# Patient Record
Sex: Female | Born: 1950 | Race: White | Hispanic: No | Smoking: Never smoker
Health system: Southern US, Community
[De-identification: ages and names within clinical notes are randomized; demographics above are authoritative.]

## PROBLEM LIST (undated history)

## (undated) DIAGNOSIS — I10 Essential (primary) hypertension: Secondary | ICD-10-CM

## (undated) DIAGNOSIS — I639 Cerebral infarction, unspecified: Secondary | ICD-10-CM

## (undated) DIAGNOSIS — M6740 Ganglion, unspecified site: Secondary | ICD-10-CM

## (undated) DIAGNOSIS — R569 Unspecified convulsions: Secondary | ICD-10-CM

## (undated) DIAGNOSIS — E119 Type 2 diabetes mellitus without complications: Secondary | ICD-10-CM

## (undated) HISTORY — PX: TONSILLECTOMY: SUR1361

## (undated) HISTORY — PX: ABDOMINAL HYSTERECTOMY: SHX81

## (undated) HISTORY — PX: APPENDECTOMY: SHX54

---

## 2015-02-26 ENCOUNTER — Emergency Department (HOSPITAL_BASED_OUTPATIENT_CLINIC_OR_DEPARTMENT_OTHER)
Admission: EM | Admit: 2015-02-26 | Discharge: 2015-02-26 | Disposition: A | Discharge status: Home / Self Care | Payer: Medicare Other | Attending: Emergency Medicine | Admitting: Emergency Medicine

## 2015-02-26 ENCOUNTER — Emergency Department (HOSPITAL_BASED_OUTPATIENT_CLINIC_OR_DEPARTMENT_OTHER): Payer: Medicare Other

## 2015-02-26 ENCOUNTER — Encounter (HOSPITAL_BASED_OUTPATIENT_CLINIC_OR_DEPARTMENT_OTHER): Payer: Self-pay | Admitting: *Deleted

## 2015-02-26 DIAGNOSIS — W01198A Fall on same level from slipping, tripping and stumbling with subsequent striking against other object, initial encounter: Secondary | ICD-10-CM | POA: Diagnosis not present

## 2015-02-26 DIAGNOSIS — Y9289 Other specified places as the place of occurrence of the external cause: Secondary | ICD-10-CM | POA: Insufficient documentation

## 2015-02-26 DIAGNOSIS — I10 Essential (primary) hypertension: Secondary | ICD-10-CM | POA: Diagnosis not present

## 2015-02-26 DIAGNOSIS — Z880 Allergy status to penicillin: Secondary | ICD-10-CM | POA: Diagnosis not present

## 2015-02-26 DIAGNOSIS — S7001XA Contusion of right hip, initial encounter: Secondary | ICD-10-CM | POA: Diagnosis not present

## 2015-02-26 DIAGNOSIS — Z7902 Long term (current) use of antithrombotics/antiplatelets: Secondary | ICD-10-CM

## 2015-02-26 DIAGNOSIS — D691 Qualitative platelet defects: Secondary | ICD-10-CM | POA: Insufficient documentation

## 2015-02-26 DIAGNOSIS — Z79899 Other long term (current) drug therapy: Secondary | ICD-10-CM | POA: Insufficient documentation

## 2015-02-26 DIAGNOSIS — Z8739 Personal history of other diseases of the musculoskeletal system and connective tissue: Secondary | ICD-10-CM | POA: Diagnosis not present

## 2015-02-26 DIAGNOSIS — Z8673 Personal history of transient ischemic attack (TIA), and cerebral infarction without residual deficits: Secondary | ICD-10-CM | POA: Insufficient documentation

## 2015-02-26 DIAGNOSIS — S0003XA Contusion of scalp, initial encounter: Secondary | ICD-10-CM | POA: Diagnosis not present

## 2015-02-26 DIAGNOSIS — S0990XA Unspecified injury of head, initial encounter: Secondary | ICD-10-CM | POA: Diagnosis present

## 2015-02-26 DIAGNOSIS — S20221A Contusion of right back wall of thorax, initial encounter: Secondary | ICD-10-CM | POA: Diagnosis not present

## 2015-02-26 DIAGNOSIS — Y998 Other external cause status: Secondary | ICD-10-CM | POA: Insufficient documentation

## 2015-02-26 DIAGNOSIS — Z794 Long term (current) use of insulin: Secondary | ICD-10-CM | POA: Insufficient documentation

## 2015-02-26 DIAGNOSIS — W19XXXA Unspecified fall, initial encounter: Secondary | ICD-10-CM

## 2015-02-26 DIAGNOSIS — R296 Repeated falls: Secondary | ICD-10-CM

## 2015-02-26 DIAGNOSIS — S161XXA Strain of muscle, fascia and tendon at neck level, initial encounter: Secondary | ICD-10-CM | POA: Insufficient documentation

## 2015-02-26 DIAGNOSIS — Y9389 Activity, other specified: Secondary | ICD-10-CM | POA: Diagnosis not present

## 2015-02-26 DIAGNOSIS — E119 Type 2 diabetes mellitus without complications: Secondary | ICD-10-CM | POA: Diagnosis not present

## 2015-02-26 HISTORY — DX: Type 2 diabetes mellitus without complications: E11.9

## 2015-02-26 HISTORY — DX: Cerebral infarction, unspecified: I63.9

## 2015-02-26 HISTORY — DX: Essential (primary) hypertension: I10

## 2015-02-26 HISTORY — DX: Ganglion, unspecified site: M67.40

## 2015-02-26 HISTORY — DX: Unspecified convulsions: R56.9

## 2015-02-26 NOTE — ED Notes (Signed)
Patient assisted with preparing for discharge.

## 2015-02-26 NOTE — ED Notes (Signed)
She tripped and fell this am. She lives in an assisted living and uses a walker to ambulate. She hit the back of her head and has a hematoma. She is on blood thinners. She is alert oriented. Pain in her lower back and numbness in her right leg.

## 2015-02-26 NOTE — ED Provider Notes (Signed)
CSN: [id number]     Arrival date & time 02/26/15  1411 History   First MD Initiated Contact with Patient 02/26/15 1531     Chief Complaint  Patient presents with  . Head Injury     (Consider location/radiation/quality/duration/timing/severity/associated sxs/prior Treatment) HPI Patient has had frequent falls. She is living in an assisted living. Her falls often occur during the night when she tried use of aspirin. That happened last night at about 2 AM. EMS came to the site but the patient did not wish to have transport. She was evaluated and had not had loss of consciousness and was alert and oriented. This morning however due to the patient being on Plavix, the nursing facility did request she would seek evaluation. She reports she has a tender swelling on the back of her head and some pain in her right hip. She reports it has felt more difficult to walk but she has been able to move it and to transfer her weight. Past Medical History  Diagnosis Date  . Diabetes mellitus without complication (HCC)   . Ganglion cyst   . Hypertension   . Stroke (HCC)   . Seizures [facility])    Past Surgical History  Procedure Laterality Date  . Abdominal hysterectomy    . Appendectomy    . Tonsillectomy     No family history on file. Social History  Substance Use Topics  . Smoking status: Never Smoker   . Smokeless tobacco: None  . Alcohol Use: No   OB History    No data available     Review of Systems 10 Systems reviewed and are negative for acute change except as noted in the HPI.    Allergies  Mercury and Penicillins  Home Medications   Prior to Admission medications   Medication Sig Start Date End Date Taking? Authorizing Provider  ALPRAZolam Prudy Feeler) 0.5 MG tablet Take 0.5 mg by mouth at bedtime as needed for anxiety.   Yes Historical Provider, MD  buPROPion (WELLBUTRIN SR) 150 MG 12 hr tablet Take 150 mg by mouth 2 (two) times daily.   Yes Historical Provider, MD  carvedilol (COREG)  12.5 MG tablet Take 12.5 mg by mouth 2 (two) times daily with a meal.   Yes Historical Provider, MD  clopidogrel (PLAVIX) 75 MG tablet Take 75 mg by mouth daily.   Yes Historical Provider, MD  docusate sodium (COLACE) 100 MG capsule Take 100 mg by mouth 2 (two) times daily.   Yes Historical Provider, MD  DULoxetine HCl (CYMBALTA PO) Take by mouth.   Yes Historical Provider, MD  esomeprazole (NEXIUM) 20 MG capsule Take 20 mg by mouth daily at 12 noon.   Yes Historical Provider, MD  ezetimibe (ZETIA) 10 MG tablet Take 10 mg by mouth daily.   Yes Historical Provider, MD  folic acid (FOLVITE) 1 MG tablet Take 1 mg by mouth daily.   Yes Historical Provider, MD  insulin aspart (NOVOLOG) 100 UNIT/ML injection Inject into the skin 3 (three) times daily before meals.   Yes Historical Provider, MD  insulin glargine (LANTUS) 100 UNIT/ML injection Inject into the skin at bedtime.   Yes Historical Provider, MD  LamoTRIgine (LAMICTAL PO) Take by mouth.   Yes Historical Provider, MD  lisinopril (PRINIVIL,ZESTRIL) 10 MG tablet Take 10 mg by mouth daily.   Yes Historical Provider, MD  Methotrexate, PF, 25 MG/0.5ML SOAJ Inject into the skin.   Yes Historical Provider, MD  morphine (MSIR) 15 MG tablet Take 15 mg  by mouth every 4 (four) hours as needed for severe pain.   Yes Historical Provider, MD  NYSTATIN PO Take by mouth.   Yes Historical Provider, MD  polyethylene glycol (MIRALAX / GLYCOLAX) packet Take 17 g by mouth daily.   Yes Historical Provider, MD  pregabalin (LYRICA) 50 MG capsule Take 50 mg by mouth 3 (three) times daily.   Yes Historical Provider, MD  traZODone (DESYREL) 100 MG tablet Take 100 mg by mouth at bedtime.   Yes Historical Provider, MD   There were no vitals taken for this visit. Physical Exam  Constitutional: She is oriented to person, place, and time.  Patient is alert and nontoxic. No respiratory distress. He is mildly deconditioned and has mild obesity.  HENT:  Right Ear: External ear  normal.  Left Ear: External ear normal.  Nose: Nose normal.  Mouth/Throat: Oropharynx is clear and moist.  2 cm hematoma to the right posterior scalp. No laceration.  Eyes: EOM are normal. Pupils are equal, round, and reactive to light.  Neck: Neck supple.  Cervical spine tender at the base of the skull close to the area of hematoma.  Cardiovascular: Normal rate, regular rhythm, normal heart sounds and intact distal pulses.   Pulmonary/Chest: Effort normal and breath sounds normal.  Thoracic back has a linear approximately 15 cm purple, obliquely oriented contusion. This is not significant tender. Patient also has a large area of deep ecchymoses around the posterior, right under arm. Again no significant tenderness. Patient was not aware of these areas of bruising.  Abdominal: Soft. Bowel sounds are normal. She exhibits no distension. There is no tenderness.  Musculoskeletal: Normal range of motion. She exhibits tenderness. She exhibits no edema.  Patient endorses tenderness to palpation over the right greater trochanter. Range of motion is however intact.  Neurological: She is alert and oriented to person, place, and time. She has normal strength. No cranial nerve deficit. She exhibits normal muscle tone. Coordination normal. GCS eye subscore is 4. GCS verbal subscore is 5. GCS motor subscore is 6.  Skin: Skin is warm, dry and intact.  Psychiatric: She has a normal mood and affect.    ED Course  Procedures (including critical care time) Labs Review Labs Reviewed - No data to display  Imaging Review Ct Head Wo Contrast  02/26/2015  CLINICAL DATA:  Fall on Plavix with posterior head injury. EXAM: CT HEAD WITHOUT CONTRAST CT CERVICAL SPINE WITHOUT CONTRAST TECHNIQUE: Multidetector CT imaging of the head and cervical spine was performed following the standard protocol without intravenous contrast. Multiplanar CT image reconstructions of the cervical spine were also generated. COMPARISON:  None.  FINDINGS: CT HEAD FINDINGS No evidence of parenchymal hemorrhage or extra-axial fluid collection. No mass lesion, mass effect, or midline shift. No CT evidence of acute infarction. Intracranial atherosclerosis. Nonspecific prominent subcortical and periventricular white matter hypodensity, most in keeping with chronic small vessel ischemic change. There is diffuse cerebral volume loss, advanced for age. There is encephalomalacia in the left visual cortex, lateral left cerebellar hemisphere and right lower midbrain. Small lacunar infarct are noted in the left corona radiata and bilateral basal ganglia. No ventriculomegaly. The visualized paranasal sinuses are essentially clear. The mastoid air cells are unopacified. No evidence of calvarial fracture. CT CERVICAL SPINE FINDINGS No fracture is detected in the cervical spine. No prevertebral soft tissue swelling. There is mild straightening of the cervical spine, usually due to positioning and/or muscle spasm. Dens is well positioned between the lateral masses of C1.  The lateral masses appear well-aligned. Moderate to severe degenerative disc disease in the lower cervical spine, most prominent at C5-6 and C6-7. Severe bilateral facet arthropathy. No significant cervical foraminal stenosis. There is 3 mm retrolisthesis at C5-6. Otherwise no subluxation. Visualized mastoid air cells appear clear. No evidence of intra-axial hemorrhage in the visualized brain. No gross cervical canal hematoma. Mosaic attenuation at the lung apices, a nonspecific finding but could reflect mild pulmonary edema, mosaic profusion or air trapping. No cervical adenopathy or other significant neck soft tissue abnormality. IMPRESSION: 1. No evidence of acute intracranial abnormality. No calvarial fracture. 2. Intracranial atherosclerosis, age advanced diffuse cerebral volume loss, prominent chronic small vessel ischemic white matter change and multiple remote infarcts involving multiple vascular  territories as described. 3. No cervical spine fracture. 4. Moderate to severe degenerative disc disease in the lower cervical spine. Severe bilateral facet arthropathy. Minimal 3 mm retrolisthesis at C5-6 is likely degenerative in etiology. Otherwise no cervical spine subluxation. Electronically Signed   By: Delbert PhenixJason A Poff M.D.   On: 02/26/2015 16:42   Ct Cervical Spine Wo Contrast  02/26/2015  CLINICAL DATA:  Fall on Plavix with posterior head injury. EXAM: CT HEAD WITHOUT CONTRAST CT CERVICAL SPINE WITHOUT CONTRAST TECHNIQUE: Multidetector CT imaging of the head and cervical spine was performed following the standard protocol without intravenous contrast. Multiplanar CT image reconstructions of the cervical spine were also generated. COMPARISON:  None. FINDINGS: CT HEAD FINDINGS No evidence of parenchymal hemorrhage or extra-axial fluid collection. No mass lesion, mass effect, or midline shift. No CT evidence of acute infarction. Intracranial atherosclerosis. Nonspecific prominent subcortical and periventricular white matter hypodensity, most in keeping with chronic small vessel ischemic change. There is diffuse cerebral volume loss, advanced for age. There is encephalomalacia in the left visual cortex, lateral left cerebellar hemisphere and right lower midbrain. Small lacunar infarct are noted in the left corona radiata and bilateral basal ganglia. No ventriculomegaly. The visualized paranasal sinuses are essentially clear. The mastoid air cells are unopacified. No evidence of calvarial fracture. CT CERVICAL SPINE FINDINGS No fracture is detected in the cervical spine. No prevertebral soft tissue swelling. There is mild straightening of the cervical spine, usually due to positioning and/or muscle spasm. Dens is well positioned between the lateral masses of C1. The lateral masses appear well-aligned. Moderate to severe degenerative disc disease in the lower cervical spine, most prominent at C5-6 and C6-7. Severe  bilateral facet arthropathy. No significant cervical foraminal stenosis. There is 3 mm retrolisthesis at C5-6. Otherwise no subluxation. Visualized mastoid air cells appear clear. No evidence of intra-axial hemorrhage in the visualized brain. No gross cervical canal hematoma. Mosaic attenuation at the lung apices, a nonspecific finding but could reflect mild pulmonary edema, mosaic profusion or air trapping. No cervical adenopathy or other significant neck soft tissue abnormality. IMPRESSION: 1. No evidence of acute intracranial abnormality. No calvarial fracture. 2. Intracranial atherosclerosis, age advanced diffuse cerebral volume loss, prominent chronic small vessel ischemic white matter change and multiple remote infarcts involving multiple vascular territories as described. 3. No cervical spine fracture. 4. Moderate to severe degenerative disc disease in the lower cervical spine. Severe bilateral facet arthropathy. Minimal 3 mm retrolisthesis at C5-6 is likely degenerative in etiology. Otherwise no cervical spine subluxation. Electronically Signed   By: Delbert PhenixJason A Poff M.D.   On: 02/26/2015 16:42   Dg Hip Unilat With Pelvis 2-3 Views Right  02/26/2015  CLINICAL DATA:  65 year old female with a history of fall and right hip pain EXAM:  DG HIP (WITH OR WITHOUT PELVIS) 2-3V RIGHT COMPARISON:  None. FINDINGS: Bony pelvic ring appears intact with no acute fracture identified. Osteopenia. Bilateral hips projects normally over the acetabula. Proximal left femur unremarkable. Unremarkable appearance the proximal right femur. 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 Pelvic phleboliths. Degenerative changes of the lumbar spine. IMPRESSION: Negative for acute fracture. Signed, Yvone Neu. Loreta Ave, DO [facility] Electronically Signed   By: Gilmer Mor D.O.   On: 02/26/2015 16:37   I have personally reviewed and evaluated these images and lab results as part  of my medical decision-making.   EKG Interpretation None      MDM   Final diagnoses:  Fall, initial encounter  Scalp hematoma, initial encounter  Cervical strain, initial encounter  Contusion of right hip, initial encounter  Back contusion, right, initial encounter  Platelet inhibition due to Plavix  Frequent falls   Patient has pre-existing history of frequent falls. She is anticoagulated with Plavix. She was referred to the emergency department for evaluation of a scalp hematoma from fall yesterday evening. There has not Been any mental status change and neurologic function is at baseline. CT scan does not show any intracranial hemorrhage or acute cervical spine injury. Although patient does have significant bruising, no other area of fracture is identified. X-ray does not show hip fracture and range of motion is intact without pain. At this time I doubt occult fracture.   Arby Barrette, MD 02/26/15 [phone]

## 2015-02-26 NOTE — ED Notes (Signed)
Escorted Pt. To restroom and encouraged Pt. To exercise her legs while lying in the bed

## 2015-02-26 NOTE — Discharge Instructions (Signed)
Head Injury, Adult °You have received a head injury. It does not appear serious at this time. Headaches and vomiting are common following head injury. It should be easy to awaken from sleeping. Sometimes it is necessary for you to stay in the emergency department for a while for observation. Sometimes admission to the hospital may be needed. After injuries such as yours, most problems occur within the first 24 hours, but side effects may occur up to 7-10 days after the injury. It is important for you to carefully monitor your condition and contact your health care provider or seek immediate medical care if there is a change in your condition. °WHAT ARE THE TYPES OF HEAD INJURIES? °Head injuries can be as minor as a bump. Some head injuries can be more severe. More severe head injuries include: °· A jarring injury to the brain (concussion). °· A bruise of the brain (contusion). This mean there is bleeding in the brain that can cause swelling. °· A cracked skull (skull fracture). °· Bleeding in the brain that collects, clots, and forms a bump (hematoma). °WHAT CAUSES A HEAD INJURY? °A serious head injury is most likely to happen to someone who is in a car wreck and is not wearing a seat belt. Other causes of major head injuries include bicycle or motorcycle accidents, sports injuries, and falls. °HOW ARE HEAD INJURIES DIAGNOSED? °A complete history of the event leading to the injury and your current symptoms will be helpful in diagnosing head injuries. Many times, pictures of the brain, such as CT or MRI are needed to see the extent of the injury. Often, an overnight hospital stay is necessary for observation.  °WHEN SHOULD I SEEK IMMEDIATE MEDICAL CARE?  °You should get help right away if: °· You have confusion or drowsiness. °· You feel sick to your stomach (nauseous) or have continued, forceful vomiting. °· You have dizziness or unsteadiness that is getting worse. °· You have severe, continued headaches not  relieved by medicine. Only take over-the-counter or prescription medicines for pain, fever, or discomfort as directed by your health care provider. °· You do not have normal function of the arms or legs or are unable to walk. °· You notice changes in the black spots in the center of the colored part of your eye (pupil). °· You have a clear or bloody fluid coming from your nose or ears. °· You have a loss of vision. °During the next 24 hours after the injury, you must stay with someone who can watch you for the warning signs. This person should contact local emergency services (911 in the [place] if you have seizures, you become unconscious, or you are unable to wake up. °HOW CAN I PREVENT A HEAD INJURY IN THE FUTURE? °The most important factor for preventing major head injuries is avoiding motor vehicle accidents.  To minimize the potential for damage to your head, it is crucial to wear seat belts while riding in motor vehicles. Wearing helmets while bike riding and playing collision sports (like football) is also helpful. Also, avoiding dangerous activities around the house will further help reduce your risk of head injury.  °WHEN CAN I RETURN TO NORMAL ACTIVITIES AND ATHLETICS? °You should be reevaluated by your health care provider before returning to these activities. If you have any of the following symptoms, you should not return to activities or contact sports until 1 week after the symptoms have stopped: °· Persistent headache. °· Dizziness or vertigo. °· Poor attention and concentration. °· Confusion. °·   Memory problems. °· Nausea or vomiting. °· Fatigue or tire easily. °· Irritability. °· Intolerant of bright lights or loud noises. °· Anxiety or depression. °· Disturbed sleep. °MAKE SURE YOU:  °· Understand these instructions. °· Will watch your condition. °· Will get help right away if you are not doing well or get worse. °  °This information is not intended to replace advice given to you by your health  care provider. Make sure you discuss any questions you have with your health care provider. °  °Document Released: 02/09/2005 Document Revised: 03/02/2014 Document Reviewed: 10/17/2012 °Elsevier Interactive Patient Education ©2016 [employer] ° ° ° °Hematoma °A hematoma is a collection of blood under the skin, in an organ, in a body space, in a joint space, or in other tissue. The blood can clot to form a lump that you can see and feel. The lump is often firm and may sometimes become sore and tender. Most hematomas get better in a few days to weeks. However, some hematomas may be serious and require medical care. Hematomas can range in size from very small to very large. °CAUSES  °A hematoma can be caused by a blunt or penetrating injury. It can also be caused by spontaneous leakage from a blood vessel under the skin. Spontaneous leakage from a blood vessel is more likely to occur in older people, especially those taking blood thinners. Sometimes, a hematoma can develop after certain medical procedures. °SIGNS AND SYMPTOMS  °· A firm lump on the body. °· Possible pain and tenderness in the area. °· Bruising. Blue, dark blue, purple-red, or yellowish skin may appear at the site of the hematoma if the hematoma is close to the surface of the skin. °For hematomas in deeper tissues or body spaces, the signs and symptoms may be subtle. For example, an intra-abdominal hematoma may cause abdominal pain, weakness, fainting, and shortness of breath. An intracranial hematoma may cause a headache or symptoms such as weakness, trouble speaking, or a change in consciousness. °DIAGNOSIS  °A hematoma can usually be diagnosed based on your medical history and a physical exam. Imaging tests may be needed if your health care provider suspects a hematoma in deeper tissues or body spaces, such as the abdomen, head, or chest. These tests may include ultrasonography or a CT scan.  °TREATMENT  °Hematomas usually go away on their own over  time. Rarely does the blood need to be drained out of the body. Large hematomas or those that may affect vital organs will sometimes need surgical drainage or monitoring. °HOME CARE INSTRUCTIONS  °· Apply ice to the injured area:   °¨ Put ice in a plastic bag.   °¨ Place a towel between your skin and the bag.   °¨ Leave the ice on for 20 minutes, 2-3 times a day for the first 1 to 2 days.   °· After the first 2 days, switch to using warm compresses on the hematoma.   °· Elevate the injured area to help decrease pain and swelling. Wrapping the area with an elastic bandage may also be helpful. Compression helps to reduce swelling and promotes shrinking of the hematoma. Make sure the bandage is not wrapped too tight.   °· If your hematoma is on a lower extremity and is painful, crutches may be helpful for a couple days.   °· Only take over-the-counter or prescription medicines as directed by your health care provider. °SEEK IMMEDIATE MEDICAL CARE IF:  °· You have increasing pain, or your pain is not controlled with medicine.   °·   You have a fever.   °· You have worsening swelling or discoloration.   °· Your skin over the hematoma breaks or starts bleeding.   °· Your hematoma is in your chest or abdomen and you have weakness, shortness of breath, or a change in consciousness. °· Your hematoma is on your scalp (caused by a fall or injury) and you have a worsening headache or a change in alertness or consciousness. °MAKE SURE YOU:  °· Understand these instructions. °· Will watch your condition. °· Will get help right away if you are not doing well or get worse. °  °This information is not intended to replace advice given to you by your health care provider. Make sure you discuss any questions you have with your health care provider. °  °Document Released: 09/24/2003 Document Revised: 10/12/2012 Document Reviewed: 07/20/2012 °Elsevier Interactive Patient Education ©2016 [employer] ° °

## 2016-07-04 IMAGING — DX DG HIP (WITH OR WITHOUT PELVIS) 2-3V*R*
3 series · 3 of 3 positions shown · non-contrast
Comparison: None.

CLINICAL DATA: 64-year-old female with a history of fall and right
hip pain

EXAM:
DG HIP (WITH OR WITHOUT PELVIS) 2-3V RIGHT

[pelvis ap]
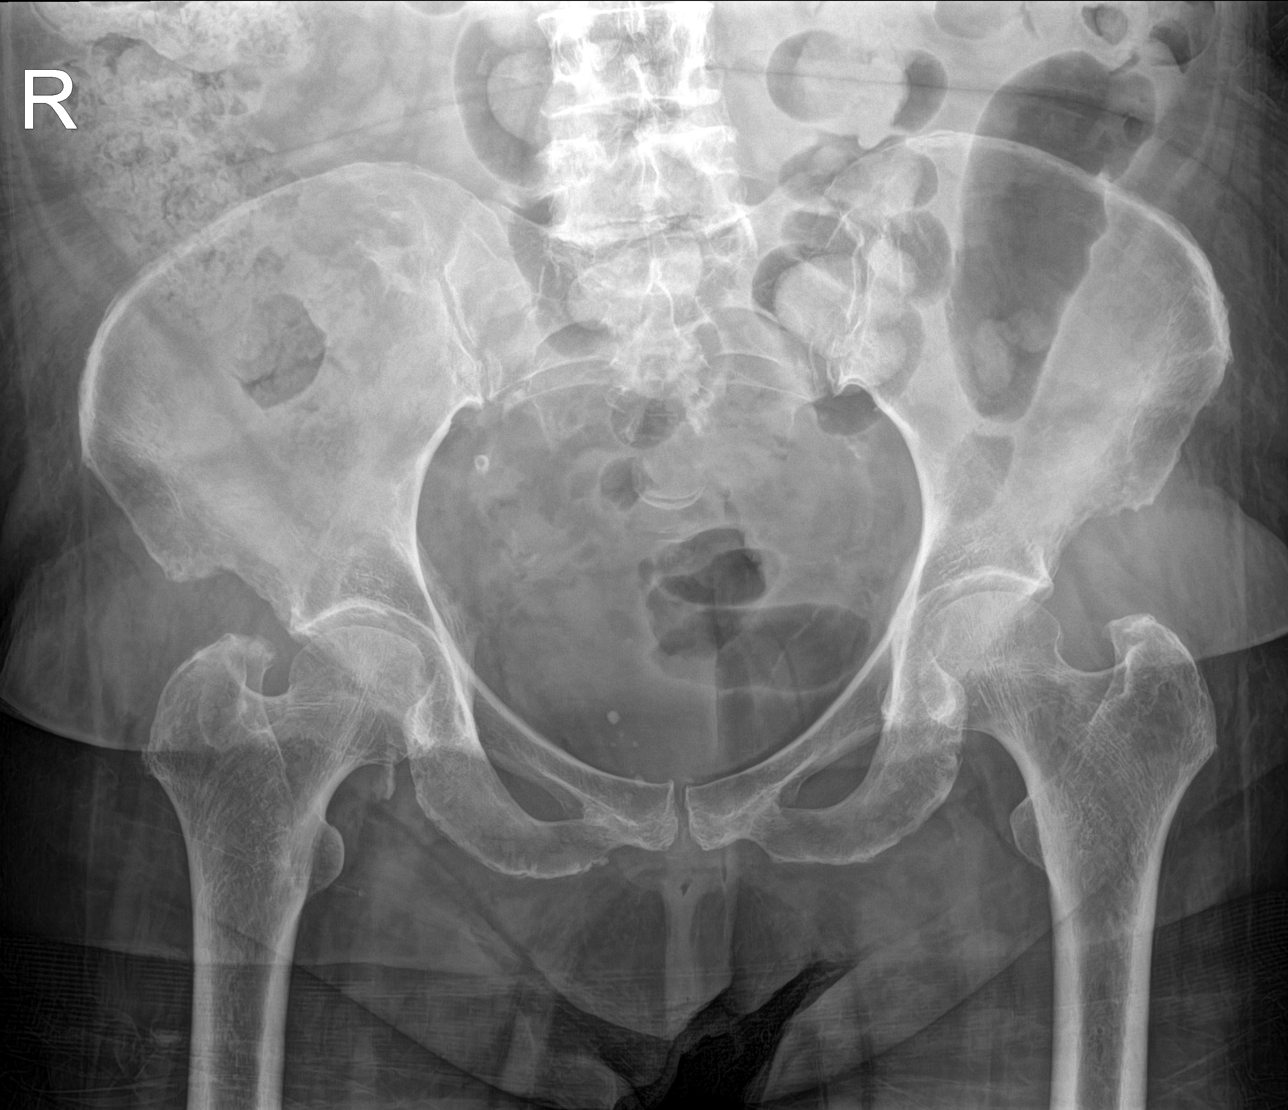

[hip ap]
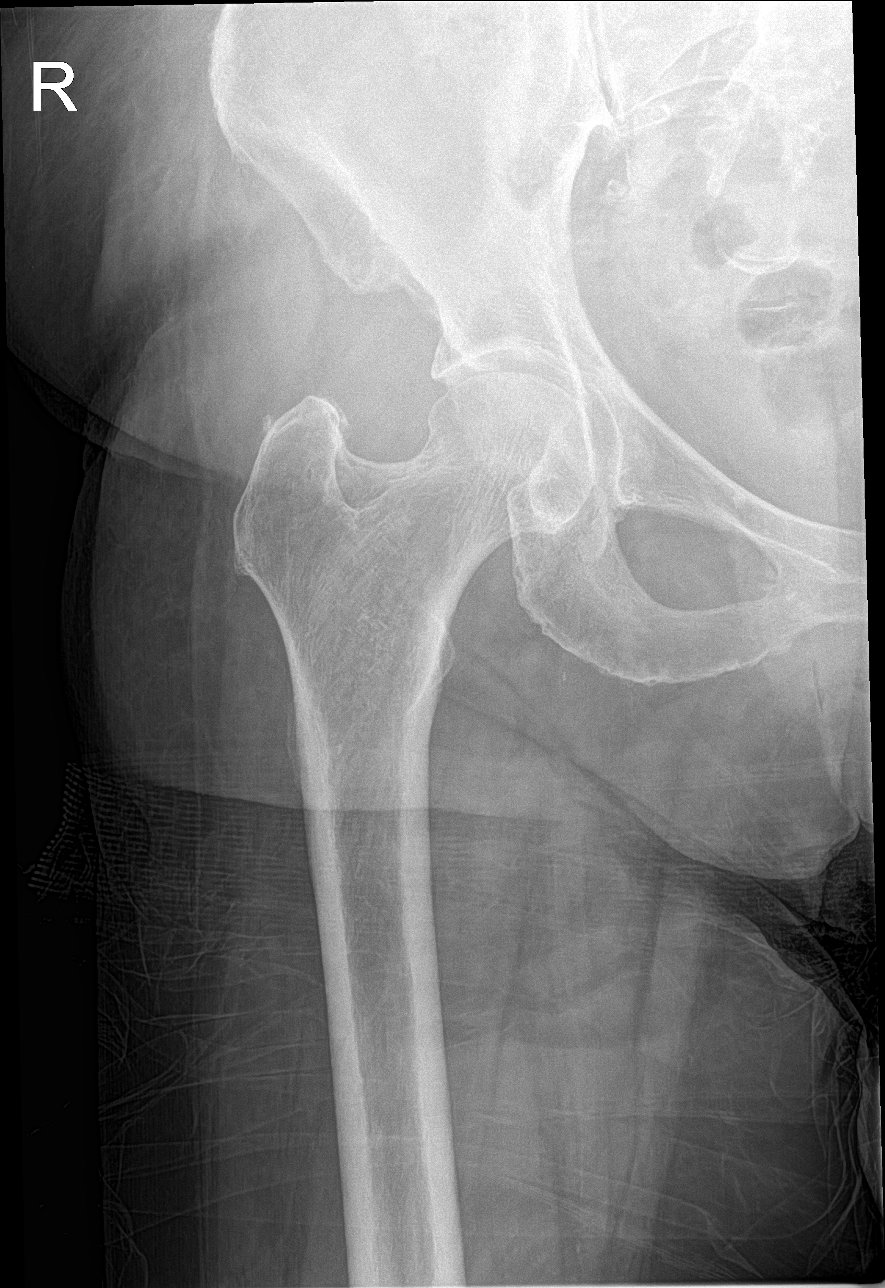

[hip lat]
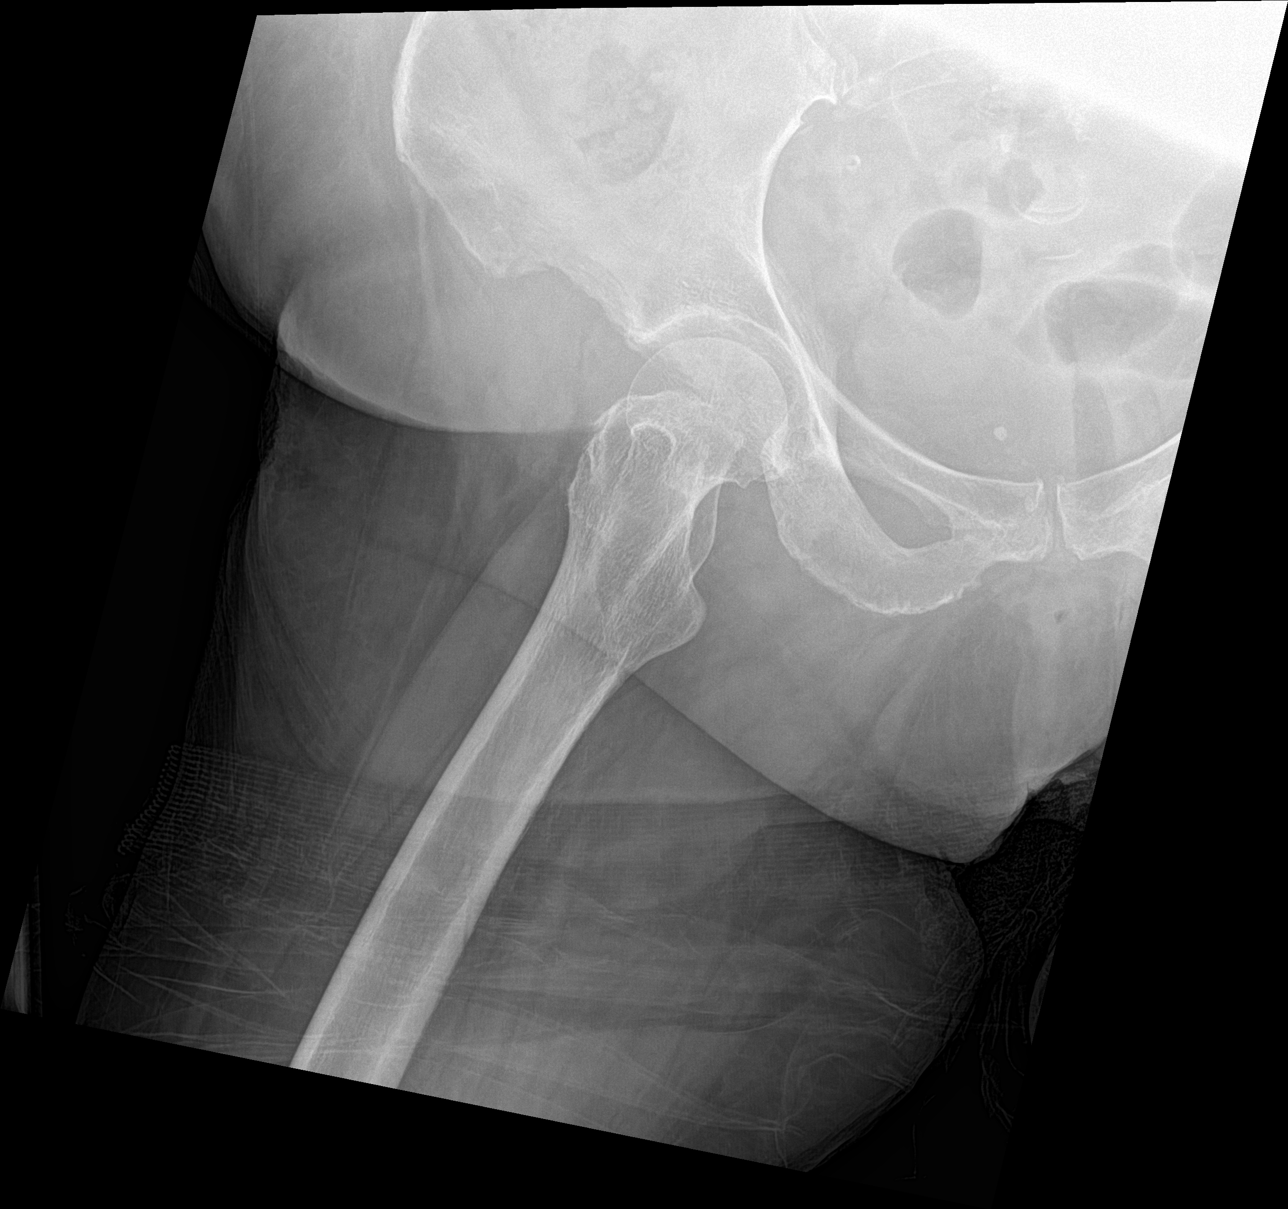

[3 of 3 positions shown; findings below may reference images not displayed]

FINDINGS: Bony pelvic ring appears intact with no acute fracture identified.

Osteopenia.

Bilateral hips projects normally over the acetabula.

Proximal left femur unremarkable.

Unremarkable appearance the proximal right femur.

Bilateral changes of osteoarthritis at the hips.

Pelvic phleboliths.

Degenerative changes of the lumbar spine.
IMPRESSION: Negative for acute fracture.

## 2016-07-04 IMAGING — CT CT CERVICAL SPINE W/O CM
4 of 6 series · 12 of 33 positions shown, 14 images · non-contrast
Comparison: None.

CLINICAL DATA: Fall on Plavix with posterior head injury.

EXAM:
CT HEAD WITHOUT CONTRAST
CT CERVICAL SPINE WITHOUT CONTRAST
TECHNIQUE: Multidetector CT imaging of the head and cervical spine was
performed following the standard protocol without intravenous
contrast. Multiplanar CT image reconstructions of the cervical spine
were also generated.

[Series 5: c spine soft · axial · 0.22mm/px · z∈[-273,-221]mm · 2 of 105 slices shown]
[im 27/105  soft-tissue]
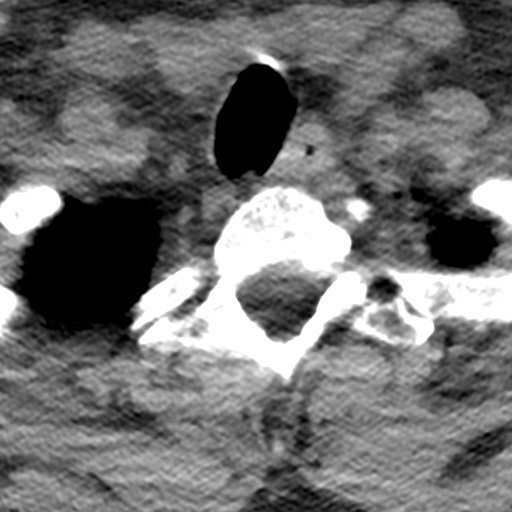
[im 53/105  soft-tissue]
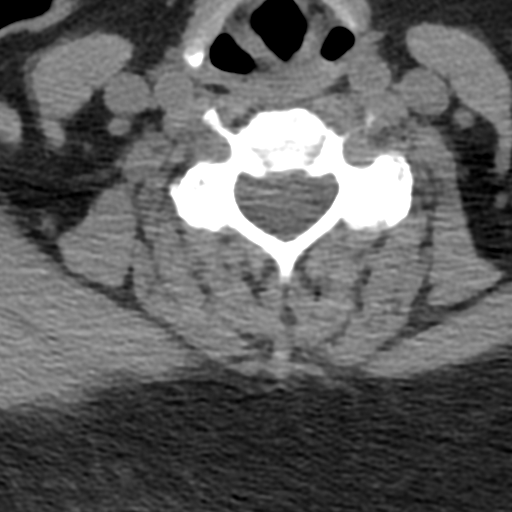

[Series 6: sagittal bone · sagittal · 0.27mm/px · 5 of 50 slices shown, 6 images]
[im 17/50  bone]
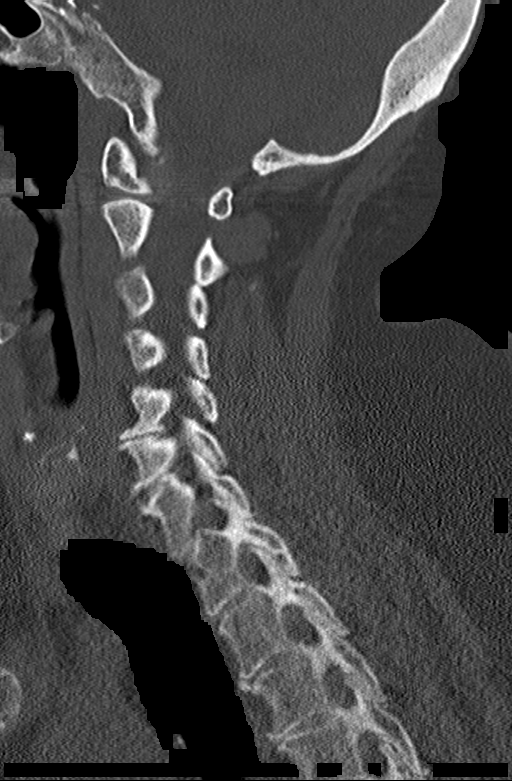
[im 21/50  bone]
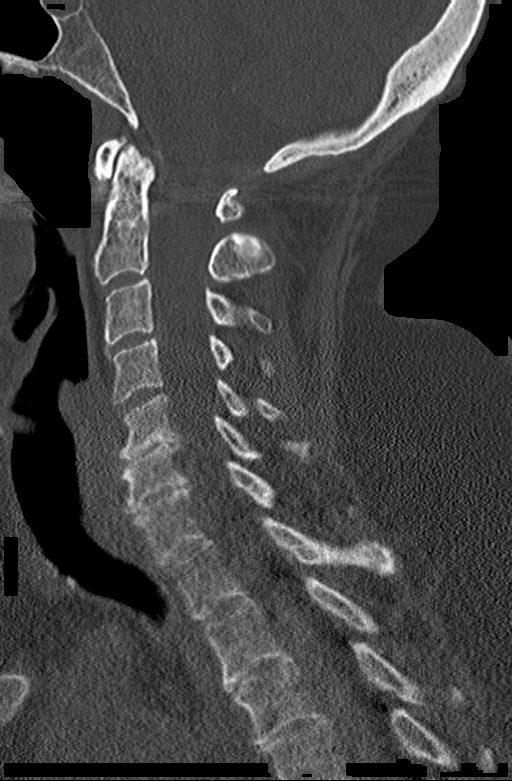
[im 25/50  soft-tissue]
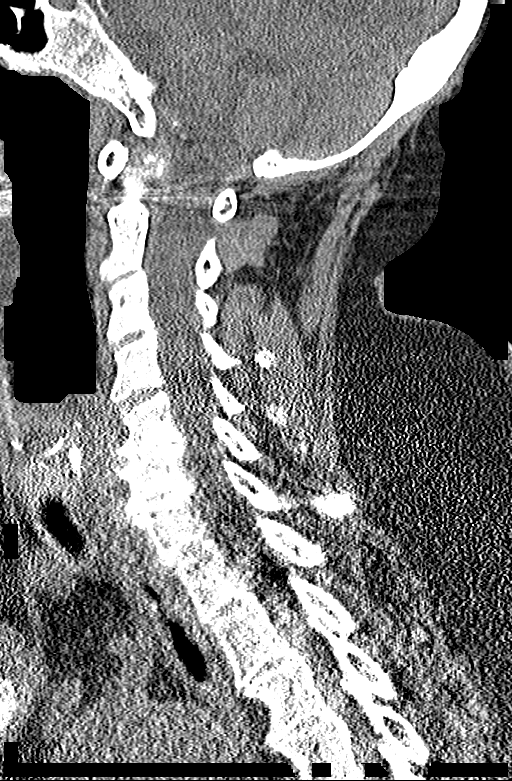
[im 25/50  bone]
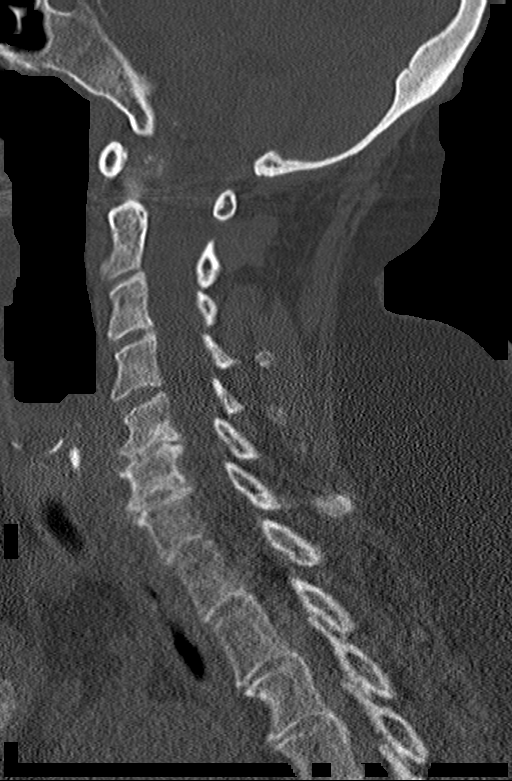
[im 29/50  bone]
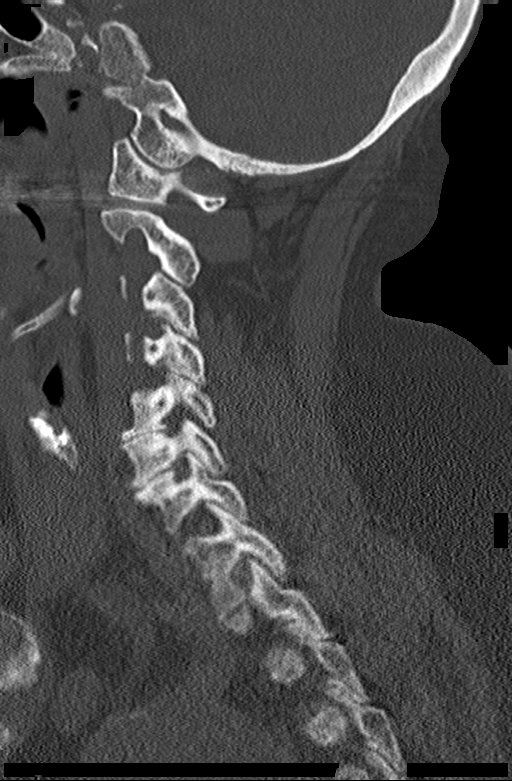
[im 33/50  bone]
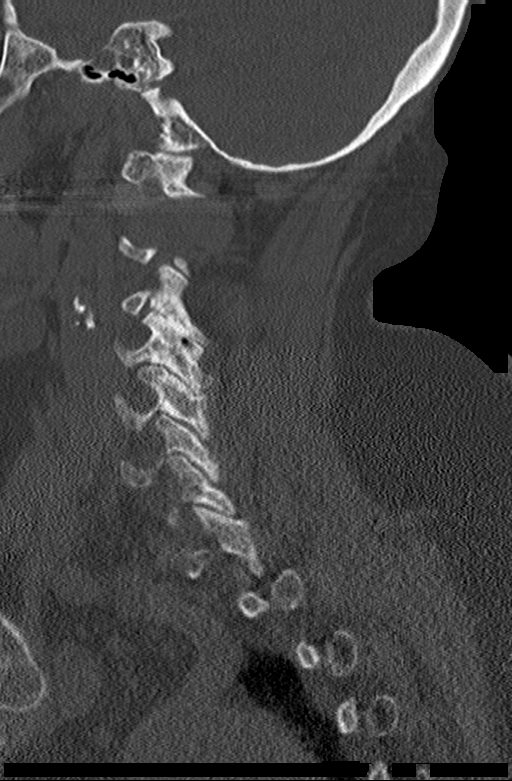

[Series 7: coronal bone · coronal · 0.30mm/px · 3 of 56 slices shown]
[im 12/56  bone]
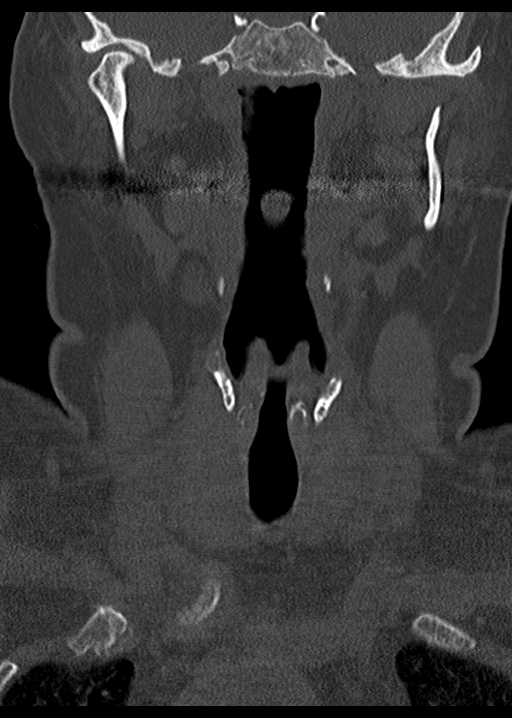
[im 23/56  bone]
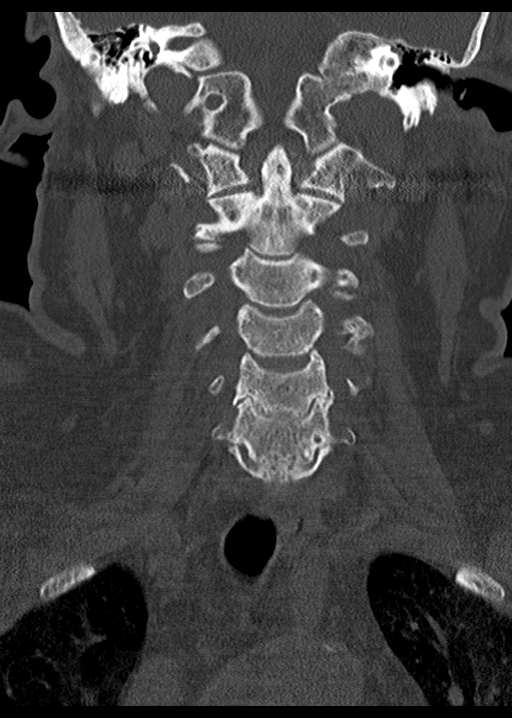
[im 34/56  bone]
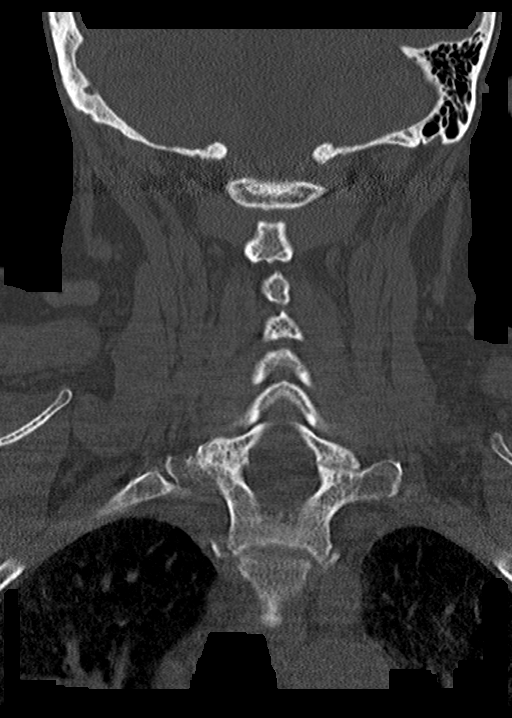

[Series 8: orthogonal axials · axial · 0.25mm/px · z∈[-264,-202]mm · 2 of 96 slices shown, 3 images]
[im 32/96  soft-tissue]
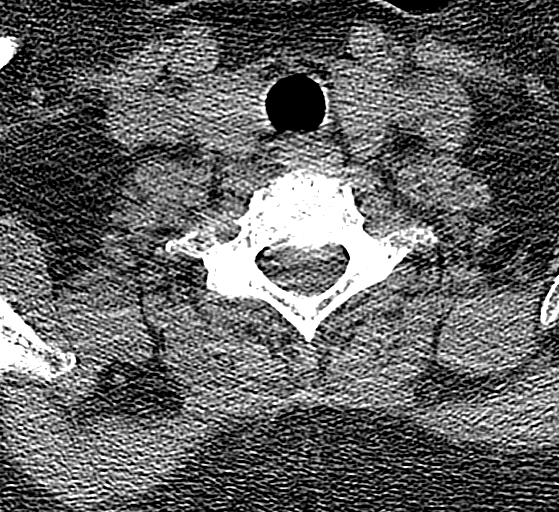
[im 32/96  bone]
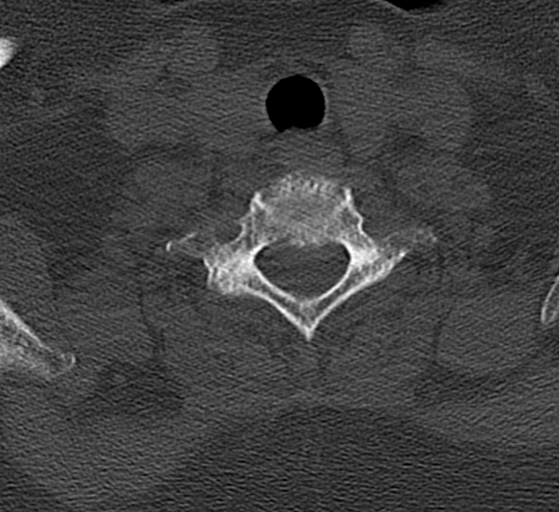
[im 64/96  bone]
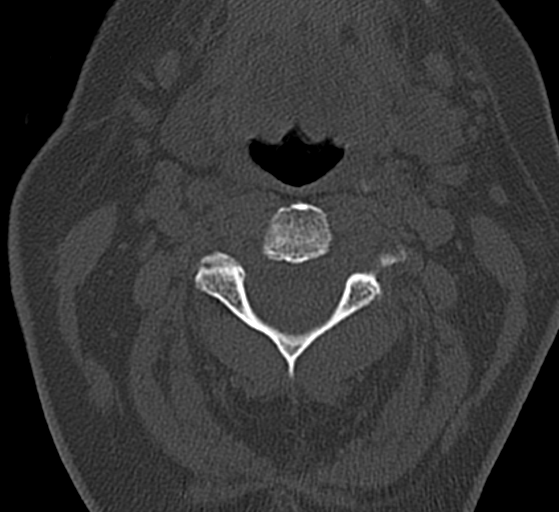

[12 of 33 positions shown; findings below may reference images not displayed]

FINDINGS: CT HEAD FINDINGS

No evidence of parenchymal hemorrhage or extra-axial fluid
collection. No mass lesion, mass effect, or midline shift.

No CT evidence of acute infarction. Intracranial atherosclerosis.
Nonspecific prominent subcortical and periventricular white matter
hypodensity, most in keeping with chronic small vessel ischemic
change.

There is diffuse cerebral volume loss, advanced for age. There is
encephalomalacia in the left visual cortex, lateral left cerebellar
hemisphere and right lower midbrain. Small lacunar infarct are noted
in the left corona radiata and bilateral basal ganglia. No
ventriculomegaly.

The visualized paranasal sinuses are essentially clear. The mastoid
air cells are unopacified. No evidence of calvarial fracture.

CT CERVICAL SPINE FINDINGS

No fracture is detected in the cervical spine. No prevertebral soft
tissue swelling. There is mild straightening of the cervical spine,
usually due to positioning and/or muscle spasm. Dens is well
positioned between the lateral masses of C1. The lateral masses
appear well-aligned. Moderate to severe degenerative disc disease in
the lower cervical spine, most prominent at C5-6 and C6-7. Severe
bilateral facet arthropathy. No significant cervical foraminal
stenosis. There is 3 mm retrolisthesis at C5-6. Otherwise no
subluxation.

Visualized mastoid air cells appear clear. No evidence of
intra-axial hemorrhage in the visualized brain. No gross cervical
canal hematoma. Mosaic attenuation at the lung apices, a nonspecific
finding but could reflect mild pulmonary edema, mosaic profusion or
air trapping. No cervical adenopathy or other significant neck soft
tissue abnormality.
IMPRESSION: 1. No evidence of acute intracranial abnormality. No calvarial
fracture.
2. Intracranial atherosclerosis, age advanced diffuse cerebral
volume loss, prominent chronic small vessel ischemic white matter
change and multiple remote infarcts involving multiple vascular
territories as described.
3. No cervical spine fracture.
4. Moderate to severe degenerative disc disease in the lower
cervical spine. Severe bilateral facet arthropathy. Minimal 3 mm
retrolisthesis at C5-6 is likely degenerative in etiology. Otherwise
no cervical spine subluxation.

## 2023-02-09 ENCOUNTER — Inpatient Hospital Stay (HOSPITAL_COMMUNITY): Payer: Medicare Other

## 2023-02-09 ENCOUNTER — Emergency Department (HOSPITAL_COMMUNITY): Payer: Medicare Other

## 2023-02-09 ENCOUNTER — Encounter (HOSPITAL_COMMUNITY): Payer: Self-pay | Admitting: Radiology

## 2023-02-09 ENCOUNTER — Other Ambulatory Visit: Payer: Self-pay

## 2023-02-09 ENCOUNTER — Inpatient Hospital Stay (HOSPITAL_COMMUNITY)
Admission: EM | Admit: 2023-02-09 | Discharge: 2023-02-11 | DRG: 101 | Disposition: A | Discharge status: Skilled Nursing Facility | Payer: Medicare Other | Source: Skilled Nursing Facility | Attending: Family Medicine | Admitting: Family Medicine

## 2023-02-09 DIAGNOSIS — L4050 Arthropathic psoriasis, unspecified: Secondary | ICD-10-CM | POA: Diagnosis present

## 2023-02-09 DIAGNOSIS — I509 Heart failure, unspecified: Secondary | ICD-10-CM | POA: Diagnosis not present

## 2023-02-09 DIAGNOSIS — Z79899 Other long term (current) drug therapy: Secondary | ICD-10-CM

## 2023-02-09 DIAGNOSIS — R2990 Unspecified symptoms and signs involving the nervous system: Principal | ICD-10-CM

## 2023-02-09 DIAGNOSIS — D631 Anemia in chronic kidney disease: Secondary | ICD-10-CM | POA: Diagnosis present

## 2023-02-09 DIAGNOSIS — N390 Urinary tract infection, site not specified: Secondary | ICD-10-CM | POA: Diagnosis present

## 2023-02-09 DIAGNOSIS — Z7902 Long term (current) use of antithrombotics/antiplatelets: Secondary | ICD-10-CM

## 2023-02-09 DIAGNOSIS — Z66 Do not resuscitate: Secondary | ICD-10-CM | POA: Diagnosis present

## 2023-02-09 DIAGNOSIS — R471 Dysarthria and anarthria: Secondary | ICD-10-CM | POA: Diagnosis present

## 2023-02-09 DIAGNOSIS — R7989 Other specified abnormal findings of blood chemistry: Secondary | ICD-10-CM | POA: Diagnosis present

## 2023-02-09 DIAGNOSIS — N179 Acute kidney failure, unspecified: Secondary | ICD-10-CM | POA: Insufficient documentation

## 2023-02-09 DIAGNOSIS — I2510 Atherosclerotic heart disease of native coronary artery without angina pectoris: Secondary | ICD-10-CM | POA: Diagnosis present

## 2023-02-09 DIAGNOSIS — N1831 Chronic kidney disease, stage 3a: Secondary | ICD-10-CM | POA: Insufficient documentation

## 2023-02-09 DIAGNOSIS — R29810 Facial weakness: Secondary | ICD-10-CM | POA: Diagnosis present

## 2023-02-09 DIAGNOSIS — R569 Unspecified convulsions: Principal | ICD-10-CM | POA: Diagnosis present

## 2023-02-09 DIAGNOSIS — I5022 Chronic systolic (congestive) heart failure: Secondary | ICD-10-CM | POA: Diagnosis present

## 2023-02-09 DIAGNOSIS — E785 Hyperlipidemia, unspecified: Secondary | ICD-10-CM | POA: Diagnosis present

## 2023-02-09 DIAGNOSIS — Z880 Allergy status to penicillin: Secondary | ICD-10-CM | POA: Diagnosis not present

## 2023-02-09 DIAGNOSIS — I130 Hypertensive heart and chronic kidney disease with heart failure and stage 1 through stage 4 chronic kidney disease, or unspecified chronic kidney disease: Secondary | ICD-10-CM | POA: Diagnosis present

## 2023-02-09 DIAGNOSIS — E1122 Type 2 diabetes mellitus with diabetic chronic kidney disease: Secondary | ICD-10-CM | POA: Diagnosis present

## 2023-02-09 DIAGNOSIS — I252 Old myocardial infarction: Secondary | ICD-10-CM

## 2023-02-09 DIAGNOSIS — Z87440 Personal history of urinary (tract) infections: Secondary | ICD-10-CM

## 2023-02-09 DIAGNOSIS — M069 Rheumatoid arthritis, unspecified: Secondary | ICD-10-CM | POA: Diagnosis present

## 2023-02-09 DIAGNOSIS — Z794 Long term (current) use of insulin: Secondary | ICD-10-CM

## 2023-02-09 DIAGNOSIS — R414 Neurologic neglect syndrome: Secondary | ICD-10-CM | POA: Diagnosis present

## 2023-02-09 DIAGNOSIS — I69354 Hemiplegia and hemiparesis following cerebral infarction affecting left non-dominant side: Secondary | ICD-10-CM | POA: Diagnosis not present

## 2023-02-09 DIAGNOSIS — Z7982 Long term (current) use of aspirin: Secondary | ICD-10-CM

## 2023-02-09 DIAGNOSIS — G8194 Hemiplegia, unspecified affecting left nondominant side: Secondary | ICD-10-CM | POA: Diagnosis present

## 2023-02-09 DIAGNOSIS — Z888 Allergy status to other drugs, medicaments and biological substances status: Secondary | ICD-10-CM

## 2023-02-09 DIAGNOSIS — R4701 Aphasia: Secondary | ICD-10-CM | POA: Diagnosis present

## 2023-02-09 DIAGNOSIS — Z90710 Acquired absence of both cervix and uterus: Secondary | ICD-10-CM

## 2023-02-09 DIAGNOSIS — D693 Immune thrombocytopenic purpura: Secondary | ICD-10-CM | POA: Diagnosis present

## 2023-02-09 DIAGNOSIS — R531 Weakness: Secondary | ICD-10-CM

## 2023-02-09 DIAGNOSIS — Z9049 Acquired absence of other specified parts of digestive tract: Secondary | ICD-10-CM

## 2023-02-09 DIAGNOSIS — G8384 Todd's paralysis (postepileptic): Secondary | ICD-10-CM | POA: Diagnosis present

## 2023-02-09 LAB — COMPREHENSIVE METABOLIC PANEL
ALT: 19 U/L (ref 0–44)
AST: 21 U/L (ref 15–41)
Albumin: 3.8 g/dL (ref 3.5–5.0)
Alkaline Phosphatase: 70 U/L (ref 38–126)
Anion gap: 9 (ref 5–15)
BUN: 23 mg/dL (ref 8–23)
CO2: 21 mmol/L — ABNORMAL LOW (ref 22–32)
Calcium: 9.1 mg/dL (ref 8.9–10.3)
Chloride: 107 mmol/L (ref 98–111)
Creatinine, Ser: 1.45 mg/dL — ABNORMAL HIGH (ref 0.44–1.00)
GFR, Estimated: 38 mL/min — ABNORMAL LOW (ref >60)
Glucose, Bld: 188 mg/dL — ABNORMAL HIGH (ref 70–99)
Potassium: 4.5 mmol/L (ref 3.5–5.1)
Sodium: 137 mmol/L (ref 135–145)
Total Bilirubin: 0.7 mg/dL (ref <1.2)
Total Protein: 6.4 g/dL — ABNORMAL LOW (ref 6.5–8.1)

## 2023-02-09 LAB — DIFFERENTIAL
Abs Immature Granulocytes: 0.04 10*3/uL (ref 0.00–0.07)
Basophils Absolute: 0 10*3/uL (ref 0.0–0.1)
Basophils Relative: 1 %
Eosinophils Absolute: 0.4 10*3/uL (ref 0.0–0.5)
Eosinophils Relative: 5 %
Immature Granulocytes: 1 %
Lymphocytes Relative: 28 %
Lymphs Abs: 2.1 10*3/uL (ref 0.7–4.0)
Monocytes Absolute: 0.7 10*3/uL (ref 0.1–1.0)
Monocytes Relative: 9 %
Neutro Abs: 4.3 10*3/uL (ref 1.7–7.7)
Neutrophils Relative %: 56 %

## 2023-02-09 LAB — CBC
HCT: 35.8 % — ABNORMAL LOW (ref 36.0–46.0)
Hemoglobin: 12 g/dL (ref 12.0–15.0)
MCH: 30.8 pg (ref 26.0–34.0)
MCHC: 33.5 g/dL (ref 30.0–36.0)
MCV: 92 fL (ref 80.0–100.0)
Platelets: 127 10*3/uL — ABNORMAL LOW (ref 150–400)
RBC: 3.89 MIL/uL (ref 3.87–5.11)
RDW: 14 % (ref 11.5–15.5)
WBC: 7.6 10*3/uL (ref 4.0–10.5)
nRBC: 0 % (ref 0.0–0.2)

## 2023-02-09 LAB — CBG MONITORING, ED
Glucose-Capillary: 169 mg/dL — ABNORMAL HIGH (ref 70–99)
Glucose-Capillary: 204 mg/dL — ABNORMAL HIGH (ref 70–99)
Glucose-Capillary: 356 mg/dL — ABNORMAL HIGH (ref 70–99)

## 2023-02-09 LAB — LIPID PANEL
Cholesterol: 142 mg/dL (ref 0–200)
HDL: 36 mg/dL — ABNORMAL LOW (ref >40)
LDL Cholesterol: 79 mg/dL (ref 0–99)
Total CHOL/HDL Ratio: 3.9 {ratio}
Triglycerides: 135 mg/dL (ref <150)
VLDL: 27 mg/dL (ref 0–40)

## 2023-02-09 LAB — HEMOGLOBIN A1C
Hgb A1c MFr Bld: 8.2 % — ABNORMAL HIGH (ref 4.8–5.6)
Mean Plasma Glucose: 188.64 mg/dL

## 2023-02-09 LAB — ETHANOL: Alcohol, Ethyl (B): <10 mg/dL (ref <10)

## 2023-02-09 LAB — PROTIME-INR
INR: 1.1 (ref 0.8–1.2)
Prothrombin Time: 14.2 s (ref 11.4–15.2)

## 2023-02-09 LAB — APTT: aPTT: 28 s (ref 24–36)

## 2023-02-09 MED ORDER — LISINOPRIL 10 MG PO TABS
10.0000 mg | ORAL_TABLET | Freq: Every day | ORAL | Status: DC
  Administered 2023-02-10 – 2023-02-11 (×2): 10 mg via ORAL
  Filled 2023-02-09 (×2): qty 1

## 2023-02-09 MED ORDER — ASPIRIN 81 MG PO TBEC
81.0000 mg | DELAYED_RELEASE_TABLET | Freq: Every day | ORAL | Status: DC
Start: 2023-02-10 — End: 2023-02-11
  Administered 2023-02-10 – 2023-02-11 (×2): 81 mg via ORAL
  Filled 2023-02-09 (×2): qty 1

## 2023-02-09 MED ORDER — ATORVASTATIN CALCIUM 10 MG PO TABS
5.0000 mg | ORAL_TABLET | Freq: Every day | ORAL | Status: DC
  Administered 2023-02-09 – 2023-02-10 (×2): 5 mg via ORAL
  Filled 2023-02-09 (×2): qty 1

## 2023-02-09 MED ORDER — SERTRALINE HCL 50 MG PO TABS
75.0000 mg | ORAL_TABLET | Freq: Every day | ORAL | Status: DC
  Administered 2023-02-10 – 2023-02-11 (×2): 75 mg via ORAL
  Filled 2023-02-09 (×2): qty 2

## 2023-02-09 MED ORDER — SODIUM CHLORIDE 0.9% FLUSH: 3.0000 mL | Freq: Once | INTRAVENOUS | Status: DC

## 2023-02-09 MED ORDER — ENOXAPARIN SODIUM 40 MG/0.4ML IJ SOSY
40.0000 mg | PREFILLED_SYRINGE | INTRAMUSCULAR | Status: DC
  Administered 2023-02-09 – 2023-02-10 (×2): 40 mg via SUBCUTANEOUS
  Filled 2023-02-09 (×2): qty 0.4

## 2023-02-09 MED ORDER — LORAZEPAM 2 MG/ML IJ SOLN
1.0000 mg | Freq: Once | INTRAMUSCULAR | Status: AC | PRN
  Administered 2023-02-09: 1 mg via INTRAVENOUS
  Filled 2023-02-09: qty 1

## 2023-02-09 MED ORDER — IOHEXOL 350 MG/ML SOLN
100.0000 mL | Freq: Once | INTRAVENOUS | Status: AC | PRN
  Administered 2023-02-09: 100 mL via INTRAVENOUS

## 2023-02-09 MED ORDER — AMLODIPINE BESYLATE 5 MG PO TABS
10.0000 mg | ORAL_TABLET | Freq: Every day | ORAL | Status: DC
  Administered 2023-02-10 – 2023-02-11 (×2): 10 mg via ORAL
  Filled 2023-02-09 (×2): qty 2

## 2023-02-09 MED ORDER — INSULIN GLARGINE-YFGN 100 UNIT/ML ~~LOC~~ SOLN
10.0000 [IU] | Freq: Every day | SUBCUTANEOUS | Status: DC
Start: 2023-02-09 — End: 2023-02-10
  Administered 2023-02-09: 10 [IU] via SUBCUTANEOUS
  Filled 2023-02-09 (×2): qty 0.1

## 2023-02-09 MED ORDER — GABAPENTIN 100 MG PO CAPS
200.0000 mg | ORAL_CAPSULE | Freq: Two times a day (BID) | ORAL | Status: DC
  Administered 2023-02-09 – 2023-02-11 (×4): 200 mg via ORAL
  Filled 2023-02-09 (×4): qty 2

## 2023-02-09 MED ORDER — INSULIN ASPART 100 UNIT/ML IJ SOLN
0.0000 [IU] | Freq: Three times a day (TID) | INTRAMUSCULAR | Status: DC
  Administered 2023-02-09: 3 [IU] via SUBCUTANEOUS
  Administered 2023-02-10: 7 [IU] via SUBCUTANEOUS
  Administered 2023-02-10: 3 [IU] via SUBCUTANEOUS
  Administered 2023-02-10: 5 [IU] via SUBCUTANEOUS
  Administered 2023-02-11 (×2): 3 [IU] via SUBCUTANEOUS

## 2023-02-09 MED ORDER — HYDROXYCHLOROQUINE SULFATE 200 MG PO TABS
200.0000 mg | ORAL_TABLET | Freq: Every day | ORAL | Status: DC
Start: 2023-02-10 — End: 2023-02-11
  Administered 2023-02-10 – 2023-02-11 (×2): 200 mg via ORAL
  Filled 2023-02-09 (×3): qty 1

## 2023-02-09 MED ORDER — ACETAMINOPHEN 500 MG PO TABS
1000.0000 mg | ORAL_TABLET | Freq: Three times a day (TID) | ORAL | Status: DC | PRN
  Administered 2023-02-09 – 2023-02-11 (×2): 1000 mg via ORAL
  Filled 2023-02-09 (×2): qty 2

## 2023-02-09 MED ORDER — VALBENAZINE TOSYLATE 40 MG PO CAPS
40.0000 mg | ORAL_CAPSULE | Freq: Every day | ORAL | Status: DC
  Filled 2023-02-09 (×2): qty 1

## 2023-02-09 MED ORDER — POLYETHYLENE GLYCOL 3350 17 G PO PACK
17.0000 g | PACK | Freq: Every day | ORAL | Status: DC
  Administered 2023-02-10 – 2023-02-11 (×2): 17 g via ORAL
  Filled 2023-02-09 (×2): qty 1

## 2023-02-09 NOTE — Hospital Course (Signed)
Olivia Burnett is a 72 y.o. 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  L sided Hemiparesis  Patient arrived in the ED under code stroke. CT head and neck as well as CTA showed no acute findings, but did show numerous old infarcts. MRI supported this finding. EEG was negative for epileptiform waves or active seizures, and suggested global encephalopathy. EKG, ECHO and risk stratification labs were obtained, and were unremarkable. We also performed the work up for reversible causes of AMS which showed ***.  Elevated serum creatinine Present on admission labs. Patient was near baseline after 24 hours, UA w/reflex culture was ordered due to recent history of UTI and was ***.  Other chronic conditions were medically managed with home medications and formulary alternatives as necessary.  PCP Follow-up Recommendations:

## 2023-02-09 NOTE — Procedures (Signed)
Patient Name: Olivia Burnett  MRN: [id number]  Epilepsy Attending: Charlsie Quest  Referring Physician/Provider: Lynnae January, NP  Date: 02/09/2023 Duration: 26.50 mins  Patient history: 72yo F with left sided weakness getting eeg to evaluate for seizure  Level of alertness: Awake  AEDs during EEG study: None  Technical aspects: This EEG study was done with scalp electrodes positioned according to the 10-20 International system of electrode placement. Electrical activity was reviewed with band pass filter of 1-70Hz , sensitivity of 7 uV/mm, display speed of 26mm/sec with a 60Hz  notched filter applied as appropriate. EEG data were recorded continuously and digitally stored.  Video monitoring was available and reviewed as appropriate.  Description: The posterior dominant rhythm consists of 7.5 Hz activity of moderate voltage (25-35 uV) seen predominantly in posterior head regions, symmetric and reactive to eye opening and eye closing. EEG showed intermittent generalized 3 to 6 Hz theta-delta slowing. Hyperventilation and photic stimulation were not performed.     ABNORMALITY - Intermittent slow, generalized  IMPRESSION: This study is suggestive of mild diffuse encephalopathy. No seizures or epileptiform discharges were seen throughout the recording.  Paul Trettin Annabelle Harman

## 2023-02-09 NOTE — ED Notes (Signed)
This RN to set up dinner tray for pt

## 2023-02-09 NOTE — H&P (Addendum)
Hospital Admission History and Physical Service Pager: [phone]  Patient name: Olivia Burnett Medical record number: [id number] Date of Birth: 1950-05-11 Age: 72 y.o. Gender: female  Primary Care Provider: Pcp, No Consultants: Neurology Code Status: DNR  Preferred Emergency Contact:  Contact Information     Name Relation Home Work Mobile   Marceline Daughter   [phone]      Other Contacts   None on File      Chief Complaint: L sided weakness  Assessment and Plan: Zharick Galdo is a 72 y.o. female presenting with L sided weakness, suspected stroke . PMHx of prior CVA, diabetes, seizures. Differential for presentation of this includes seizure, TIA, stroke, mass effect, viral infection, Todd's Paralysis, hemiplegic migraine.  Given that this weakness is unilateral and presents a new symptom that was not present previously favor TIA or stroke over viral infection or mass effect.  No masses were seen on head imaging which also makes a mass less likely.  Seizure activity was not detected on EEG, and patient did not have classic symptoms such as clonic tonic movements, loss of bowel or bladder control or other seizure-like symptoms.  This would also make Todd's paralysis less likely.  Less convinced of viral infection as patient's CBC is negative for white count elevation and the patient has not had any recent sick symptoms.  Patient does not have known history of migraine and did not report any headaches leading up to her episode today. Assessment & Plan Acute left-sided weakness Undergoing workup for stroke vs seizure per neurology. MRI and EEG ordered prior to admission, will follow up as completed.  Patient passed bed side swallow and so can have diet. -admit to FMTS, Dr. Linwood Dibbles attending. -initial work up per neurology  -MRI  -EEG  -echo -vitals per floor -telemetry -PT/OT/SLP eval and treat -AM CBC, BMP, Mag -fall precautions -delirium precautions -A1c, lipid  panel Elevated serum creatinine Present on admission. Patient's baseline is around 1.2-1.3. Will trend overnight, could consider gentle fluids if remains NPO or creatinine increases.  Patient does endorse history of recurrent UTIs. -UA, reflex urine culture - AM BMP - Encourage p.o. fluids  Chronic and Stable Problems:  Diabetes-CBGs 3 times daily and nightly.  10 units Semglee, sensitive sliding scale insulin Seizures-continue home Lamictal and Ingrezza HTN-neurology does not want permissive hypertension, resume home medications HLD-continue home statin 5 mg  FEN/GI: Carb modified VTE Prophylaxis: Lovenox  Disposition: Progressive  History of Present Illness:  Olivia Burnett is a 72 y.o. female presenting with L sided weakness, suspected stroke. Last known normal was 0830 AM.  Reports Left sided weakness from her previous stroke, but it's worse today.  Per patient's daughter normally she does not have any focal deficits but more of a global weakness.  She does occasionally have trouble using her hands, but this is a change.  Her daughter also remarks that her facial droop is new, as are the speech changes.  At baseline she says her mother has some waxing and waning memory issues and will at times not remember who she is or who her grandson is.  Report's after 8-9 am, she started having problems feeding herself/using her hands. Also appreciates that her husband was trying to talk to her, but she couldn't understand what he was saying. Also appreciates that her neighbor at her facility thought she was having trouble speaking, and called for help.  Also appreciates pain with urination, and hx of chronic UTI's. Says she has a  UTI now and is on abx  In the ED, CODE STROKE was activated on arrival. CT head and CTA head and neck were obtained. CBC, CMP, CBG, coag studies and alcohol level were obtained and notable for Cr- 1.45, CO2- 21, Plt 127.  Stroke team involved for further  workup.  Review Of Systems: Per HPI with the following additions: Possible double vision/visual field changes.  Pertinent Past Medical History: Psoriatic Arthritis CKD HTN Diabetes Chronic ITP Recurrent UTI's Seizures CAD (NSTEMI 2019),  HLD Hx of CVA Chronic ITP, Seizures  Remainder reviewed in history tab.   Pertinent Past Surgical History: Cholecystectomy Appendectomy Ganglion Cyst Excision Breast Reduction Hysterectomy Abortion Tonsillectomy   Remainder reviewed in history tab.  Pertinent Social History: Tobacco use: No Alcohol use: No Other Substance use: No Lives with Husband  Pertinent Family History: CVA in both parents DM and HTN in both parents MI in father  Remainder reviewed in history tab.   Important Outpatient Medications: Lantus - 20 units (10 am/pm ?) Amlodipine 10  mg Liptor 10 Wellbutrin 150  Coreg +/- Plavix 75 Colace Cymbalta - no Nexium 2 Zetia Folvite Gabapentin Plaquenil Ingreza 40 Novolog Lamictal Lisinopril Metoprolol  Lyrica +/- Zoloft Trazodone Xanax   Remainder reviewed in medication history.   Objective: BP (!) 137/57   Pulse (!) 59   Temp 99.6 F (37.6 C) (Oral)   Resp 16   Wt 74.8 kg   SpO2 100%  Exam: General: Elderly appearing female no current distress Eyes: PERRLA, no injection or color change to the sclera. Cardiovascular: RRR, no M/R/G Respiratory: CTAB, no increased work of breathing Gastrointestinal: Flat, soft, nontender Neuro: Alert and oriented to person place time and situation.  Moderate facial droop on the left-hand side, diminished sensation to touch on the left side in the zygomatic and mandibular regions.  Difficulty with gaze tracking to the left but can do so with coaxing.  Reported double vision, diminished visual fields on the left.  Diminished strength in the left upper and lower extremities.  Drift with finger-to-nose testing on the left-hand side.  When asked to repeat "no if's  and's or buts", said "no if's and's or fingers". Psych: Appropriate affect somewhat tearful but consolable throughout exam.  Labs:  CBC BMET  Recent Labs  Lab 02/09/23 1314  WBC 7.6  HGB 12.0  HCT 35.8*  PLT 127*   Recent Labs  Lab 02/09/23 1314  NA 137  K 4.5  CL 107  CO2 21*  BUN 23  CREATININE 1.45*  GLUCOSE 188*  CALCIUM 9.1     EKG: Normal sinus rhythm, positive axis, normal R wave progression, no ST segment changes or elevations.   Imaging Studies Performed:  CT ANGIO HEAD NECK W WO CM W PERF (CODE STROKE) Result Date: 02/09/2023 CLINICAL DATA:  Neuro deficit, acute, stroke suspected. Left-sided weakness, aphasia and slurred speech. EXAM: CT ANGIOGRAPHY HEAD AND NECK CT PERFUSION BRAIN TECHNIQUE: Multidetector CT imaging of the head and neck was performed using the standard protocol during bolus administration of intravenous contrast. Multiplanar CT image reconstructions and MIPs were obtained to evaluate the vascular anatomy. Carotid stenosis measurements (when applicable) are obtained utilizing NASCET criteria, using the distal internal carotid diameter as the denominator. Multiphase CT imaging of the brain was performed following IV bolus contrast injection. Subsequent parametric perfusion maps were calculated using RAPID software. RADIATION DOSE REDUCTION: This exam was performed according to the departmental dose-optimization program which includes automated exposure control, adjustment of the mA and/or  kV according to patient size and/or use of iterative reconstruction technique. CONTRAST:  OMNIPAQUE IOHEXOL 350 MG/ML SOLN COMPARISON:  Head CT 02/09/2023.  CTA head/neck 03/15/2017. FINDINGS: CTA NECK FINDINGS Aortic arch: Three-vessel arch configuration. Arch vessel origins are patent. Right carotid system: No evidence of dissection, stenosis (50% or greater) or occlusion. Extensive calcified plaque along the right carotid bulb and proximal right cervical ICA. Left  carotid system: No evidence of dissection, stenosis (50% or greater) or occlusion. Moderate mixed plaque along the left carotid bulb and proximal left cervical ICA. Vertebral arteries: Severe stenosis of the non dominant right vertebral artery origin. Mixed plaque along the proximal left vertebral artery V1 segment without hemodynamically significant stenosis. Calcified plaque along the left V4 segment results in severe stenosis. The right vertebral artery functionally terminates in PICA with severe stenosis of the proximal right V4 segment. Skeleton: Mild cervical spondylosis without high-grade spinal canal stenosis. Other neck: Unremarkable. Upper chest: Unremarkable. Review of the MIP images confirms the above findings CTA HEAD FINDINGS Anterior circulation: Unchanged extensive atherosclerotic calcifications of the bilateral carotid siphons with at least moderate stenosis of the bilateral cavernous segments. Unchanged multifocal stenosis of the right MCA vessels, notable for mild proximal M1 stenosis, severe stenosis of the inferior M2 division and beaded appearance of more distal intracranial branches of the right MCA. Unchanged multifocal stenosis of the left MCA vessels, notable for severe stenosis of the proximal inferior M2 division and mid superior M2 division. Mild stenosis of the right ACA, A1 segment. Posterior circulation: Diminutive basilar artery. The SCAs, AICAs and PICAs are patent proximally. Unchanged multifocal stenosis of the left PCA, including severe stenosis at the left P1-P2 junction and severe stenosis of the proximal left P2 segment. Multifocal mild stenosis of the right PCA P2 and P3 segments. Asymmetric paucity of left PCA branches. Venous sinuses: As permitted by contrast timing, patent. Anatomic variants: None. Review of the MIP images confirms the above findings CT Brain Perfusion Findings: CBF (<30%) Volume: 0mL Perfusion (Tmax>6.0s) volume: 3mL Mismatch Volume: 3mL Ischemia  location:Left PCA territory. IMPRESSION: 1. No large vessel occlusion. 2. Unchanged extensive intracranial atherosclerotic disease, including severe stenosis of the bilateral MCAs and left PCA. 3. Unchanged severe stenosis of the non dominant right vertebral artery origin and proximal right V4 segment. 4. No hemodynamically significant stenosis of the cervical carotid arteries. 5. 3 mL of ischemia in the left PCA territory by automated post processing, corresponding to old infarct. No core infarct. Code stroke imaging results were communicated on 02/09/2023 at 1:37 pm to provider Dr. Pearlean Brownie via secure text paging. Electronically Signed   By: Orvan Falconer M.D.   On: 02/09/2023 13:55   CT HEAD CODE STROKE WO CONTRAST Result Date: 02/09/2023 CLINICAL DATA:  Code stroke. Neuro deficit, acute, stroke suspected. Left-sided weakness, aphasia and slurred speech. EXAM: CT HEAD WITHOUT CONTRAST TECHNIQUE: Contiguous axial images were obtained from the base of the skull through the vertex without intravenous contrast. RADIATION DOSE REDUCTION: This exam was performed according to the departmental dose-optimization program which includes automated exposure control, adjustment of the mA and/or kV according to patient size and/or use of iterative reconstruction technique. COMPARISON:  Head CT 07/26/2022. FINDINGS: Brain: No acute hemorrhage. Unchanged severe chronic small-vessel disease with numerous old perforator infarcts in the deep cerebral white matter, bilateral basal ganglia, bilateral thalami, right midbrain and right cerebellar hemisphere. Old cortical infarct in the medial aspect of the left occipital lobe. No new loss of cortical gray-white differentiation. No hydrocephalus  or extra-axial collection. No mass effect or midline shift. Vascular: No hyperdense vessel or unexpected calcification. Skull: No calvarial fracture or suspicious bone lesion. Skull base is unremarkable. Sinuses/Orbits: No acute finding.  Other: None. ASPECTS [facility] Stroke Program Early CT Score) - Ganglionic level infarction (caudate, lentiform nuclei, internal capsule, insula, M1-M3 cortex): 7 - Supraganglionic infarction (M4-M6 cortex): 3 Total score (0-10 with 10 being normal): 10 IMPRESSION: 1. No acute intracranial hemorrhage or evidence of acute large vessel territory infarct. ASPECT score is 10. 2. Unchanged severe chronic small-vessel disease with numerous old perforator infarcts as described above. Code stroke imaging results were communicated on 02/09/2023 at 1:37 pm to provider Dr. Pearlean Brownie via secure text paging. Electronically Signed   By: Orvan Falconer M.D.   On: 02/09/2023 13:37    Gerrit Heck, DO 02/09/2023, 2:17 PM PGY-1, [facility]  FPTS Intern pager: [phone], text pages welcome Secure chat group [facility] Teaching Service   Upper Level Addendum: I have seen and evaluated this patient along with Dr. Hyacinth Meeker and reviewed the above note, making necessary revisions as appropriate. I agree with the medical decision making and physical exam as noted above. Bess Kinds, MD PGY-3 [facility]

## 2023-02-09 NOTE — ED Notes (Signed)
MD at bedside. 

## 2023-02-09 NOTE — Code Documentation (Signed)
Stroke Response Nurse Documentation Code Documentation  Olivia Burnett is a 72 y.o. female arriving to [facility]  via [facility] on 02/09/2023 with past medical hx of stroke, seizures, DM, HTN. On clopidogrel 75 mg daily. Code stroke was activated by EMS.   Patient from nursing home where she was LKW at 0830 and now complaining of Left facial droop, left sided weakness, slurred speech, aphasia. Per EMS/Nursing Home staff, patient was fine when staff when to give her meds between 0800 and 0830. When the NP when in she noticed the patient was aphasic with slurred speech, left facial droop, and left sided weakness.   Stroke team at the bedside on patient arrival. Labs drawn and patient cleared for CT by Dr. Lockie Mola. Patient to CT with team. NIHSS 12, see documentation for details and code stroke times. Patient with disoriented, not following commands, right gaze preference , left facial droop, left arm weakness, left leg weakness, left decreased sensation, Expressive aphasia , and dysarthria  on exam. The following imaging was completed:  CT Head, CTA, and CTP. Patient is not a candidate for IV Thrombolytic due to outside of window for TNK. Patient is not not a candidate for IR due to no LVO noted on imaging.   Care Plan: VS/NIHSS q2hr x12hr, then q4hr; BP Goal <220/120.   Bedside handoff with ED RN Maralyn Sago.    Felecia Jan  Stroke Response RN

## 2023-02-09 NOTE — Assessment & Plan Note (Addendum)
Undergoing workup for stroke vs seizure per neurology. MRI and EEG ordered prior to admission, will follow up as completed.  Patient passed bed side swallow and so can have diet. -admit to FMTS, Dr. Linwood Dibbles attending. -initial work up per neurology  -MRI  -EEG  -echo -vitals per floor -telemetry -PT/OT/SLP eval and treat -AM CBC, BMP, Mag -fall precautions -delirium precautions -A1c, lipid panel

## 2023-02-09 NOTE — ED Provider Notes (Signed)
[place] EMERGENCY DEPARTMENT AT [facility] Provider Note   CSN: [id number] Arrival date & time: 02/09/23  1311  An emergency department physician performed an initial assessment on this suspected stroke patient at 1314.  History  Chief Complaint  Patient presents with   Code Stroke    Olivia Burnett is a 72 y.o. female.  Patient here with maybe last known normal 8:30 AM.  History of seizures.  History of stroke history of diabetes.  Supposedly left-sided weakness may code stroke in the field.  Some left-sided neglect.  Denies any other symptoms.  No recent illness.  She does not know she has had a seizure.  She denies any fever or chills.  Nothing makes it worse or better.  The history is provided by the patient and the EMS personnel.       Home Medications Prior to Admission medications   Medication Sig Start Date End Date Taking? Authorizing Provider  amLODipine (NORVASC) 10 MG tablet Take 10 mg by mouth daily. 02/01/23  Yes [provider]  atorvastatin (LIPITOR) 10 MG tablet Take 5 mg by mouth at bedtime. 02/01/23  Yes [provider]  gabapentin (NEURONTIN) 100 MG capsule Take by mouth. 02/01/23  Yes [provider]  INGREZZA 40 MG capsule Take 40 mg by mouth daily. 01/11/23  Yes [provider]  ALPRAZolam Prudy Feeler) 0.5 MG tablet Take 0.5 mg by mouth at bedtime as needed for anxiety.    [provider]  buPROPion (WELLBUTRIN SR) 150 MG 12 hr tablet Take 150 mg by mouth 2 (two) times daily.    [provider]  carvedilol (COREG) 12.5 MG tablet Take 12.5 mg by mouth 2 (two) times daily with a meal.    [provider]  clopidogrel (PLAVIX) 75 MG tablet Take 75 mg by mouth daily.    [provider]  docusate sodium (COLACE) 100 MG capsule Take 100 mg by mouth 2 (two) times daily.    [provider]  DULoxetine HCl (CYMBALTA PO) Take by mouth.    [provider]   esomeprazole (NEXIUM) 20 MG capsule Take 20 mg by mouth daily at 12 noon.    [provider]  ezetimibe (ZETIA) 10 MG tablet Take 10 mg by mouth daily.    [provider]  folic acid (FOLVITE) 1 MG tablet Take 1 mg by mouth daily.    [provider]  hydroxychloroquine (PLAQUENIL) 200 MG tablet Take 200 mg by mouth daily.    [provider]  insulin aspart (NOVOLOG) 100 UNIT/ML injection Inject into the skin 3 (three) times daily before meals.    [provider]  insulin glargine (LANTUS) 100 UNIT/ML injection Inject into the skin at bedtime.    [provider]  isosorbide mononitrate (IMDUR) 30 MG 24 hr tablet Take 30 mg by mouth daily.    [provider]  LamoTRIgine (LAMICTAL PO) Take by mouth.    [provider]  LINZESS 145 MCG CAPS capsule Take 145 mcg by mouth daily.    [provider]  lisinopril (PRINIVIL,ZESTRIL) 10 MG tablet Take 10 mg by mouth daily.    [provider]  Methotrexate, PF, 25 MG/0.5ML SOAJ Inject into the skin.    [provider]  metoprolol tartrate (LOPRESSOR) 25 MG tablet Take 25 mg by mouth daily.    [provider]  morphine (MSIR) 15 MG tablet Take 15 mg by mouth every 4 (four) hours as  needed for severe pain.    [provider]  NYSTATIN PO Take by mouth.    [provider]  polyethylene glycol (MIRALAX / GLYCOLAX) packet Take 17 g by mouth daily.    [provider]  pregabalin (LYRICA) 50 MG capsule Take 50 mg by mouth 3 (three) times daily.    [provider]  sertraline (ZOLOFT) 50 MG tablet Take 50 mg by mouth daily.    [provider]  traZODone (DESYREL) 100 MG tablet Take 100 mg by mouth at bedtime.    [provider]      Allergies    Infliximab, Meperidine, Mercury, Penicillins, Phenobarbital, Pregabalin, and Prochlorperazine    Review of Systems   Review of Systems  Physical  Exam Updated Vital Signs BP (!) 137/57   Pulse (!) 59   Temp 99.6 F (37.6 C) (Oral)   Resp 16   Wt 74.8 kg   SpO2 100%  Physical Exam Vitals and nursing note reviewed.  Constitutional:      General: She is not in acute distress.    Appearance: She is well-developed. She is not ill-appearing.  HENT:     Head: Normocephalic and atraumatic.     Nose: Nose normal.     Mouth/Throat:     Mouth: Mucous membranes are moist.  Eyes:     Extraocular Movements: Extraocular movements intact.     Conjunctiva/sclera: Conjunctivae normal.     Pupils: Pupils are equal, round, and reactive to light.  Cardiovascular:     Rate and Rhythm: Normal rate and regular rhythm.     Pulses: Normal pulses.     Heart sounds: Normal heart sounds. No murmur heard. Pulmonary:     Effort: Pulmonary effort is normal. No respiratory distress.     Breath sounds: Normal breath sounds.  Abdominal:     Palpations: Abdomen is soft.     Tenderness: There is no abdominal tenderness.  Musculoskeletal:        General: No swelling.     Cervical back: Normal range of motion and neck supple.  Skin:    General: Skin is warm and dry.     Capillary Refill: Capillary refill takes less than 2 seconds.  Neurological:     Mental Status: She is alert.     Motor: Weakness present.     Comments: Left-sided weakness compared to the right with left-sided drift and neglect, she does not seem to track very well with her eyes, right-sided strength appears to be okay sensation appears to be okay, speech pleased to be intact  Psychiatric:        Mood and Affect: Mood normal.     ED Results / Procedures / Treatments   Labs (all labs ordered are listed, but only abnormal results are displayed) Labs Reviewed  CBC - Abnormal; Notable for the following components:      Result Value   HCT 35.8 (*)    Platelets 127 (*)    All other components within normal limits  COMPREHENSIVE METABOLIC PANEL - Abnormal; Notable for the following  components:   CO2 21 (*)    Glucose, Bld 188 (*)    Creatinine, Ser 1.45 (*)    Total Protein 6.4 (*)    GFR, Estimated 38 (*)    All other components within normal limits  CBG MONITORING, ED - Abnormal; Notable for the following components:   Glucose-Capillary 169 (*)    All other components within normal limits  PROTIME-INR  APTT  DIFFERENTIAL  ETHANOL  I-STAT CHEM 8, ED    EKG EKG Interpretation Date/Time:  Tuesday February 09 2023 13:43:15 EST Ventricular Rate:  61 PR Interval:  57 QRS Duration:  96 QT Interval:  433 QTC Calculation: 437 R Axis:   74  Text Interpretation: Sinus rhythm Short PR interval Borderline low voltage, extremity leads Confirmed by Virgina Norfolk [phone]) on 02/09/2023 1:53:11 PM  Radiology CT ANGIO HEAD NECK W WO CM W PERF (CODE STROKE) Result Date: 02/09/2023 CLINICAL DATA:  Neuro deficit, acute, stroke suspected. Left-sided weakness, aphasia and slurred speech. EXAM: CT ANGIOGRAPHY HEAD AND NECK CT PERFUSION BRAIN TECHNIQUE: Multidetector CT imaging of the head and neck was performed using the standard protocol during bolus administration of intravenous contrast. Multiplanar CT image reconstructions and MIPs were obtained to evaluate the vascular anatomy. Carotid stenosis measurements (when applicable) are obtained utilizing NASCET criteria, using the distal internal carotid diameter as the denominator. Multiphase CT imaging of the brain was performed following IV bolus contrast injection. Subsequent parametric perfusion maps were calculated using RAPID software. RADIATION DOSE REDUCTION: This exam was performed according to the departmental dose-optimization program which includes automated exposure control, adjustment of the mA and/or kV according to patient size and/or use of iterative reconstruction technique. CONTRAST:  OMNIPAQUE IOHEXOL 350 MG/ML SOLN COMPARISON:  Head CT 02/09/2023.  CTA head/neck 03/15/2017. FINDINGS: CTA NECK FINDINGS Aortic  arch: Three-vessel arch configuration. Arch vessel origins are patent. Right carotid system: No evidence of dissection, stenosis (50% or greater) or occlusion. Extensive calcified plaque along the right carotid bulb and proximal right cervical ICA. Left carotid system: No evidence of dissection, stenosis (50% or greater) or occlusion. Moderate mixed plaque along the left carotid bulb and proximal left cervical ICA. Vertebral arteries: Severe stenosis of the non dominant right vertebral artery origin. Mixed plaque along the proximal left vertebral artery V1 segment without hemodynamically significant stenosis. Calcified plaque along the left V4 segment results in severe stenosis. The right vertebral artery functionally terminates in PICA with severe stenosis of the proximal right V4 segment. Skeleton: Mild cervical spondylosis without high-grade spinal canal stenosis. Other neck: Unremarkable. Upper chest: Unremarkable. Review of the MIP images confirms the above findings CTA HEAD FINDINGS Anterior circulation: Unchanged extensive atherosclerotic calcifications of the bilateral carotid siphons with at least moderate stenosis of the bilateral cavernous segments. Unchanged multifocal stenosis of the right MCA vessels, notable for mild proximal M1 stenosis, severe stenosis of the inferior M2 division and beaded appearance of more distal intracranial branches of the right MCA. Unchanged multifocal stenosis of the left MCA vessels, notable for severe stenosis of the proximal inferior M2 division and mid superior M2 division. Mild stenosis of the right ACA, A1 segment. Posterior circulation: Diminutive basilar artery. The SCAs, AICAs and PICAs are patent proximally. Unchanged multifocal stenosis of the left PCA, including severe stenosis at the left P1-P2 junction and severe stenosis of the proximal left P2 segment. Multifocal mild stenosis of the right PCA P2 and P3 segments. Asymmetric paucity of left PCA branches.  Venous sinuses: As permitted by contrast timing, patent. Anatomic variants: None. Review of the MIP images confirms the above findings CT Brain Perfusion Findings: CBF (<30%) Volume: 0mL Perfusion (Tmax>6.0s) volume: 3mL Mismatch Volume: 3mL Ischemia location:Left PCA territory. IMPRESSION: 1. No large vessel occlusion. 2. Unchanged extensive intracranial atherosclerotic disease, including severe stenosis of the bilateral MCAs and left PCA. 3. Unchanged severe stenosis of the non dominant right vertebral artery origin and proximal right V4  segment. 4. No hemodynamically significant stenosis of the cervical carotid arteries. 5. 3 mL of ischemia in the left PCA territory by automated post processing, corresponding to old infarct. No core infarct. Code stroke imaging results were communicated on 02/09/2023 at 1:37 pm to provider Dr. Pearlean Brownie via secure text paging. Electronically Signed   By: Orvan Falconer M.D.   On: 02/09/2023 13:55   CT HEAD CODE STROKE WO CONTRAST Result Date: 02/09/2023 CLINICAL DATA:  Code stroke. Neuro deficit, acute, stroke suspected. Left-sided weakness, aphasia and slurred speech. EXAM: CT HEAD WITHOUT CONTRAST TECHNIQUE: Contiguous axial images were obtained from the base of the skull through the vertex without intravenous contrast. RADIATION DOSE REDUCTION: This exam was performed according to the departmental dose-optimization program which includes automated exposure control, adjustment of the mA and/or kV according to patient size and/or use of iterative reconstruction technique. COMPARISON:  Head CT 07/26/2022. FINDINGS: Brain: No acute hemorrhage. Unchanged severe chronic small-vessel disease with numerous old perforator infarcts in the deep cerebral white matter, bilateral basal ganglia, bilateral thalami, right midbrain and right cerebellar hemisphere. Old cortical infarct in the medial aspect of the left occipital lobe. No new loss of cortical gray-white differentiation. No  hydrocephalus or extra-axial collection. No mass effect or midline shift. Vascular: No hyperdense vessel or unexpected calcification. Skull: No calvarial fracture or suspicious bone lesion. Skull base is unremarkable. Sinuses/Orbits: No acute finding. Other: None. ASPECTS [facility] Stroke Program Early CT Score) - Ganglionic level infarction (caudate, lentiform nuclei, internal capsule, insula, M1-M3 cortex): 7 - Supraganglionic infarction (M4-M6 cortex): 3 Total score (0-10 with 10 being normal): 10 IMPRESSION: 1. No acute intracranial hemorrhage or evidence of acute large vessel territory infarct. ASPECT score is 10. 2. Unchanged severe chronic small-vessel disease with numerous old perforator infarcts as described above. Code stroke imaging results were communicated on 02/09/2023 at 1:37 pm to provider Dr. Pearlean Brownie via secure text paging. Electronically Signed   By: Orvan Falconer M.D.   On: 02/09/2023 13:37    Procedures Procedures    Medications Ordered in ED Medications  sodium chloride flush (NS) 0.9 % injection 3 mL (3 mLs Intravenous Not Given 02/09/23 1347)  LORazepam (ATIVAN) injection 1 mg (has no administration in time range)  iohexol (OMNIPAQUE) 350 MG/ML injection 100 mL (100 mLs Intravenous Contrast Given 02/09/23 1335)    ED Course/ Medical Decision Making/ A&P                                 Medical Decision Making Amount and/or Complexity of Data Reviewed Labs: ordered. Radiology: ordered.  Risk Decision regarding hospitalization.   Sujey Saxer is here as a code stroke.  Left-sided weakness that started at some point this morning.  Last known normal could be around 830 but cannot verify.  She is not can to be a TNK candidate.  No LVO was found on CTA CT and perfusion study with Dr. Pearlean Brownie and neurology team who was at the bedside upon patient arrival.  She does have a history of seizures.  History of hypertension and diabetes and stroke.  Differential diagnosis could be  stroke versus Todd's paralysis versus seizure but she is awake and alert and do not think she is having any active seizure activity now.  She is not really tracking with her eyes very well.  It is hard to get a good eye exam on her.  But she does appear to be weak  in the left side with left-sided drift.  She is getting an EEG and MRI and basic labs and be admitted to medicine for further stroke workup/neurologic workup.  She has no chest pain or shortness of breath or any other complaints.  EKG shows sinus rhythm.  No ischemic changes.  Lab work for my review and interpretation is unremarkable.  CT scans with no acute stroke findings per radiology report at this time.  This chart was dictated using voice recognition software.  Despite best efforts to proofread,  errors can occur which can change the documentation meaning.         Final Clinical Impression(s) / ED Diagnoses Final diagnoses:  Stroke-like episode    Rx / DC Orders ED Discharge Orders     None         Virgina Norfolk, DO 02/09/23 1438

## 2023-02-09 NOTE — ED Notes (Signed)
Patient transported to MRI 

## 2023-02-09 NOTE — Consult Note (Signed)
NEUROLOGY CONSULT NOTE   Date of service: February 09, 2023 Patient Name: Olivia Burnett MRN:  [id number] DOB:  Aug 26, 1950 Chief Complaint: "CODE STROKE" Requesting Provider: Virgina Norfolk, DO  History of Present Illness  Olivia Burnett is a 72 y.o. female with a PMH significant for Stroke, Seizures, DM, HTN who was BIB EMS due to left-sided weakness, left facial droop, aphasia and dysarthria. On exam, patient is alert, confused, left facial droop, right gaze preference but does cross midline, left arm and leg weakness with drift, decreased sensation to left face. She follows intermittent simple commands. She is unable to complete FNF/HKS but no overt ataxia seen. NIH: 12. History as per patient's daughter she has memory loss and gait and balance difficulty at baseline and lives in a nursing home and requires assistance even to go to the bathroom.  She has history of multiple prior strokes. LKW: 8-8:30 this am per caretaker but probably the night before Modified rankin score: 4-Needs assistance to walk and tend to bodily needs IV Thrombolysis: No, outside of window Mechanical thrombectomy : No as no LVO  NIHSS components Score: Comment  1a Level of Conscious 0[x]  1[]  2[]  3[]      1b LOC Questions 0[]  1[]  2[x]       1c LOC Commands 0[]  1[x]  2[]       2 Best Gaze 0[]  1[x]  2[]       3 Visual 0[]  1[]  2[]  3[]      4 Facial Palsy 0[]  1[x]  2[]  3[]      5a Motor Arm - left 0[]  1[]  2[x]  3[]  4[]  UN[]    5b Motor Arm - Right 0[x]  1[]  2[]  3[]  4[]  UN[]    6a Motor Leg - Left 0[]  1[]  2[x]  3[]  4[]  UN[]    6b Motor Leg - Right 0[x]  1[]  2[]  3[]  4[]  UN[]    7 Limb Ataxia 0[]  1[]  2[]  3[]  UN[]     8 Sensory 0[]  1[x]  2[]  UN[]      9 Best Language 0[]  1[x]  2[]  3[]      10 Dysarthria 0[]  1[x]  2[]  UN[]      11 Extinct. and Inattention 0[x]  1[]  2[]       TOTAL:   12      ROS   Unable to ascertain due to AMS  Past History   Past Medical History:  Diagnosis Date   Diabetes mellitus without complication (HCC)     Ganglion cyst    Hypertension    Seizures (HCC)    Stroke [facility])     Past Surgical History:  Procedure Laterality Date   ABDOMINAL HYSTERECTOMY     APPENDECTOMY     TONSILLECTOMY      Family History: No family history on file.  Social History  reports that she has never smoked. She does not have any smokeless tobacco history on file. She reports that she does not drink alcohol and does not use drugs.  Allergies  Allergen Reactions   Mercury     hives   Penicillins     rash    Medications   Current Facility-Administered Medications:    sodium chloride flush (NS) 0.9 % injection 3 mL, 3 mL, Intravenous, Once, [place], Adam, DO  Current Outpatient Medications:    ALPRAZolam (XANAX) 0.5 MG tablet, Take 0.5 mg by mouth at bedtime as needed for anxiety., Disp: , Rfl:    buPROPion (WELLBUTRIN SR) 150 MG 12 hr tablet, Take 150 mg by mouth 2 (two) times daily., Disp: , Rfl:    carvedilol (COREG) 12.5 MG tablet, Take 12.5 mg  by mouth 2 (two) times daily with a meal., Disp: , Rfl:    clopidogrel (PLAVIX) 75 MG tablet, Take 75 mg by mouth daily., Disp: , Rfl:    docusate sodium (COLACE) 100 MG capsule, Take 100 mg by mouth 2 (two) times daily., Disp: , Rfl:    DULoxetine HCl (CYMBALTA PO), Take by mouth., Disp: , Rfl:    esomeprazole (NEXIUM) 20 MG capsule, Take 20 mg by mouth daily at 12 noon., Disp: , Rfl:    ezetimibe (ZETIA) 10 MG tablet, Take 10 mg by mouth daily., Disp: , Rfl:    folic acid (FOLVITE) 1 MG tablet, Take 1 mg by mouth daily., Disp: , Rfl:    insulin aspart (NOVOLOG) 100 UNIT/ML injection, Inject into the skin 3 (three) times daily before meals., Disp: , Rfl:    insulin glargine (LANTUS) 100 UNIT/ML injection, Inject into the skin at bedtime., Disp: , Rfl:    LamoTRIgine (LAMICTAL PO), Take by mouth., Disp: , Rfl:    lisinopril (PRINIVIL,ZESTRIL) 10 MG tablet, Take 10 mg by mouth daily., Disp: , Rfl:    Methotrexate, PF, 25 MG/0.5ML SOAJ, Inject into the skin.,  Disp: , Rfl:    morphine (MSIR) 15 MG tablet, Take 15 mg by mouth every 4 (four) hours as needed for severe pain., Disp: , Rfl:    NYSTATIN PO, Take by mouth., Disp: , Rfl:    polyethylene glycol (MIRALAX / GLYCOLAX) packet, Take 17 g by mouth daily., Disp: , Rfl:    pregabalin (LYRICA) 50 MG capsule, Take 50 mg by mouth 3 (three) times daily., Disp: , Rfl:    traZODone (DESYREL) 100 MG tablet, Take 100 mg by mouth at bedtime., Disp: , Rfl:   Vitals   Vitals:   02/09/23 1300  Weight: 74.8 kg    There is no height or weight on file to calculate BMI.  Physical Exam   Constitutional: Appears well-developed and well-nourished.  HHENT: [place]/AT Cardiovascular: Normal rate and regular rhythm.  Respiratory: Effort normal, non-labored breathing.  GI: Soft.  No distension. There is no tenderness.  Skin: WDI.   Neurologic Examination   Neuro: Mental Status: Patient is awake, alert. Disoriented to age, month, place.  Patient is unable to give a clear and coherent history. Dysarthria and mild receptive aphasia present.  Cranial Nerves: II: Does not blink to threat on left. Pupils are equal, round, and reactive to light.   III,IV, VI: EOMI without ptosis or diploplia.  V: Facial sensation is decreased on left VII: Facial movement is symmetric.  VIII: hearing is intact to voice X: Uvula elevates symmetrically XI: Shoulder shrug is symmetric. XII: tongue is midline without atrophy or fasciculations.  Motor: Tone is normal. Bulk is normal.  LUE: 4-/5 proximal and distal, 4/5 grip, with drift LLE: 4/5, drift.  Normal strength on right Sensory: Decreased sensation to left face. Cerebellar: UTA. No overt ataxia seen.    Labs/Imaging/Neurodiagnostic studies   CBC: No results for input(s): "WBC", "NEUTROABS", "HGB", "HCT", "MCV", "PLT" in the last 168 hours. Basic Metabolic Panel: No results found for: "NA", "K", "CO2", "GLUCOSE", "BUN", "CREATININE", "CALCIUM", "GFRNONAA",  "GFRAA" Lipid Panel: No results found for: "LDLCALC" HgbA1c: No results found for: "HGBA1C" Urine Drug Screen: No results found for: "LABOPIA", "COCAINSCRNUR", "LABBENZ", "AMPHETMU", "THCU", "LABBARB"  Alcohol Level No results found for: "ETH" INR No results found for: "INR" APTT No results found for: "APTT" AED levels: No results found for: "PHENYTOIN", "ZONISAMIDE", "LAMOTRIGINE", "LEVETIRACETA"  CT Head without contrast(Personally  reviewed): No acute intracranial abnormality ASPECTS 10  CT angio Head and Neck with contrast(Personally reviewed): No LVO Severe chronic small-vessel disease with numerous old perforator infarcts  MRI Brain(Personally reviewed): PENDING  Neurodiagnostics rEEG:  PENDING  ASSESSMENT  Olivia Burnett is a 72 y.o. female with a PMH significant for Stroke, Seizures, DM, HTN who was BIB EMS as a code stroke.  On exam, patient is alert, confused, left facial droop, right gaze preference but does cross midline, left arm and leg weakness with drift, decreased sensation to left face. She follows intermittent simple commands. She is unable to complete FNF/HKS but no overt ataxia seen. NIH: 12.CT and CTA negative. Patient has history of seizures.  Differentials include small right hemispheric acute infarct, Seizure, Toxic-metabolic encephalopathy Patient is not a candidate for thrombolysis due to unclear last seen normal  . Patient is not a candidate for mechanical thrombectomy due to no LVO noted on CT angio RECOMMENDATIONS  - Q2H neuro checks - STAT CT with any acute neuro change - MRI Brain  - EEG - Toxic-Metabolic workup per primary -Admit to medical hospitalist team and stroke team will follow on consult _______________________________________________________________.  Pt seen by Neuro NP/APP and later by MD. Note/plan to be edited by MD as needed.    Lynnae January, DNP, AGACNP-BC [facility] Please use AMION for contact information &  EPIC for messaging.  STROKE MD NOTE :  I have personally obtained history,examined this patient, reviewed notes, independently viewed imaging studies, participated in medical decision making and plan of care.ROS completed by me personally and pertinent positives fully documented  I have made any additions or clarifications directly to the above note. Agree with note above.  Patient presented with unclear last seen normal with altered mental status and left hemiparesis.  She has history of seizures but there was no definite witnessed seizure activity this time.  CT head is unremarkable and CT angio shows diffuse multi vessel intracranial stenosis but no clear LVO.  CT perfusion shows only small defect in the left PCA and area of old infarct.  Patient likely had a seizure with postictal confusion versus small right hemispheric infarct.  Recommend admission to the medical hospitalist team for further evaluation and check MRI scan and EEG.  For reversible medical lab abnormalities.  Long discussion over the phone with the patient's daughter Amy about her presentation, evaluation and treatment plan and answered questions.  Discussed with Dr. Shella Spearing ER MD and answered questions Greater than 50% time during this 55-minute ER consultation visit was spent on counseling and coordination of care about patient presentation and discussion about differential diagnosis and evaluation and treatment plan discussion with care team and family answering questions. Delia Heady, MD Medical Director [facility] Pager: [phone] 02/09/2023 4:51 PM

## 2023-02-09 NOTE — Assessment & Plan Note (Addendum)
Present on admission. Patient's baseline is around 1.2-1.3. Will trend overnight, could consider gentle fluids if remains NPO or creatinine increases.  Patient does endorse history of recurrent UTIs. -UA, reflex urine culture - AM BMP - Encourage p.o. fluids

## 2023-02-09 NOTE — Plan of Care (Signed)
FMTS Brief Progress Note  S: Sandara Neukam is a 72 y.o. female with a history of CVA, diabetes, seizures presenting with L sided weakness, admitted for stroke workup.  Saw patient at bedside with Dr. Barb Merino. Patient is oriented to self and situation, but not place (states church) or month (states June). Clarifies history that she only noticed L-sided weakness this AM when she was unable to bring the fork up to her mouth while eating.  States she previously did not have the same deficits. Denies diplopia.  States she is not in any pain.  O: BP (!) 146/60   Pulse 88   Temp (!) 97.2 F (36.2 C) (Oral)   Resp 16   Wt 74.8 kg   SpO2 99%   General: Elderly female, resting comfortably in bed, NAD, alert. HEENT: PERRLA, normocephalic, atraumatic. MMM. Cardiovascular: Regular rate and rhythm. Normal S1/S2. No murmurs, rubs, or gallops appreciated. 2+ radial pulses. Pulmonary: Clear bilaterally to ascultation. No increased WOB, no accessory muscle usage on room air. No wheezes, crackles, or rhonchi. Skin: Warm and dry. Extremities: No peripheral edema bilaterally. Capillary refill <2 seconds.  Neurological Examination: MS:  Awake, alert, interactive. Normal eye contact, mild dysarthria, normal comprehension with slowed responses.  Attention and concentration were normal.  Alert to self and situation, but not place or month. CRANIAL NERVES:    CN 2:  PERRLA. CNs 3, 4, 6:  Full extraocular eye movement without nystagmus.  No ptosis or diplopia. CN 5:  Facial sensation L<R, no weakness of masticatory muscles.  CN 7:  No facial weakness or asymmetry.  CN 8:  Auditory acuity grossly normal. CNs 9/10:  Palate elevation symmetric. CN 11:  Sternocleidomastoid strength R>L. CN 12:  Tongue is midline without atrophy or fasciculations. MOTOR:  Strength 5/5 RUE, 3/5 LUE, 5/5 RLE, 3/5 LLE. COORDINATION:  Intact finger-to-nose, though some trembling. SENSATION:  Intact to light touch all four  extremities.   A/P:  Left hemiparesis Unclear etiology, though stroke team was favoring seizure.  No evidence of acute CVA on CT head, CTA, or MRI brain, only old infarct.  EEG with encephalopathy, but no seizures or epileptiform changes.  No evidence of uremia, EtOH intoxication, and anion gap normal.  Will CTM. -Continuing neuro checks Q2h -Neurology evaluated and signed off, stroke team to follow -f/u TTE -Delirium precautions -PT/OT/SLP to eval and treat tomorrow, will assist with d/c planning -Continuing home valbenazine 40 mg daily  Hypertension No permissive HTN period per Neuro. -Restart home amlodipine 10 mg and lisinopril 10 mg  -Orders reviewed. Labs for AM ordered, which were adjusted as needed -If condition changes, plan includes STAT Neuro consult or reimaging  Sharion Dove, Jeraldine Primeau, MD 02/09/2023, 9:08 PM PGY-1, [facility] Night Resident  Please page [phone] with questions.

## 2023-02-09 NOTE — Progress Notes (Signed)
STAT EEG complete - results pending. ? ?

## 2023-02-10 ENCOUNTER — Inpatient Hospital Stay (HOSPITAL_COMMUNITY): Payer: Medicare Other

## 2023-02-10 ENCOUNTER — Encounter (HOSPITAL_COMMUNITY): Payer: Self-pay | Admitting: Family Medicine

## 2023-02-10 DIAGNOSIS — R531 Weakness: Secondary | ICD-10-CM

## 2023-02-10 DIAGNOSIS — I509 Heart failure, unspecified: Secondary | ICD-10-CM

## 2023-02-10 DIAGNOSIS — R29810 Facial weakness: Secondary | ICD-10-CM

## 2023-02-10 DIAGNOSIS — R471 Dysarthria and anarthria: Secondary | ICD-10-CM

## 2023-02-10 DIAGNOSIS — R4701 Aphasia: Secondary | ICD-10-CM

## 2023-02-10 LAB — BASIC METABOLIC PANEL
Anion gap: 10 (ref 5–15)
BUN: 29 mg/dL — ABNORMAL HIGH (ref 8–23)
CO2: 21 mmol/L — ABNORMAL LOW (ref 22–32)
Calcium: 8.7 mg/dL — ABNORMAL LOW (ref 8.9–10.3)
Chloride: 103 mmol/L (ref 98–111)
Creatinine, Ser: 1.38 mg/dL — ABNORMAL HIGH (ref 0.44–1.00)
GFR, Estimated: 40 mL/min — ABNORMAL LOW (ref >60)
Glucose, Bld: 257 mg/dL — ABNORMAL HIGH (ref 70–99)
Potassium: 4.6 mmol/L (ref 3.5–5.1)
Sodium: 134 mmol/L — ABNORMAL LOW (ref 135–145)

## 2023-02-10 LAB — ECHOCARDIOGRAM COMPLETE
AR max vel: 2.18 cm2
AV Area VTI: 2.18 cm2
AV Area mean vel: 2.16 cm2
AV Mean grad: 5 mm[Hg]
AV Peak grad: 9.3 mm[Hg]
Ao pk vel: 1.53 m/s
Area-P 1/2: 2.73 cm2
S' Lateral: 3.2 cm
Weight: 2638.47 [oz_av]

## 2023-02-10 LAB — SALICYLATE LEVEL: Salicylate Lvl: <7 mg/dL — ABNORMAL LOW (ref 7.0–30.0)

## 2023-02-10 LAB — GLUCOSE, CAPILLARY
Glucose-Capillary: 289 mg/dL — ABNORMAL HIGH (ref 70–99)
Glucose-Capillary: 377 mg/dL — ABNORMAL HIGH (ref 70–99)

## 2023-02-10 LAB — CBC
HCT: 33 % — ABNORMAL LOW (ref 36.0–46.0)
HCT: 32.3 % — ABNORMAL LOW (ref 36.0–46.0)
Hemoglobin: 11.3 g/dL — ABNORMAL LOW (ref 12.0–15.0)
Hemoglobin: 11.2 g/dL — ABNORMAL LOW (ref 12.0–15.0)
MCH: 30.8 pg (ref 26.0–34.0)
MCH: 31 pg (ref 26.0–34.0)
MCHC: 34.2 g/dL (ref 30.0–36.0)
MCHC: 34.7 g/dL (ref 30.0–36.0)
MCV: 89.9 fL (ref 80.0–100.0)
MCV: 89.5 fL (ref 80.0–100.0)
Platelets: 116 10*3/uL — ABNORMAL LOW (ref 150–400)
Platelets: 130 10*3/uL — ABNORMAL LOW (ref 150–400)
RBC: 3.67 MIL/uL — ABNORMAL LOW (ref 3.87–5.11)
RBC: 3.61 MIL/uL — ABNORMAL LOW (ref 3.87–5.11)
RDW: 13.7 % (ref 11.5–15.5)
RDW: 13.6 % (ref 11.5–15.5)
WBC: 6.6 10*3/uL (ref 4.0–10.5)
WBC: 7.5 10*3/uL (ref 4.0–10.5)
nRBC: 0 % (ref 0.0–0.2)
nRBC: 0 % (ref 0.0–0.2)

## 2023-02-10 LAB — FOLATE: Folate: 16.6 ng/mL (ref >5.9)

## 2023-02-10 LAB — CBG MONITORING, ED
Glucose-Capillary: 307 mg/dL — ABNORMAL HIGH (ref 70–99)
Glucose-Capillary: 216 mg/dL — ABNORMAL HIGH (ref 70–99)

## 2023-02-10 LAB — VITAMIN B12: Vitamin B-12: 318 pg/mL (ref 180–914)

## 2023-02-10 LAB — HIV ANTIBODY (ROUTINE TESTING W REFLEX): HIV Screen 4th Generation wRfx: NONREACTIVE

## 2023-02-10 MED ORDER — ISOSORBIDE MONONITRATE ER 30 MG PO TB24
30.0000 mg | ORAL_TABLET | Freq: Every day | ORAL | Status: DC
  Administered 2023-02-10 – 2023-02-11 (×2): 30 mg via ORAL
  Filled 2023-02-10 (×2): qty 1

## 2023-02-10 MED ORDER — METOPROLOL TARTRATE 12.5 MG HALF TABLET
12.5000 mg | ORAL_TABLET | Freq: Two times a day (BID) | ORAL | Status: DC
  Administered 2023-02-10 (×2): 12.5 mg via ORAL
  Filled 2023-02-10 (×3): qty 1

## 2023-02-10 MED ORDER — PERFLUTREN LIPID MICROSPHERE
1.0000 mL | INTRAVENOUS | Status: AC | PRN
Start: 2023-02-10 — End: 2023-02-10
  Administered 2023-02-10: 3 mL via INTRAVENOUS

## 2023-02-10 MED ORDER — INSULIN GLARGINE-YFGN 100 UNIT/ML ~~LOC~~ SOLN
20.0000 [IU] | Freq: Every day | SUBCUTANEOUS | Status: DC
  Administered 2023-02-10: 20 [IU] via SUBCUTANEOUS
  Filled 2023-02-10 (×2): qty 0.2

## 2023-02-10 MED ORDER — VALBENAZINE TOSYLATE 40 MG PO CAPS
40.0000 mg | ORAL_CAPSULE | Freq: Every day | ORAL | Status: DC
  Administered 2023-02-10 – 2023-02-11 (×2): 40 mg via ORAL
  Filled 2023-02-10 (×2): qty 1

## 2023-02-10 MED ORDER — MELATONIN 5 MG PO TABS
5.0000 mg | ORAL_TABLET | Freq: Every day | ORAL | Status: DC
Start: 2023-02-11 — End: 2023-02-11

## 2023-02-10 NOTE — Evaluation (Signed)
Physical Therapy Evaluation Patient Details Name: Olivia Burnett MRN: [id number] DOB: 1950-05-01 Today's Date: 02/10/2023  History of Present Illness  Patient is a 72 year old female female presenting with left side weakness, admitted for stroke work up. History of CVA, diabetes, seizures. MRI of brain reported no acute abnormality.  Clinical Impression  Patient is agreeable to PT evaluation. She is cooperative throughout session. She initially was not oriented to place, but is able to recall why she is here. She is a poor historian. She reports she lives in a ALF, but unsure of which one. At baseline she reports being in a wheelchair for the majority of the time but can walk short distances with the rolling walker with and without assistance.  Today, the patient has mild left leg weakness with exam. She required minimal assistance with bed mobility and transfers. She feels generalized weakness with standing and declined walking away from the bed. She was able to take one step forward, backward, and to the right with no knee buckling using rolling walker. Recommend PT follow up while in the hospital to maximize independence and facilitate return to prior level of function. Patient would like to return to her facility if possible if they can provide the assistance needed. Otherwise, patient would need rehabilitation < 3 hours/day.       If plan is discharge home, recommend the following: A little help with walking and/or transfers;A little help with bathing/dressing/bathroom;Assist for transportation;Help with stairs or ramp for entrance;Supervision due to cognitive status;Assistance with cooking/housework;Direct supervision/assist for medications management;Direct supervision/assist for financial management   Can travel by private vehicle        Equipment Recommendations None recommended by PT  Recommendations for Other Services       Functional Status Assessment Patient has had a recent  decline in their functional status and demonstrates the ability to make significant improvements in function in a reasonable and predictable amount of time.     Precautions / Restrictions Precautions Precautions: Fall Restrictions Weight Bearing Restrictions Per Provider Order: No      Mobility  Bed Mobility Overal bed mobility: Needs Assistance Bed Mobility: Supine to Sit, Sit to Supine     Supine to sit: Min assist Sit to supine: Min assist   General bed mobility comments: assistance for trunk support to sit upright. assistance for LE support to return to bed    Transfers Overall transfer level: Needs assistance Equipment used: Rolling walker (2 wheels) Transfers: Sit to/from Stand Sit to Stand: Min assist           General transfer comment: steadying assistance provided    Ambulation/Gait Ambulation/Gait assistance: Min assist, Contact guard assist   Assistive device: Rolling walker (2 wheels)         General Gait Details: patient is able to take a step forward, backward, and to the right with Min A- CGA for safety. patient reports feeling too weak to attempt walking away from the bed.  Stairs            Wheelchair Mobility     Tilt Bed    Modified Rankin (Stroke Patients Only)       Balance Overall balance assessment: Needs assistance Sitting-balance support: Feet supported Sitting balance-Leahy Scale: Fair     Standing balance support: Bilateral upper extremity supported Standing balance-Leahy Scale: Poor Standing balance comment: external support required  Pertinent Vitals/Pain Pain Assessment Pain Assessment: No/denies pain    Home Living Family/patient expects to be discharged to:: Assisted living                   Additional Comments: patient is a poor historian and unable to state the name of where she lives, but she does report it is ALF and she  has lived there ~ 2 weeks     Prior Function Prior Level of Function : Needs assist;Patient poor historian/Family not available             Mobility Comments: patient reports she is in a wheelchair most of the time but can perform short distance ambulation with the rolling walker with or without assistance ADLs Comments: PRN assist required     Extremity/Trunk Assessment   Upper Extremity Assessment Upper Extremity Assessment: Defer to OT evaluation    Lower Extremity Assessment Lower Extremity Assessment: LLE deficits/detail (RLE WNL) LLE Deficits / Details: knee extension 4/5, dorsiflexion 5/5, plantarflexion 5/5 LLE Sensation: WNL       Communication   Communication Communication: Difficulty communicating thoughts/reduced clarity of speech Cueing Techniques: Verbal cues  Cognition Arousal: Alert Behavior During Therapy: WFL for tasks assessed/performed Overall Cognitive Status: No family/caregiver present to determine baseline cognitive functioning                                 General Comments: Patient is oriented to situation, person, month, and place when given choices. she is able to follow single step commands consistently and cooperative throughout session        General Comments      Exercises     Assessment/Plan    PT Assessment Patient needs continued PT services  PT Problem List Decreased strength;Decreased activity tolerance;Decreased balance;Decreased mobility;Decreased safety awareness;Decreased cognition       PT Treatment Interventions DME instruction;Gait training;Functional mobility training;Stair training;Therapeutic activities;Therapeutic exercise;Balance training;Neuromuscular re-education;Cognitive remediation;Patient/family education;Wheelchair mobility training    PT Goals (Current goals can be found in the Care Plan section)  Acute Rehab PT Goals Patient Stated Goal: to return to her ALF PT Goal Formulation: With patient Time For Goal  Achievement: 02/24/23 Potential to Achieve Goals: Good Additional Goals Additional Goal #1: patient will propel wheelchair 217ft with supervision in preparation for mobility at facility    Frequency Min 1X/week     Co-evaluation PT/OT/SLP Co-Evaluation/Treatment: Yes Reason for Co-Treatment: Complexity of the patient's impairments (multi-system involvement)           AM-PAC PT "6 Clicks" Mobility  Outcome Measure Help needed turning from your back to your side while in a flat bed without using bedrails?: A Little Help needed moving from lying on your back to sitting on the side of a flat bed without using bedrails?: A Little Help needed moving to and from a bed to a chair (including a wheelchair)?: A Little Help needed standing up from a chair using your arms (e.g., wheelchair or bedside chair)?: A Little Help needed to walk in hospital room?: A Little Help needed climbing 3-5 steps with a railing? : Total 6 Click Score: 16    End of Session Equipment Utilized During Treatment: Gait belt Activity Tolerance: Patient tolerated treatment well Patient left: in bed;with call bell/phone within reach (on stretcher) Nurse Communication: Mobility status PT Visit Diagnosis: Muscle weakness (generalized) (M62.81);Unsteadiness on feet (R26.81)    Time: 0102-7253 PT Time Calculation (min) (ACUTE ONLY):  27 min   Charges:   PT Evaluation $PT Eval Low Complexity: 1 Low   PT General Charges $$ ACUTE PT VISIT: 1 Visit         Donna Bernard, PT, MPT   Ina Homes 02/10/2023, 1:42 PM

## 2023-02-10 NOTE — Assessment & Plan Note (Signed)
Creatinine is at high baseline, UA has not yet been collected. -UA, reflex urine culture - AM BMP - Encourage p.o. fluids

## 2023-02-10 NOTE — Progress Notes (Signed)
     Daily Progress Note Intern Pager: [phone]  Patient name: Olivia Burnett Medical record number: [id number] Date of birth: 07-05-1950 Age: 72 y.o. Gender: female  Primary Care Provider: Pcp, No Consultants: Neurology Code Status: DNR  Pt Overview and Major Events to Date:  12/17: Admitted to FMTS  Assessment and Plan:  Olivia Burnett is a 72 year old lady with a past medical history of hypertension, diabetes, HLD and prior CVAs presenting with acute left hemiparesis.  Stroke team is following but at this time does not feel she has evidence of new acute infarct.  They favor seizure with postictal hemiparesis.  We will complete the workup as detailed below, appreciate their recommendations. Assessment & Plan Acute left-sided weakness MRI yesterday showed no acute infarcts, EEG showed encephalopathy wave pattern but no evidence of seizure or epileptiform waves.  Will follow-up labs ordered on admission, and follow neurology recommendations for further testing. -Neurology is following, appreciate their recommendations -Echo pending, follow-up -vitals per floor -telemetry -PT/OT/SLP eval and treat -AM CBC, BMP, Mag -fall precautions -delirium precautions -A1c, lipid panel-pending -Reversible causes workup:  B12, folate, UDS, salicylate, RPR Elevated serum creatinine Creatinine is at high baseline, UA has not yet been collected. -UA, reflex urine culture - AM BMP - Encourage p.o. fluids  Chronic and Stable Problems:  Diabetes-increased to 20 units Semglee, sensitive sliding scale insulin, consider increasing to 15 units daily Seizures-continue home Lamictal and Ingrezza  FEN/GI: Carb modified PPx: lovenox Dispo:Pending PT recommendations  pending clinical improvement .  Subjective:  Patient sleeping on initial encounter this morning.  She awoke easily to voice and said she was doing well and she had no complaints.  Objective: Temp:  [97.2 F (36.2 C)-99.6 F (37.6  C)] 98.6 F (37 C) (12/18 0430) Pulse Rate:  [59-96] 64 (12/18 0615) Resp:  [11-19] 13 (12/18 0615) BP: (130-161)/(56-81) 145/60 (12/18 0600) SpO2:  [97 %-100 %] 99 % (12/18 0615) Weight:  [74.8 kg] 74.8 kg (12/17 1300) Physical Exam: General: Elderly appearing female no distress Cardiovascular: RRR, no M/R/G Respiratory: CTAB, no increased work of breathing Abdomen: Flat, soft, nontender Neuro: Sensation in the left side of the face has returned today and feels equal to that on the right.  Equal strength on the left when compared to the right in the upper and lower extremities.  Some residual drift noted.  Patient still has significant facial droop and difficulty with speech mostly slowing and slurring of words.  Laboratory: Most recent CBC Lab Results  Component Value Date   WBC 6.6 02/10/2023   HGB 11.3 (L) 02/10/2023   HCT 33.0 (L) 02/10/2023   MCV 89.9 02/10/2023   PLT 116 (L) 02/10/2023   Most recent BMP    Latest Ref Rng & Units 02/10/2023    4:46 AM  BMP  Glucose 70 - 99 mg/dL 147   BUN 8 - 23 mg/dL 29   Creatinine 8.29 - 1.00 mg/dL 5.62   Sodium 130 - 865 mmol/L 134   Potassium 3.5 - 5.1 mmol/L 4.6   Chloride 98 - 111 mmol/L 103   CO2 22 - 32 mmol/L 21   Calcium 8.9 - 10.3 mg/dL 8.7     Olivia Heck, DO 02/10/2023, 7:25 AM  PGY-1, [facility] FPTS Intern pager: [phone], text pages welcome Secure chat group [facility] Teaching Service

## 2023-02-10 NOTE — Progress Notes (Signed)
NEUROLOGY CONSULT FOLLOW UP NOTE   Date of service: February 10, 2023 Patient Name: Olivia Burnett MRN:  [id number] DOB:  02-12-1951  Brief HPI  Olivia Burnett is a 72 y.o. female  has a past medical history of Diabetes mellitus without complication (HCC), Ganglion cyst, Hypertension, Seizures (HCC), and Stroke (HCC). who presented with left-sided weakness, left facial droop, aphasia and dysarthria.  Patient lives in a facility and requires assistance with ADLs at baseline.  She states that she has left-sided weakness at baseline due to previous strokes.  On admission, she was noted to have left facial droop and left-sided weakness as well as confusion.  MRI revealed no acute infarct, and EEG revealed no seizures or epileptiform discharges.   Interval Hx/subjective   Patient has been hemodynamically stable and afebrile overnight.  She is more alert and oriented and able to speak clearly and consistently follows simple commands today. Vitals   Vitals:   02/10/23 0201 02/10/23 0430 02/10/23 0600 02/10/23 0615  BP:  (!) 150/63 (!) 145/60   Pulse: 72 65 64 64  Resp: 17 14 15 13   Temp:  98.6 F (37 C)    TempSrc:  Oral    SpO2: 100% 100% 97% 99%  Weight:         There is no height or weight on file to calculate BMI.  Physical Exam   Constitutional: Appears well-developed and well-nourished.  Psych: Affect appropriate to situation.  Eyes: No scleral injection.  HENT: No OP obstrucion.  Head: Normocephalic.  Cardiovascular: Normal rate and regular rhythm.  Respiratory: Effort normal, non-labored breathing.  Skin: WDI.   Neurologic Examination    NEURO:  Mental Status: Alert, able to state name but disoriented to place time and situation, consistently able to follow simple commands Speech/Language: speech is clear and fluent  Cranial Nerves:  II: PERRL.  III, IV, VI: EOMI. Eyelids elevate symmetrically.  V: Sensation is intact to light touch and symmetrical to face.  VII:  Left facial droop VIII: hearing intact to voice. IX, X: Palate elevates symmetrically. Phonation is normal.  WU:[id number] shrug 5/5. XII: tongue is midline without fasciculations. Motor: 5 out of 5 strength to bilateral upper extremities, proximal and distal with slightly weak weakness of the left hand grip.  4+ out of 5 strength to right lower extremity, proximal and distal, 4 out of 5 strength to left lower extremity hip flexion with some drift, 4+ out of 5 strength to left lower extremity knee flexion and knee extension, plantarflexion and dorsiflexion Tone: is normal and bulk is normal Sensation- Intact to light touch bilaterally.  DTRs: 2+ to right patellar, left bicep and bilateral brachioradialis, unable to elicit others Gait- deferred   Medications  Current Facility-Administered Medications:    acetaminophen (TYLENOL) tablet 1,000 mg, 1,000 mg, Oral, Q8H PRN, Gerrit Heck, DO, 1,000 mg at 02/09/23 1803   amLODipine (NORVASC) tablet 10 mg, 10 mg, Oral, Daily, Shitarev, Dimitry, MD   aspirin EC tablet 81 mg, 81 mg, Oral, Daily, Hyacinth Meeker, Samantha, DO   atorvastatin (LIPITOR) tablet 5 mg, 5 mg, Oral, QHS, Miller, Samantha, DO, 5 mg at 02/09/23 2144   enoxaparin (LOVENOX) injection 40 mg, 40 mg, Subcutaneous, Q24H, Miller, Samantha, DO, 40 mg at 02/09/23 2147   gabapentin (NEURONTIN) capsule 200 mg, 200 mg, Oral, BID, Gerrit Heck, DO, 200 mg at 02/09/23 2144   hydroxychloroquine (PLAQUENIL) tablet 200 mg, 200 mg, Oral, Daily, Miller, Samantha, DO   insulin aspart (novoLOG) injection 0-9 Units, 0-9 Units,  Subcutaneous, TID WC, Gerrit Heck, DO, 3 Units at 02/09/23 1803   insulin glargine-yfgn (SEMGLEE) injection 10 Units, 10 Units, Subcutaneous, QHS, Gerrit Heck, DO, 10 Units at 02/09/23 2249   lisinopril (ZESTRIL) tablet 10 mg, 10 mg, Oral, Daily, Shitarev, Dimitry, MD   polyethylene glycol (MIRALAX / GLYCOLAX) packet 17 g, 17 g, Oral, Daily, Hyacinth Meeker, Samantha, DO    sertraline (ZOLOFT) tablet 75 mg, 75 mg, Oral, Daily, Miller, Samantha, DO   sodium chloride flush (NS) 0.9 % injection 3 mL, 3 mL, Intravenous, Once, Curatolo, Adam, DO   valbenazine [facility]) capsule 40 mg, 40 mg, Oral, Daily, Gerrit Heck, DO  Current Outpatient Medications:    acetaminophen (TYLENOL) 500 MG tablet, Take 1,000 mg by mouth in the morning, at noon, and at bedtime., Disp: , Rfl:    amLODipine (NORVASC) 10 MG tablet, Take 10 mg by mouth daily., Disp: , Rfl:    aspirin EC 81 MG tablet, Take 81 mg by mouth daily. Swallow whole., Disp: , Rfl:    atorvastatin (LIPITOR) 10 MG tablet, Take 5 mg by mouth at bedtime., Disp: , Rfl:    Carboxymethylcellulose Sodium (ARTIFICIAL TEARS OP), Place 1 drop into both eyes 4 (four) times daily., Disp: , Rfl:    Cranberry 450 MG TABS, Take 450 mg by mouth daily., Disp: , Rfl:    gabapentin (NEURONTIN) 100 MG capsule, Take 200 mg by mouth 2 (two) times daily., Disp: , Rfl:    Glucagon 1 MG/0.2ML SOLN, Inject 1 mg into the skin as needed (low blood sugar)., Disp: , Rfl:    hydroxychloroquine (PLAQUENIL) 200 MG tablet, Take 200 mg by mouth daily., Disp: , Rfl:    INGREZZA 40 MG capsule, Take 40 mg by mouth daily., Disp: , Rfl:    insulin glargine (LANTUS) 100 UNIT/ML injection, Inject 13 Units into the skin daily., Disp: , Rfl:    isosorbide mononitrate (IMDUR) 30 MG 24 hr tablet, Take 30 mg by mouth daily., Disp: , Rfl:    LACTOBACILLUS PO, Take 1 capsule by mouth daily., Disp: , Rfl:    LINZESS 145 MCG CAPS capsule, Take 145 mcg by mouth daily., Disp: , Rfl:    lisinopril (PRINIVIL,ZESTRIL) 10 MG tablet, Take 10 mg by mouth daily., Disp: , Rfl:    melatonin 3 MG TABS tablet, Take 9 mg by mouth at bedtime., Disp: , Rfl:    Menthol-Zinc Oxide (CALMOSEPTINE) 0.44-20.6 % OINT, Apply 1 Application topically in the morning and at bedtime., Disp: , Rfl:    metoprolol tartrate (LOPRESSOR) 25 MG tablet, Take 12.5 mg by mouth 2 (two) times daily., Disp:  , Rfl:    morphine (KADIAN) 10 MG 24 hr capsule, Take 10 mg by mouth daily., Disp: , Rfl:    nystatin cream (MYCOSTATIN), Apply 1 Application topically 2 (two) times daily as needed (rash)., Disp: , Rfl:    polyethylene glycol (MIRALAX / GLYCOLAX) packet, Take 17 g by mouth daily., Disp: , Rfl:    selenium sulfide (SELSUN) 1 % LOTN, Apply 1 Application topically daily. On Wednesday and Friday, Disp: , Rfl:    sennosides-docusate sodium (SENOKOT-S) 8.6-50 MG tablet, Take 2 tablets by mouth in the morning and at bedtime., Disp: , Rfl:    sertraline (ZOLOFT) 50 MG tablet, Take 75 mg by mouth daily., Disp: , Rfl:    simethicone (MYLICON) 125 MG chewable tablet, Chew 125 mg by mouth at bedtime as needed for flatulence., Disp: , Rfl:    triamcinolone (KENALOG) 0.1 % paste,  Use as directed 1 Application in the mouth or throat at bedtime as needed (cold sores)., Disp: , Rfl:    vancomycin (VANCOCIN) 125 MG capsule, Take 125 mg by mouth 4 (four) times daily. Start 01/15/23 Stop 01/25/23 (Patient not taking: Reported on 02/09/2023), Disp: , Rfl:  Labs and Diagnostic Imaging   CBC:  Recent Labs  Lab 02/09/23 1314 02/10/23 0446  WBC 7.6 6.6  NEUTROABS 4.3  --   HGB 12.0 11.3*  HCT 35.8* 33.0*  MCV 92.0 89.9  PLT 127* 116*    Basic Metabolic Panel:  Lab Results  Component Value Date   NA 134 (L) 02/10/2023   K 4.6 02/10/2023   CO2 21 (L) 02/10/2023   GLUCOSE 257 (H) 02/10/2023   BUN 29 (H) 02/10/2023   CREATININE 1.38 (H) 02/10/2023   CALCIUM 8.7 (L) 02/10/2023   GFRNONAA 40 (L) 02/10/2023   Lipid Panel:  Lab Results  Component Value Date   LDLCALC 79 02/09/2023   HgbA1c:  Lab Results  Component Value Date   HGBA1C 8.2 (H) 02/09/2023   Urine Drug Screen: No results found for: "LABOPIA", "COCAINSCRNUR", "LABBENZ", "AMPHETMU", "THCU", "LABBARB"  Alcohol Level     Component Value Date/Time   ETH <10 02/09/2023 1314   INR  Lab Results  Component Value Date   INR 1.1 02/09/2023    APTT  Lab Results  Component Value Date   APTT 28 02/09/2023   AED levels: No results found for: "PHENYTOIN", "ZONISAMIDE", "LAMOTRIGINE", "LEVETIRACETA"  CT Head without contrast(Personally reviewed): No acute abnormality, chronic small vessel disease  CT angio Head and Neck with contrast(Personally reviewed): Emergent LVO and unchanged 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 female with history of multiple strokes, seizures, diabetes and hypertension who presented with left-sided weakness, left facial droop, aphasia and dysarthria.  Patient is disoriented to place time and situation but able to state that she has baseline left-sided weakness due to previous strokes.  Her exam appears to have improved from yesterday, but some left-sided weakness remains as well as left-sided facial droop.  MRI was negative for acute infarct.  It is quite possible that patient had an unobserved seizure with some Todd's paralysis which has now resolved.  She does have a remote history of seizure and was previously taking keppra and tolerated it well although family eventually decided she was taking too many medications and self-discontinued it. Reco  Recommendations  -Continue workup of toxic metabolic encephalopathy per primary team - Resume keppra 500mg  bid --Neurology to sign off, but please re-engage if additional neurologic concerns arise. ______________________________________________________________________   Signed, Marjorie Smolder, NP [facility]    Attending Neurohospitalist Addendum Patient seen and examined with APP/Resident. Agree with the  history and physical as documented above. Agree with the plan as documented, which I helped formulate. I have edited the note above to reflect my full findings and recommendations. I have independently reviewed the chart, obtained history, review of systems and examined the patient.I have personally reviewed pertinent head/neck/spine imaging (CT/MRI). Please feel free to call with any questions.  -- Bing Neighbors, MD [facility] [phone]  If 7pm- 7am, please page neurology on call as listed in AMION.

## 2023-02-10 NOTE — Inpatient Diabetes Management (Signed)
Inpatient Diabetes Program Recommendations  AACE/ADA: New Consensus Statement on Inpatient Glycemic Control (2015)  Target Ranges:  Prepandial:   less than 140 mg/dL      Peak postprandial:   less than 180 mg/dL (1-2 hours)      Critically ill patients:  140 - 180 mg/dL   Lab Results  Component Value Date   GLUCAP 307 (H) 02/10/2023   HGBA1C 8.2 (H) 02/09/2023    Review of Glycemic Control  Latest Reference Range & Units 02/09/23 22:44 02/10/23 08:30  Glucose-Capillary 70 - 99 mg/dL 782 (H) 956 (H)  (H): Data is abnormally high Diabetes history: Type 2 DM Outpatient Diabetes medications: Lantus 13 units QHS Current orders for Inpatient glycemic control: Novolog 0-9 units TID, Semglee 10 units QHS  Inpatient Diabetes Program Recommendations:    Consider increasing Lantus to 15 units at bedtime  Thanks, Lujean Rave, MSN, RNC-OB Diabetes Coordinator [phone] (8a-5p)

## 2023-02-10 NOTE — Evaluation (Signed)
Occupational Therapy Evaluation Patient Details Name: Olivia Burnett MRN: [id number] DOB: 1950-05-30 Today's Date: 02/10/2023   History of Present Illness Patient is a 72 year old female female presenting with left side weakness, admitted for stroke work up. History of CVA, diabetes, seizures. MRI of brain reported no acute abnormality.   Clinical Impression   Patient presenting with confusion, slurred speech, and need for increased assist to complete ADL management. Patient unaware where she lives, and unaware of how much assist she needs. Patient with noted eye fatigue and increased effort with visual assessment. Patient mod A for ADL managment, and min A for bed mobility and transfers, though ambulation not assessed. OT deferring to case management for placement as patient is unsure if she lives in assisted living or in a skilled nursing facility. Patient would benefit from OT services at discharge; OT will continue to follow acutely.      If plan is discharge home, recommend the following: A lot of help with walking and/or transfers;A lot of help with bathing/dressing/bathroom;Assistance with cooking/housework;Direct supervision/assist for medications management;Direct supervision/assist for financial management;Assist for transportation;Help with stairs or ramp for entrance;Supervision due to cognitive status    Functional Status Assessment  Patient has had a recent decline in their functional status and demonstrates the ability to make significant improvements in function in a reasonable and predictable amount of time.  Equipment Recommendations  Other (comment) (defer to next venue)    Recommendations for Other Services       Precautions / Restrictions Precautions Precautions: Fall Restrictions Weight Bearing Restrictions Per Provider Order: No      Mobility Bed Mobility Overal bed mobility: Needs Assistance Bed Mobility: Supine to Sit, Sit to Supine     Supine to sit: Min  assist Sit to supine: Min assist   General bed mobility comments: assistance for trunk support to sit upright. assistance for LE support to return to bed    Transfers Overall transfer level: Needs assistance Equipment used: Rolling walker (2 wheels) Transfers: Sit to/from Stand Sit to Stand: Min assist           General transfer comment: steadying assistance provided      Balance Overall balance assessment: Needs assistance Sitting-balance support: Feet supported Sitting balance-Leahy Scale: Fair     Standing balance support: Bilateral upper extremity supported Standing balance-Leahy Scale: Poor Standing balance comment: external support required                           ADL either performed or assessed with clinical judgement   ADL Overall ADL's : Needs assistance/impaired Eating/Feeding: Set up;Sitting   Grooming: Set up;Sitting   Upper Body Bathing: Minimal assistance;Sitting   Lower Body Bathing: Maximal assistance;Sitting/lateral leans;Sit to/from stand   Upper Body Dressing : Minimal assistance;Sitting   Lower Body Dressing: Maximal assistance;Sit to/from stand;Sitting/lateral leans   Toilet Transfer: Minimal assistance;Stand-pivot;Ambulation;BSC/3in1;Rolling walker (2 wheels) Toilet Transfer Details (indicate cue type and reason): simulated with transfer Toileting- Clothing Manipulation and Hygiene: Maximal assistance;Sitting/lateral lean;Sit to/from stand       Functional mobility during ADLs: Moderate assistance;Cueing for safety;Cueing for sequencing;Rolling walker (2 wheels) General ADL Comments: Patient presenting with confusion, slurred speech, and need for increased assist to complete ADL management. Patient unaware where she lives, and unaware of how much assist she needs. Patient with noted eye fatigue and increased effort with visual assessment. Patient mod A for ADL managment, and min A for bed mobility and transfers, though ambulation  not assessed. OT deferring to case management for placement as patient is unsure if she lives in assisted living or in a skilled nursing facility. Patient would benefit from OT services at discharge; OT will continue to follow acutely.     Vision Baseline Vision/History: 0 No visual deficits Ability to See in Adequate Light: 0 Adequate Patient Visual Report: No change from baseline Vision Assessment?: Yes Eye Alignment: Within Functional Limits Ocular Range of Motion: Within Functional Limits Alignment/Gaze Preference: Within Defined Limits Tracking/Visual Pursuits: Decreased smoothness of horizontal tracking;Decreased smoothness of vertical tracking Saccades: Decreased speed of saccadic movement Convergence: Within functional limits Visual Fields: No apparent deficits Additional Comments: increased time for visual assessment, patient reporting eye fatigue and increased effort     Perception Perception: Not tested       Praxis Praxis: Not tested       Pertinent Vitals/Pain Pain Assessment Pain Assessment: No/denies pain     Extremity/Trunk Assessment Upper Extremity Assessment Upper Extremity Assessment: Right hand dominant;LUE deficits/detail LUE Deficits / Details: decreased grip strength in LUE, appropriate FMC, stating differences in sensation, then stating there werent any LUE Sensation: decreased light touch LUE Coordination: decreased gross motor   Lower Extremity Assessment Lower Extremity Assessment: Defer to PT evaluation   Cervical / Trunk Assessment Cervical / Trunk Assessment: Kyphotic (minimally)   Communication Communication Communication: Difficulty communicating thoughts/reduced clarity of speech   Cognition Arousal: Alert Behavior During Therapy: WFL for tasks assessed/performed Overall Cognitive Status: No family/caregiver present to determine baseline cognitive functioning                                 General Comments: Patient is  oriented to situation, person, month, and place when given choices. she is able to follow single step commands consistently and cooperative throughout session     General Comments  VSS on RA    Exercises     Shoulder Instructions      Home Living Family/patient expects to be discharged to:: Assisted living                                 Additional Comments: patient is a poor historian and unable to state the name of where she lives, but she does report it is ALF and she  has lived there ~ 2 weeks      Prior Functioning/Environment Prior Level of Function : Needs assist;Patient poor historian/Family not available             Mobility Comments: patient reports she is in a wheelchair most of the time but can perform short distance ambulation with the rolling walker with or without assistance ADLs Comments: PRN assist required        OT Problem List: Decreased strength;Decreased range of motion;Decreased activity tolerance;Impaired balance (sitting and/or standing);Impaired vision/perception;Decreased coordination;Decreased cognition;Decreased safety awareness;Decreased knowledge of use of DME or AE;Decreased knowledge of precautions;Impaired sensation      OT Treatment/Interventions: Self-care/ADL training;Neuromuscular education;Therapeutic exercise;Energy conservation;DME and/or AE instruction;Therapeutic activities;Cognitive remediation/compensation;Patient/family education;Balance training;Manual therapy    OT Goals(Current goals can be found in the care plan section) Acute Rehab OT Goals Patient Stated Goal: unable to state OT Goal Formulation: Patient unable to participate in goal setting Time For Goal Achievement: 02/24/23 Potential to Achieve Goals: Fair ADL Goals Pt Will Perform Lower Body Bathing: with min assist;sitting/lateral leans;sit to/from stand Pt Will  Perform Lower Body Dressing: with min assist;sit to/from stand;sitting/lateral leans Pt Will  Transfer to Toilet: with min assist;stand pivot transfer;bedside commode Pt Will Perform Toileting - Clothing Manipulation and hygiene: with min assist;sitting/lateral leans;sit to/from stand Additional ADL Goal #1: Patient will be able to follow 2 step commands without need for cues to recall in order to promote progress towards higher level cognitive capabilities.  OT Frequency: Min 1X/week    Co-evaluation   Reason for Co-Treatment: Complexity of the patient's impairments (multi-system involvement)   OT goals addressed during session: ADL's and self-care      AM-PAC OT "6 Clicks" Daily Activity     Outcome Measure Help from another person eating meals?: A Little Help from another person taking care of personal grooming?: A Little Help from another person toileting, which includes using toliet, bedpan, or urinal?: A Lot Help from another person bathing (including washing, rinsing, drying)?: A Lot Help from another person to put on and taking off regular upper body clothing?: A Little Help from another person to put on and taking off regular lower body clothing?: A Lot 6 Click Score: 15   End of Session Equipment Utilized During Treatment: Gait belt;Rolling walker (2 wheels) Nurse Communication: Mobility status  Activity Tolerance: Patient limited by fatigue Patient left: in bed;with call bell/phone within reach  OT Visit Diagnosis: Unsteadiness on feet (R26.81);Other abnormalities of gait and mobility (R26.89);Muscle weakness (generalized) (M62.81);Other symptoms and signs involving cognitive function                Time: 1011-1031 OT Time Calculation (min): 20 min Charges:  OT General Charges $OT Visit: 1 Visit OT Evaluation $OT Eval Moderate Complexity: 1 Mod  Pollyann Glen E. Kameo Bains, OTR/L Acute Rehabilitation Services [phone]   [name] 02/10/2023, 4:01 PM

## 2023-02-10 NOTE — Plan of Care (Signed)
Pleasantly confused.  Watching TV.  No complaints.  Follows commands  Problem: Education: Goal: Ability to describe self-care measures that may prevent or decrease complications (Diabetes Survival Skills Education) will improve Outcome: Progressing Goal: Individualized Educational Video(s) Outcome: Progressing   Problem: Coping: Goal: Ability to adjust to condition or change in health will improve Outcome: Progressing   Problem: Fluid Volume: Goal: Ability to maintain a balanced intake and output will improve Outcome: Progressing   Problem: Health Behavior/Discharge Planning: Goal: Ability to identify and utilize available resources and services will improve Outcome: Progressing Goal: Ability to manage health-related needs will improve Outcome: Progressing   Problem: Metabolic: Goal: Ability to maintain appropriate glucose levels will improve Outcome: Progressing   Problem: Nutritional: Goal: Maintenance of adequate nutrition will improve Outcome: Progressing Goal: Progress toward achieving an optimal weight will improve Outcome: Progressing   Problem: Skin Integrity: Goal: Risk for impaired skin integrity will decrease Outcome: Progressing   Problem: Tissue Perfusion: Goal: Adequacy of tissue perfusion will improve Outcome: Progressing   Problem: Education: Goal: Knowledge of General Education information will improve Description: Including pain rating scale, medication(s)/side effects and non-pharmacologic comfort measures Outcome: Progressing   Problem: Health Behavior/Discharge Planning: Goal: Ability to manage health-related needs will improve Outcome: Progressing   Problem: Clinical Measurements: Goal: Ability to maintain clinical measurements within normal limits will improve Outcome: Progressing Goal: Will remain free from infection Outcome: Progressing Goal: Diagnostic test results will improve Outcome: Progressing Goal: Respiratory complications will  improve Outcome: Progressing Goal: Cardiovascular complication will be avoided Outcome: Progressing   Problem: Activity: Goal: Risk for activity intolerance will decrease Outcome: Progressing   Problem: Nutrition: Goal: Adequate nutrition will be maintained Outcome: Progressing   Problem: Coping: Goal: Level of anxiety will decrease Outcome: Progressing   Problem: Elimination: Goal: Will not experience complications related to bowel motility Outcome: Progressing Goal: Will not experience complications related to urinary retention Outcome: Progressing   Problem: Pain Management: Goal: General experience of comfort will improve Outcome: Progressing   Problem: Safety: Goal: Ability to remain free from injury will improve Outcome: Progressing   Problem: Skin Integrity: Goal: Risk for impaired skin integrity will decrease Outcome: Progressing   Problem: Education: Goal: Knowledge of disease or condition will improve Outcome: Progressing Goal: Knowledge of secondary prevention will improve (MUST DOCUMENT ALL) Outcome: Progressing Goal: Knowledge of patient specific risk factors will improve Loraine Leriche N/A or DELETE if not current risk factor) Outcome: Progressing

## 2023-02-10 NOTE — ED Notes (Signed)
ED TO INPATIENT HANDOFF REPORT  ED Nurse Name and Phone #: Yancey Flemings [phone]  S Name/Age/Gender Olivia Burnett 72 y.o. female Room/Bed: 042C/042C  Code Status   Code Status: Limited: Do not attempt resuscitation (DNR) -DNR-LIMITED -Do Not Intubate/DNI   Home/SNF/Other  Patient oriented to: self, place, time, and situation Is this baseline? Yes   Triage Complete: Triage complete  Chief Complaint Acute left-sided weakness [R53.1]  Triage Note No notes on file   Allergies Allergies  Allergen Reactions   Ciprofloxacin     Per MAR   Gemifloxacin     Per MAR   Infliximab Other (See Comments)    Unknown reaction  unknown   Levaquin [Levofloxacin]     Per MAR   Meperidine Other (See Comments)    Unknown reaction  unknown   Mercury     hives   Moxifloxacin     Per MAR   Ofloxacin     Per MAR   Penicillins     rash   Phenobarbital Other (See Comments)    Unknown reaction  unknown   Pregabalin Other (See Comments)    Dry mouth, confusion, incontinence   Prochlorperazine Other (See Comments)    Unknown reaction  unknown    Level of Care/Admitting Diagnosis ED Disposition     ED Disposition  Admit   Condition  --   Comment  Hospital Area: [facility] [[id number]]  Level of Care: Progressive [102]  Admit to Progressive based on following criteria: NEUROLOGICAL AND NEUROSURGICAL complex patients with significant risk of instability, who do not meet ICU criteria, yet require close observation or frequent assessment (< / = every 2 - 4 hours) with medical / nursing intervention.  May admit patient to [facility] or [facility] if equivalent level of care is available:: No  Covid Evaluation: Asymptomatic - no recent exposure (last 10 days) testing not required  Diagnosis: Acute left-sided weakness [360757]  Admitting Physician: Gerrit Heck [[id number]]  Attending Physician: Caro Laroche [[id number]]  Certification:: I certify this  patient will need inpatient services for at least 2 midnights  Expected Medical Readiness: 02/11/2023          B Medical/Surgery History Past Medical History:  Diagnosis Date   Diabetes mellitus without complication (HCC)    Ganglion cyst    Hypertension    Seizures (HCC)    Stroke [facility])    Past Surgical History:  Procedure Laterality Date   ABDOMINAL HYSTERECTOMY     APPENDECTOMY     TONSILLECTOMY       A IV Location/Drains/Wounds Patient Lines/Drains/Airways Status     Active Line/Drains/Airways     Name Placement date Placement time Site Days   Peripheral IV 02/09/23 18 G Left Antecubital 02/09/23  1344  Antecubital  1            Intake/Output Last 24 hours No intake or output data in the 24 hours ending 02/10/23 1251  Labs/Imaging Results for orders placed or performed during the hospital encounter of 02/09/23 (from the past 48 hours)  Protime-INR     Status: None   Collection Time: 02/09/23  1:14 PM  Result Value Ref Range   Prothrombin Time 14.2 11.4 - 15.2 seconds   INR 1.1 0.8 - 1.2    Comment: (NOTE) INR goal varies based on device and disease states. Performed at [facility], [address], [place], [place] [address]   APTT     Status: None  Collection Time: 02/09/23  1:14 PM  Result Value Ref Range   aPTT 28 24 - 36 seconds    Comment: Performed at [facility], [address], [place], [place] [address]  CBC     Status: Abnormal   Collection Time: 02/09/23  1:14 PM  Result Value Ref Range   WBC 7.6 4.0 - 10.5 K/uL   RBC 3.89 3.87 - 5.11 MIL/uL   Hemoglobin 12.0 12.0 - 15.0 g/dL   HCT 63.8 (L) 75.6 - 43.3 %   MCV 92.0 80.0 - 100.0 fL   MCH 30.8 26.0 - 34.0 pg   MCHC 33.5 30.0 - 36.0 g/dL   RDW 29.5 18.8 - 41.6 %   Platelets 127 (L) 150 - 400 K/uL    Comment: REPEATED TO VERIFY   nRBC 0.0 0.0 - 0.2 %    Comment: Performed at [facility], [address] [place], [place] [address]  Differential     Status: None    Collection Time: 02/09/23  1:14 PM  Result Value Ref Range   Neutrophils Relative % 56 %   Neutro Abs 4.3 1.7 - 7.7 K/uL   Lymphocytes Relative 28 %   Lymphs Abs 2.1 0.7 - 4.0 K/uL   Monocytes Relative 9 %   Monocytes Absolute 0.7 0.1 - 1.0 K/uL   Eosinophils Relative 5 %   Eosinophils Absolute 0.4 0.0 - 0.5 K/uL   Basophils Relative 1 %   Basophils Absolute 0.0 0.0 - 0.1 K/uL   Immature Granulocytes 1 %   Abs Immature Granulocytes 0.04 0.00 - 0.07 K/uL    Comment: Performed at [facility], [address] [place], [place] [address]  Comprehensive metabolic panel     Status: Abnormal   Collection Time: 02/09/23  1:14 PM  Result Value Ref Range   Sodium 137 135 - 145 mmol/L   Potassium 4.5 3.5 - 5.1 mmol/L   Chloride 107 98 - 111 mmol/L   CO2 21 (L) 22 - 32 mmol/L   Glucose, Bld 188 (H) 70 - 99 mg/dL    Comment: Glucose reference range applies only to samples taken after fasting for at least 8 hours.   BUN 23 8 - 23 mg/dL   Creatinine, Ser 9.32 (H) 0.44 - 1.00 mg/dL   Calcium 9.1 8.9 - 35.5 mg/dL   Total Protein 6.4 (L) 6.5 - 8.1 g/dL   Albumin 3.8 3.5 - 5.0 g/dL   AST 21 15 - 41 U/L   ALT 19 0 - 44 U/L   Alkaline Phosphatase 70 38 - 126 U/L   Total Bilirubin 0.7 <1.2 mg/dL   GFR, Estimated 38 (L) >60 mL/min    Comment: (NOTE) Calculated using the CKD-EPI Creatinine Equation (2021)    Anion gap 9 5 - 15    Comment: Performed at [facility], [address] [place], [place] [address]  Ethanol     Status: None   Collection Time: 02/09/23  1:14 PM  Result Value Ref Range   Alcohol, Ethyl (B) <10 <10 mg/dL    Comment: (NOTE) Lowest detectable limit for serum alcohol is 10 mg/dL.  For medical purposes only. Performed at [facility], [address] [place], [place] [address]   CBG monitoring, ED     Status: Abnormal   Collection Time: 02/09/23  1:14 PM  Result Value Ref Range   Glucose-Capillary 169 (H) 70 - 99 mg/dL    Comment: Glucose  reference range applies only to samples taken after fasting for at least 8 hours.  Hemoglobin A1c     Status: Abnormal   Collection Time: 02/09/23  1:14 PM  Result Value Ref Range   Hgb A1c MFr Bld 8.2 (H) 4.8 - 5.6 %    Comment: (NOTE) Pre diabetes:          5.7%-6.4%  Diabetes:              >6.4%  Glycemic control for   <7.0% adults with diabetes    Mean Plasma Glucose 188.64 mg/dL    Comment: Performed at [facility], [address] [place], [place] [address]  Lipid panel     Status: Abnormal   Collection Time: 02/09/23  1:14 PM  Result Value Ref Range   Cholesterol 142 0 - 200 mg/dL   Triglycerides 604 <540 mg/dL   HDL 36 (L) >98 mg/dL   Total CHOL/HDL Ratio 3.9 RATIO   VLDL 27 0 - 40 mg/dL   LDL Cholesterol 79 0 - 99 mg/dL    Comment:        Total Cholesterol/HDL:CHD Risk Coronary Heart Disease Risk Table                     Men   Women  1/2 Average Risk   3.4   3.3  Average Risk       5.0   4.4  2 X Average Risk   9.6   7.1  3 X Average Risk  23.4   11.0        Use the calculated Patient Ratio above and the CHD Risk Table to determine the patient's CHD Risk.        ATP III CLASSIFICATION (LDL):  <100     mg/dL   Optimal  119-147  mg/dL   Near or Above                    Optimal  130-159  mg/dL   Borderline  829-562  mg/dL   High  >130     mg/dL   Very High Performed at [facility], [address] [place], [place] [address]   CBG monitoring, ED     Status: Abnormal   Collection Time: 02/09/23  5:56 PM  Result Value Ref Range   Glucose-Capillary 204 (H) 70 - 99 mg/dL    Comment: Glucose reference range applies only to samples taken after fasting for at least 8 hours.  CBG monitoring, ED     Status: Abnormal   Collection Time: 02/09/23 10:44 PM  Result Value Ref Range   Glucose-Capillary 356 (H) 70 - 99 mg/dL    Comment: Glucose reference range applies only to samples taken after fasting for at least 8 hours.  Basic metabolic panel      Status: Abnormal   Collection Time: 02/10/23  4:46 AM  Result Value Ref Range   Sodium 134 (L) 135 - 145 mmol/L   Potassium 4.6 3.5 - 5.1 mmol/L   Chloride 103 98 - 111 mmol/L   CO2 21 (L) 22 - 32 mmol/L   Glucose, Bld 257 (H) 70 - 99 mg/dL    Comment: Glucose reference range applies only to samples taken after fasting for at least 8 hours.   BUN 29 (H) 8 - 23 mg/dL   Creatinine, Ser 4.69 (H) 0.44 - 1.00 mg/dL   Calcium 8.7 (L) 8.9 - 10.3 mg/dL  GFR, Estimated 40 (L) >60 mL/min    Comment: (NOTE) Calculated using the CKD-EPI Creatinine Equation (2021)    Anion gap 10 5 - 15    Comment: Performed at [facility], [address], [place], [place] [address]  CBC     Status: Abnormal   Collection Time: 02/10/23  4:46 AM  Result Value Ref Range   WBC 6.6 4.0 - 10.5 K/uL   RBC 3.67 (L) 3.87 - 5.11 MIL/uL   Hemoglobin 11.3 (L) 12.0 - 15.0 g/dL   HCT 60.4 (L) 54.0 - 98.1 %   MCV 89.9 80.0 - 100.0 fL   MCH 30.8 26.0 - 34.0 pg   MCHC 34.2 30.0 - 36.0 g/dL   RDW 19.1 47.8 - 29.5 %   Platelets 116 (L) 150 - 400 K/uL   nRBC 0.0 0.0 - 0.2 %    Comment: Performed at [facility], [address], [place], [place] [address]  CBG monitoring, ED     Status: Abnormal   Collection Time: 02/10/23  8:30 AM  Result Value Ref Range   Glucose-Capillary 307 (H) 70 - 99 mg/dL    Comment: Glucose reference range applies only to samples taken after fasting for at least 8 hours.  CBG monitoring, ED     Status: Abnormal   Collection Time: 02/10/23 11:43 AM  Result Value Ref Range   Glucose-Capillary 216 (H) 70 - 99 mg/dL    Comment: Glucose reference range applies only to samples taken after fasting for at least 8 hours.  Salicylate level     Status: Abnormal   Collection Time: 02/10/23 11:47 AM  Result Value Ref Range   Salicylate Lvl <7.0 (L) 7.0 - 30.0 mg/dL    Comment: Performed at [facility], [address], [place], [place] [address]   ECHOCARDIOGRAM COMPLETE Result Date:  02/10/2023    ECHOCARDIOGRAM REPORT   Patient Name:   Olivia Burnett Date of Exam: 02/10/2023 Medical Rec #:  [id number]       Height: Accession #:    [id number]      Weight:       164.9 lb Date of Birth:  12-24-1950       BSA:          1.776 m Patient Age:    72 years        BP:           159/63 mmHg Patient Gender: F               HR:           65 bpm. Exam Location:  Inpatient Procedure: 2D Echo, Cardiac Doppler, Color Doppler and Intracardiac            Opacification Agent Indications:    Stroke  History:        Patient has no prior history of Echocardiogram examinations.  Sonographer:    Melton Krebs RDCS, FE, PE Referring Phys: [id number] ADAM CURATOLO  Sonographer Comments: Suboptimal subcostal window. IMPRESSIONS  1. Left ventricular ejection fraction, by estimation, is 65 to 70%. The left ventricle has normal function. The left ventricle has no regional wall motion abnormalities. Left ventricular diastolic parameters are consistent with Grade II diastolic dysfunction (pseudonormalization).  2. Right ventricular systolic function is normal. The right ventricular size is normal.  3. The mitral valve is normal in structure. Trivial mitral valve regurgitation. No evidence of mitral stenosis.  4. The aortic valve is tricuspid.  There is mild calcification of the aortic valve. Aortic valve regurgitation is not visualized. Aortic valve sclerosis/calcification is present, without any evidence of aortic stenosis.  5. The inferior vena cava is normal in size with greater than 50% respiratory variability, suggesting right atrial pressure of 3 mmHg. FINDINGS  Left Ventricle: Left ventricular ejection fraction, by estimation, is 65 to 70%. The left ventricle has normal function. The left ventricle has no regional wall motion abnormalities. Definity contrast agent was given IV to delineate the left ventricular  endocardial borders. The left ventricular internal cavity size was normal in size. There is no left ventricular  hypertrophy. Left ventricular diastolic parameters are consistent with Grade II diastolic dysfunction (pseudonormalization). Right Ventricle: The right ventricular size is normal. No increase in right ventricular wall thickness. Right ventricular systolic function is normal. Left Atrium: Left atrial size was normal in size. Right Atrium: Right atrial size was normal in size. Pericardium: There is no evidence of pericardial effusion. Mitral Valve: The mitral valve is normal in structure. Trivial mitral valve regurgitation. No evidence of mitral valve stenosis. Tricuspid Valve: The tricuspid valve is normal in structure. Tricuspid valve regurgitation is trivial. No evidence of tricuspid stenosis. Aortic Valve: The aortic valve is tricuspid. There is mild calcification of the aortic valve. Aortic valve regurgitation is not visualized. Aortic valve sclerosis/calcification is present, without any evidence of aortic stenosis. Aortic valve mean gradient measures 5.0 mmHg. Aortic valve peak gradient measures 9.3 mmHg. Aortic valve area, by VTI measures 2.18 cm. Pulmonic Valve: The pulmonic valve was normal in structure. Pulmonic valve regurgitation is trivial. No evidence of pulmonic stenosis. Aorta: The aortic root is normal in size and structure. Venous: The inferior vena cava is normal in size with greater than 50% respiratory variability, suggesting right atrial pressure of 3 mmHg. IAS/Shunts: No atrial level shunt detected by color flow Doppler.  LEFT VENTRICLE PLAX 2D LVIDd:         4.90 cm   Diastology LVIDs:         3.20 cm   LV e' medial:    3.81 cm/s LV PW:         1.00 cm   LV E/e' medial:  24.3 LV IVS:        1.10 cm   LV e' lateral:   4.79 cm/s LVOT diam:     2.10 cm   LV E/e' lateral: 19.3 LV SV:         73 LV SV Index:   41 LVOT Area:     3.46 cm  RIGHT VENTRICLE RV S prime:     13.60 cm/s TAPSE (M-mode): 1.5 cm LEFT ATRIUM           Index        RIGHT ATRIUM           Index LA diam:      3.60 cm 2.03 cm/m    RA Area:     16.70 cm LA Vol (A4C): 51.4 ml 28.95 ml/m  RA Volume:   42.80 ml  24.11 ml/m  AORTIC VALVE AV Area (Vmax):    2.18 cm AV Area (Vmean):   2.16 cm AV Area (VTI):     2.18 cm AV Vmax:           152.50 cm/s AV Vmean:          110.500 cm/s AV VTI:            0.337 m AV Peak Grad:  9.3 mmHg AV Mean Grad:      5.0 mmHg LVOT Vmax:         96.10 cm/s LVOT Vmean:        69.000 cm/s LVOT VTI:          0.212 m LVOT/AV VTI ratio: 0.63  AORTA Ao Root diam: 3.00 cm MITRAL VALVE MV Area (PHT): 2.73 cm    SHUNTS MV Decel Time: 278 msec    Systemic VTI:  0.21 m MV E velocity: 92.60 cm/s  Systemic Diam: 2.10 cm MV A velocity: 91.20 cm/s MV E/A ratio:  1.02 Arvilla Meres MD Electronically signed by Arvilla Meres MD Signature Date/Time: 02/10/2023/10:08:08 AM    Final    MR BRAIN WO CONTRAST Result Date: 02/09/2023 CLINICAL DATA:  Left-sided weakness, left facial droop, aphasia, and dysarthria; history of stroke and seizures EXAM: MRI HEAD WITHOUT CONTRAST TECHNIQUE: Multiplanar, multiecho pulse sequences of the brain and surrounding structures were obtained without intravenous contrast. COMPARISON:  MRI head 03/13/2015, correlation is also made with 02/09/2023 CT head FINDINGS: Brain: No restricted diffusion to suggest acute or subacute infarct. No acute hemorrhage, mass, mass effect, or midline shift. No hydrocephalus or extra-axial collection. Pituitary and craniocervical junction within normal limits. Hemosiderin deposition in the bilateral basal ganglia, most likely petechial hemorrhage associated with remote infarcts. Additional remote lacunar infarcts in the thalami, pons, and cerebellar hemispheres. Confluent T2 hyperintense signal in the periventricular white matter, likely the sequela of moderate to severe chronic small vessel ischemic disease. Advanced cerebral volume loss for age. Vascular: Normal arterial flow voids. Skull and upper cervical spine: Normal marrow signal. Sinuses/Orbits:  Mucosal thickening in the ethmoid air cells. No acute finding in the orbits. Status post bilateral lens replacements. Other: The mastoid air cells are well aerated. IMPRESSION: No acute intracranial process. No evidence of acute or subacute infarct. Electronically Signed   By: Wiliam Ke M.D.   On: 02/09/2023 19:05   EEG adult Result Date: 02/09/2023 Charlsie Quest, MD     02/09/2023  3:42 PM Patient Name: Olivia Burnett MRN: [id number] Epilepsy Attending: Charlsie Quest Referring Physician/Provider: Lynnae January, NP Date: 02/09/2023 Duration: 26.50 mins Patient history: 72yo F with left sided weakness getting eeg to evaluate for seizure Level of alertness: Awake AEDs during EEG study: None Technical aspects: This EEG study was done with scalp electrodes positioned according to the 10-20 International system of electrode placement. Electrical activity was reviewed with band pass filter of 1-70Hz , sensitivity of 7 uV/mm, display speed of 73mm/sec with a 60Hz  notched filter applied as appropriate. EEG data were recorded continuously and digitally stored.  Video monitoring was available and reviewed as appropriate. Description: The posterior dominant rhythm consists of 7.5 Hz activity of moderate voltage (25-35 uV) seen predominantly in posterior head regions, symmetric and reactive to eye opening and eye closing. EEG showed intermittent generalized 3 to 6 Hz theta-delta slowing. Hyperventilation and photic stimulation were not performed.   ABNORMALITY - Intermittent slow, generalized IMPRESSION: This study is suggestive of mild diffuse encephalopathy. No seizures or epileptiform discharges were seen throughout the recording. Priyanka Annabelle Harman   CT ANGIO HEAD NECK W WO CM W PERF (CODE STROKE) Result Date: 02/09/2023 CLINICAL DATA:  Neuro deficit, acute, stroke suspected. Left-sided weakness, aphasia and slurred speech. EXAM: CT ANGIOGRAPHY HEAD AND NECK CT PERFUSION BRAIN TECHNIQUE: Multidetector CT  imaging of the head and neck was performed using the standard protocol during bolus administration of intravenous contrast. Multiplanar CT image reconstructions  and MIPs were obtained to evaluate the vascular anatomy. Carotid stenosis measurements (when applicable) are obtained utilizing NASCET criteria, using the distal internal carotid diameter as the denominator. Multiphase CT imaging of the brain was performed following IV bolus contrast injection. Subsequent parametric perfusion maps were calculated using RAPID software. RADIATION DOSE REDUCTION: This exam was performed according to the departmental dose-optimization program which includes automated exposure control, adjustment of the mA and/or kV according to patient size and/or use of iterative reconstruction technique. CONTRAST:  OMNIPAQUE IOHEXOL 350 MG/ML SOLN COMPARISON:  Head CT 02/09/2023.  CTA head/neck 03/15/2017. FINDINGS: CTA NECK FINDINGS Aortic arch: Three-vessel arch configuration. Arch vessel origins are patent. Right carotid system: No evidence of dissection, stenosis (50% or greater) or occlusion. Extensive calcified plaque along the right carotid bulb and proximal right cervical ICA. Left carotid system: No evidence of dissection, stenosis (50% or greater) or occlusion. Moderate mixed plaque along the left carotid bulb and proximal left cervical ICA. Vertebral arteries: Severe stenosis of the non dominant right vertebral artery origin. Mixed plaque along the proximal left vertebral artery V1 segment without hemodynamically significant stenosis. Calcified plaque along the left V4 segment results in severe stenosis. The right vertebral artery functionally terminates in PICA with severe stenosis of the proximal right V4 segment. Skeleton: Mild cervical spondylosis without high-grade spinal canal stenosis. Other neck: Unremarkable. Upper chest: Unremarkable. Review of the MIP images confirms the above findings CTA HEAD FINDINGS Anterior  circulation: Unchanged extensive atherosclerotic calcifications of the bilateral carotid siphons with at least moderate stenosis of the bilateral cavernous segments. Unchanged multifocal stenosis of the right MCA vessels, notable for mild proximal M1 stenosis, severe stenosis of the inferior M2 division and beaded appearance of more distal intracranial branches of the right MCA. Unchanged multifocal stenosis of the left MCA vessels, notable for severe stenosis of the proximal inferior M2 division and mid superior M2 division. Mild stenosis of the right ACA, A1 segment. Posterior circulation: Diminutive basilar artery. The SCAs, AICAs and PICAs are patent proximally. Unchanged multifocal stenosis of the left PCA, including severe stenosis at the left P1-P2 junction and severe stenosis of the proximal left P2 segment. Multifocal mild stenosis of the right PCA P2 and P3 segments. Asymmetric paucity of left PCA branches. Venous sinuses: As permitted by contrast timing, patent. Anatomic variants: None. Review of the MIP images confirms the above findings CT Brain Perfusion Findings: CBF (<30%) Volume: 0mL Perfusion (Tmax>6.0s) volume: 3mL Mismatch Volume: 3mL Ischemia location:Left PCA territory. IMPRESSION: 1. No large vessel occlusion. 2. Unchanged extensive intracranial atherosclerotic disease, including severe stenosis of the bilateral MCAs and left PCA. 3. Unchanged severe stenosis of the non dominant right vertebral artery origin and proximal right V4 segment. 4. No hemodynamically significant stenosis of the cervical carotid arteries. 5. 3 mL of ischemia in the left PCA territory by automated post processing, corresponding to old infarct. No core infarct. Code stroke imaging results were communicated on 02/09/2023 at 1:37 pm to provider Dr. Pearlean Brownie via secure text paging. Electronically Signed   By: Orvan Falconer M.D.   On: 02/09/2023 13:55   CT HEAD CODE STROKE WO CONTRAST Result Date: 02/09/2023 CLINICAL  DATA:  Code stroke. Neuro deficit, acute, stroke suspected. Left-sided weakness, aphasia and slurred speech. EXAM: CT HEAD WITHOUT CONTRAST TECHNIQUE: Contiguous axial images were obtained from the base of the skull through the vertex without intravenous contrast. RADIATION DOSE REDUCTION: This exam was performed according to the departmental dose-optimization program which includes automated exposure control, adjustment  of the mA and/or kV according to patient size and/or use of iterative reconstruction technique. COMPARISON:  Head CT 07/26/2022. FINDINGS: Brain: No acute hemorrhage. Unchanged severe chronic small-vessel disease with numerous old perforator infarcts in the deep cerebral white matter, bilateral basal ganglia, bilateral thalami, right midbrain and right cerebellar hemisphere. Old cortical infarct in the medial aspect of the left occipital lobe. No new loss of cortical gray-white differentiation. No hydrocephalus or extra-axial collection. No mass effect or midline shift. Vascular: No hyperdense vessel or unexpected calcification. Skull: No calvarial fracture or suspicious bone lesion. Skull base is unremarkable. Sinuses/Orbits: No acute finding. Other: None. ASPECTS [facility] Stroke Program Early CT Score) - Ganglionic level infarction (caudate, lentiform nuclei, internal capsule, insula, M1-M3 cortex): 7 - Supraganglionic infarction (M4-M6 cortex): 3 Total score (0-10 with 10 being normal): 10 IMPRESSION: 1. No acute intracranial hemorrhage or evidence of acute large vessel territory infarct. ASPECT score is 10. 2. Unchanged severe chronic small-vessel disease with numerous old perforator infarcts as described above. Code stroke imaging results were communicated on 02/09/2023 at 1:37 pm to provider Dr. Pearlean Brownie via secure text paging. Electronically Signed   By: Orvan Falconer M.D.   On: 02/09/2023 13:37    Pending Labs Unresulted Labs (From admission, onward)     Start     Ordered   02/11/23  0500  Basic metabolic panel  Tomorrow morning,   R        02/10/23 1026   02/11/23 0500  Magnesium  Tomorrow morning,   R        02/10/23 1026   02/11/23 0500  CBC  Tomorrow morning,   R        02/10/23 1026   02/10/23 1400  Vitamin B12  Once,   R        02/10/23 1026   02/10/23 1400  Folate  Once,   R        02/10/23 1026   02/10/23 1400  RPR  Once,   R        02/10/23 1026   02/10/23 1400  HIV Antibody (routine testing w rflx)  (HIV Antibody (Routine testing w reflex) panel)  Once,   R        02/10/23 1026   02/10/23 1400  CBC  Once,   R        02/10/23 1026   02/10/23 0807  Rapid urine drug screen (hospital performed)  Once,   R        02/10/23 0807   02/09/23 1552  Urinalysis, Routine w reflex microscopic -Urine, Clean Catch  Once,   R       Question:  Specimen Source  Answer:  Urine, Clean Catch   02/09/23 1557            Vitals/Pain Today's Vitals   02/10/23 0834 02/10/23 0905 02/10/23 1220 02/10/23 1223  BP:  (!) 159/63 (!) 139/55   Pulse:  70 64   Resp:  16 16   Temp: 98.7 F (37.1 C)   97.9 F (36.6 C)  TempSrc: Oral   Oral  SpO2:  100% 100%   Weight:      PainSc:        Isolation Precautions No active isolations  Medications Medications  sodium chloride flush (NS) 0.9 % injection 3 mL (3 mLs Intravenous Not Given 02/09/23 1347)  acetaminophen (TYLENOL) tablet 1,000 mg (1,000 mg Oral Given 02/09/23 1803)  aspirin EC tablet 81 mg (81 mg Oral Given 02/10/23  2956)  hydroxychloroquine (PLAQUENIL) tablet 200 mg (200 mg Oral Given 02/10/23 1034)  atorvastatin (LIPITOR) tablet 5 mg (5 mg Oral Given 02/09/23 2144)  sertraline (ZOLOFT) tablet 75 mg (75 mg Oral Given 02/10/23 0911)  polyethylene glycol (MIRALAX / GLYCOLAX) packet 17 g (17 g Oral Given 02/10/23 0911)  gabapentin (NEURONTIN) capsule 200 mg (200 mg Oral Given 02/10/23 0910)  enoxaparin (LOVENOX) injection 40 mg (40 mg Subcutaneous Given 02/09/23 2147)  insulin aspart (novoLOG) injection 0-9 Units  (3 Units Subcutaneous Given 02/10/23 1200)  amLODipine (NORVASC) tablet 10 mg (10 mg Oral Given 02/10/23 0910)  lisinopril (ZESTRIL) tablet 10 mg (10 mg Oral Given 02/10/23 0910)  perflutren lipid microspheres (DEFINITY) IV suspension (3 mLs Intravenous Given 02/10/23 0958)  insulin glargine-yfgn (SEMGLEE) injection 20 Units (has no administration in time range)  valbenazine (INGREZZA) capsule 40 mg (40 mg Oral Given 02/10/23 1035)  isosorbide mononitrate (IMDUR) 24 hr tablet 30 mg (30 mg Oral Given 02/10/23 1038)  metoprolol tartrate (LOPRESSOR) tablet 12.5 mg (12.5 mg Oral Given 02/10/23 1034)  iohexol (OMNIPAQUE) 350 MG/ML injection 100 mL (100 mLs Intravenous Contrast Given 02/09/23 1335)  LORazepam (ATIVAN) injection 1 mg (1 mg Intravenous Given 02/09/23 1611)    Mobility non-ambulatory     Focused Assessments Neuro Assessment Handoff:  Swallow screen pass? Yes  Cardiac Rhythm: Normal sinus rhythm NIH Stroke Scale  Dizziness Present: No Headache Present: No Interval: Shift assessment Level of Consciousness (1a.)   : Alert, keenly responsive LOC Questions (1b. )   : Answers one question correctly LOC Commands (1c. )   : Performs both tasks correctly Best Gaze (2. )  : Normal Visual (3. )  : No visual loss Facial Palsy (4. )    : Minor paralysis Motor Arm, Left (5a. )   : No drift Motor Arm, Right (5b. ) : No drift Motor Leg, Left (6a. )  : Drift Motor Leg, Right (6b. ) : No drift Limb Ataxia (7. ): Absent Sensory (8. )  : Mild-to-moderate sensory loss, patient feels pinprick is less sharp or is dull on the affected side, or there is a loss of superficial pain with pinprick, but patient is aware of being touched Best Language (9. )  : No aphasia Dysarthria (10. ): Normal Extinction/Inattention (11.)   : No Abnormality Complete NIHSS TOTAL: 4 Last date known well: 02/09/23 Last time known well: 0830 Neuro Assessment: Exceptions to WDL Neuro Checks:   Initial (02/09/23  1315)  Has TPA been given? Yes Temp: 97.9 F (36.6 C) (12/18 1223) Temp Source: Oral (12/18 1223) BP: 139/55 (12/18 1220) Pulse Rate: 64 (12/18 1220) If patient is a Neuro Trauma and patient is going to OR before floor call report to 4N Charge nurse: [phone] or [phone]   R Recommendations: See Admitting Provider Note  Report given to:   Additional Notes:

## 2023-02-10 NOTE — TOC Initial Note (Signed)
Transition of Care [facility]) - Initial/Assessment Note    Patient Details  Name: Olivia Burnett MRN: [id number] Date of Birth: 1950-09-05  [facility] CM/SW Contact:    Baldemar Lenis, LCSW Phone Number: 02/10/2023, 3:53 PM  Clinical Narrative:      Patient is a LTC resident at [facility], is able to return when stable. CSW to follow.             Expected Discharge Plan: Skilled Nursing Facility Barriers to Discharge: Continued Medical Work up   Patient Goals and CMS Choice Patient states their goals for this hospitalization and ongoing recovery are:: patient unable to participate in goal setting, not fully oriented CMS Medicare.gov Compare Post Acute Care list provided to:: Patient Choice offered to / list presented to : Patient [place] ownership interest in [facility].provided to:: Patient    Expected Discharge Plan and Services     Post Acute Care Choice: Skilled Nursing Facility Living arrangements for the past 2 months: Skilled Nursing Facility                                      Prior Living Arrangements/Services Living arrangements for the past 2 months: Skilled Nursing Facility Lives with:: Facility Resident Patient language and need for interpreter reviewed:: No Do you feel safe going back to the place where you live?: Yes      Need for Family Participation in Patient Care: Yes (Comment) Care giver support system in place?: Yes (comment)   Criminal Activity/Legal Involvement Pertinent to Current Situation/Hospitalization: No - Comment as needed  Activities of Daily Living   ADL Screening (condition at time of admission) Independently performs ADLs?: No Does the patient have a NEW difficulty with bathing/dressing/toileting/self-feeding that is expected to last >3 days?: Yes (Initiates electronic notice to provider for possible OT consult) (stroke work up) Does the patient have a NEW difficulty with getting in/out of  bed, walking, or climbing stairs that is expected to last >3 days?: Yes (Initiates electronic notice to provider for possible PT consult) (stroke work up) Does the patient have a NEW difficulty with communication that is expected to last >3 days?: No Is the patient deaf or have difficulty hearing?: Yes Does the patient have difficulty seeing, even when wearing glasses/contacts?: No Does the patient have difficulty concentrating, remembering, or making decisions?: Yes  Permission Sought/Granted Permission sought to share information with : Facility Medical sales representative, Family Supports Permission granted to share information with : Yes, Verbal Permission Granted  Share Information with NAME: Amy  Permission granted to share info w AGENCY: SNF  Permission granted to share info w Relationship: Daughter     Emotional Assessment   Attitude/Demeanor/Rapport: Unable to Assess Affect (typically observed): Unable to Assess Orientation: : Oriented to Self, Oriented to Place, Oriented to  Time Alcohol / Substance Use: Not Applicable Psych Involvement: No (comment)  Admission diagnosis:  Acute left-sided weakness [R53.1] Stroke-like episode [R29.90] Patient Active Problem List   Diagnosis Date Noted   Acute left-sided weakness 02/09/2023   AKI (acute kidney injury) (HCC) 02/09/2023   Elevated serum creatinine 02/09/2023   PCP:  Pcp, No Pharmacy:  No Pharmacies Listed    Social Drivers of Health (SDOH) Social History: SDOH Screenings   Food Insecurity: No Food Insecurity (02/09/2023)  Housing: Low Risk  (02/09/2023)  Transportation Needs: No Transportation Needs (02/09/2023)  Utilities: Not At Risk (  02/09/2023)  Tobacco Use: Unknown (02/09/2023)   SDOH Interventions:     Readmission Risk Interventions     No data to display

## 2023-02-10 NOTE — Assessment & Plan Note (Addendum)
MRI yesterday showed no acute infarcts, EEG showed encephalopathy wave pattern but no evidence of seizure or epileptiform waves.  Will follow-up labs ordered on admission, and follow neurology recommendations for further testing. -Neurology is following, appreciate their recommendations -Echo pending, follow-up -vitals per floor -telemetry -PT/OT/SLP eval and treat -AM CBC, BMP, Mag -fall precautions -delirium precautions -A1c, lipid panel-pending -Reversible causes workup:  B12, folate, UDS, salicylate, RPR

## 2023-02-10 NOTE — ED Notes (Signed)
Patient incontinent of urine. RN cleaned patient, placed new brief, and repositioned.

## 2023-02-10 NOTE — Progress Notes (Signed)
OT Cancellation Note  Patient Details Name: Olivia Burnett MRN: [id number] DOB: [birth year]   Cancelled Treatment:    Reason Eval/Treat Not Completed: Patient at procedure or test/ unavailable Patient at [facility]. OT will follow back as time permits to complete evaluation.   Pollyann Glen E. Kaius Daino, OTR/L Acute Rehabilitation Services [phone]   [name] 02/10/2023, 10:01 AM

## 2023-02-10 NOTE — ED Notes (Signed)
ED TO INPATIENT HANDOFF REPORT  ED Nurse Name and Phone #: Donnal Debar [phone]  S Name/Age/Gender Olivia Burnett 72 y.o. female Room/Bed: 042C/042C  Code Status   Code Status: Limited: Do not attempt resuscitation (DNR) -DNR-LIMITED -Do Not Intubate/DNI   Home/SNF/Other Nursing Home Patient oriented to: self and place Is this baseline? No   Triage Complete: Triage complete  Chief Complaint Acute left-sided weakness [R53.1]  Triage Note No notes on file   Allergies Allergies  Allergen Reactions   Ciprofloxacin     Per MAR   Gemifloxacin     Per MAR   Infliximab Other (See Comments)    Unknown reaction  unknown   Levaquin [Levofloxacin]     Per MAR   Meperidine Other (See Comments)    Unknown reaction  unknown   Mercury     hives   Moxifloxacin     Per MAR   Ofloxacin     Per MAR   Penicillins     rash   Phenobarbital Other (See Comments)    Unknown reaction  unknown   Pregabalin Other (See Comments)    Dry mouth, confusion, incontinence   Prochlorperazine Other (See Comments)    Unknown reaction  unknown    Level of Care/Admitting Diagnosis ED Disposition     ED Disposition  Admit   Condition  --   Comment  Hospital Area: [facility] [[id number]]  Level of Care: Progressive [102]  Admit to Progressive based on following criteria: NEUROLOGICAL AND NEUROSURGICAL complex patients with significant risk of instability, who do not meet ICU criteria, yet require close observation or frequent assessment (< / = every 2 - 4 hours) with medical / nursing intervention.  May admit patient to [facility] or [facility] if equivalent level of care is available:: No  Covid Evaluation: Asymptomatic - no recent exposure (last 10 days) testing not required  Diagnosis: Acute left-sided weakness [360757]  Admitting Physician: Gerrit Heck [[id number]]  Attending Physician: Caro Laroche [[id number]]  Certification:: I certify this patient will  need inpatient services for at least 2 midnights  Expected Medical Readiness: 02/11/2023          B Medical/Surgery History Past Medical History:  Diagnosis Date   Diabetes mellitus without complication (HCC)    Ganglion cyst    Hypertension    Seizures (HCC)    [facility]    Past Surgical History:  Procedure Laterality Date   ABDOMINAL HYSTERECTOMY     APPENDECTOMY     TONSILLECTOMY       A IV Location/Drains/Wounds Patient Lines/Drains/Airways Status     Active Line/Drains/Airways     Name Placement date Placement time Site Days   Peripheral IV 02/09/23 18 G Left Antecubital 02/09/23  1344  Antecubital  1            Intake/Output Last 24 hours No intake or output data in the 24 hours ending 02/10/23 4259  Labs/Imaging Results for orders placed or performed during the hospital encounter of 02/09/23 (from the past 48 hours)  Protime-INR     Status: None   Collection Time: 02/09/23  1:14 PM  Result Value Ref Range   Prothrombin Time 14.2 11.4 - 15.2 seconds   INR 1.1 0.8 - 1.2    Comment: (NOTE) INR goal varies based on device and disease states. Performed at [facility], [address], [place], [place] [address]   APTT     Status: None  Collection Time: 02/09/23  1:14 PM  Result Value Ref Range   aPTT 28 24 - 36 seconds    Comment: Performed at [facility], [address], [place], [place] [address]  CBC     Status: Abnormal   Collection Time: 02/09/23  1:14 PM  Result Value Ref Range   WBC 7.6 4.0 - 10.5 K/uL   RBC 3.89 3.87 - 5.11 MIL/uL   Hemoglobin 12.0 12.0 - 15.0 g/dL   HCT 24.4 (L) 01.0 - 27.2 %   MCV 92.0 80.0 - 100.0 fL   MCH 30.8 26.0 - 34.0 pg   MCHC 33.5 30.0 - 36.0 g/dL   RDW 53.6 64.4 - 03.4 %   Platelets 127 (L) 150 - 400 K/uL    Comment: REPEATED TO VERIFY   nRBC 0.0 0.0 - 0.2 %    Comment: Performed at [facility], [address] [place], [place] [address]  Differential     Status: None   Collection  Time: 02/09/23  1:14 PM  Result Value Ref Range   Neutrophils Relative % 56 %   Neutro Abs 4.3 1.7 - 7.7 K/uL   Lymphocytes Relative 28 %   Lymphs Abs 2.1 0.7 - 4.0 K/uL   Monocytes Relative 9 %   Monocytes Absolute 0.7 0.1 - 1.0 K/uL   Eosinophils Relative 5 %   Eosinophils Absolute 0.4 0.0 - 0.5 K/uL   Basophils Relative 1 %   Basophils Absolute 0.0 0.0 - 0.1 K/uL   Immature Granulocytes 1 %   Abs Immature Granulocytes 0.04 0.00 - 0.07 K/uL    Comment: Performed at [facility], [address] [place], [place] [address]  Comprehensive metabolic panel     Status: Abnormal   Collection Time: 02/09/23  1:14 PM  Result Value Ref Range   Sodium 137 135 - 145 mmol/L   Potassium 4.5 3.5 - 5.1 mmol/L   Chloride 107 98 - 111 mmol/L   CO2 21 (L) 22 - 32 mmol/L   Glucose, Bld 188 (H) 70 - 99 mg/dL    Comment: Glucose reference range applies only to samples taken after fasting for at least 8 hours.   BUN 23 8 - 23 mg/dL   Creatinine, Ser 5.64 (H) 0.44 - 1.00 mg/dL   Calcium 9.1 8.9 - 33.2 mg/dL   Total Protein 6.4 (L) 6.5 - 8.1 g/dL   Albumin 3.8 3.5 - 5.0 g/dL   AST 21 15 - 41 U/L   ALT 19 0 - 44 U/L   Alkaline Phosphatase 70 38 - 126 U/L   Total Bilirubin 0.7 <1.2 mg/dL   GFR, Estimated 38 (L) >60 mL/min    Comment: (NOTE) Calculated using the CKD-EPI Creatinine Equation (2021)    Anion gap 9 5 - 15    Comment: Performed at [facility], [address], [place], [place] [address]  Ethanol     Status: None   Collection Time: 02/09/23  1:14 PM  Result Value Ref Range   Alcohol, Ethyl (B) <10 <10 mg/dL    Comment: (NOTE) Lowest detectable limit for serum alcohol is 10 mg/dL.  For medical purposes only. Performed at [facility], [address] [place], [place] [address]   CBG monitoring, ED     Status: Abnormal   Collection Time: 02/09/23  1:14 PM  Result Value Ref Range   Glucose-Capillary 169 (H) 70 - 99 mg/dL    Comment: Glucose  reference range  applies only to samples taken after fasting for at least 8 hours.  Hemoglobin A1c     Status: Abnormal   Collection Time: 02/09/23  1:14 PM  Result Value Ref Range   Hgb A1c MFr Bld 8.2 (H) 4.8 - 5.6 %    Comment: (NOTE) Pre diabetes:          5.7%-6.4%  Diabetes:              >6.4%  Glycemic control for   <7.0% adults with diabetes    Mean Plasma Glucose 188.64 mg/dL    Comment: Performed at [facility], [address], [place], [place] [address]  Lipid panel     Status: Abnormal   Collection Time: 02/09/23  1:14 PM  Result Value Ref Range   Cholesterol 142 0 - 200 mg/dL   Triglycerides 604 <540 mg/dL   HDL 36 (L) >98 mg/dL   Total CHOL/HDL Ratio 3.9 RATIO   VLDL 27 0 - 40 mg/dL   LDL Cholesterol 79 0 - 99 mg/dL    Comment:        Total Cholesterol/HDL:CHD Risk Coronary Heart Disease Risk Table                     Men   Women  1/2 Average Risk   3.4   3.3  Average Risk       5.0   4.4  2 X Average Risk   9.6   7.1  3 X Average Risk  23.4   11.0        Use the calculated Patient Ratio above and the CHD Risk Table to determine the patient's CHD Risk.        ATP III CLASSIFICATION (LDL):  <100     mg/dL   Optimal  119-147  mg/dL   Near or Above                    Optimal  130-159  mg/dL   Borderline  829-562  mg/dL   High  >130     mg/dL   Very High Performed at [facility], [address] [place], [place] [address]   CBG monitoring, ED     Status: Abnormal   Collection Time: 02/09/23  5:56 PM  Result Value Ref Range   Glucose-Capillary 204 (H) 70 - 99 mg/dL    Comment: Glucose reference range applies only to samples taken after fasting for at least 8 hours.  CBG monitoring, ED     Status: Abnormal   Collection Time: 02/09/23 10:44 PM  Result Value Ref Range   Glucose-Capillary 356 (H) 70 - 99 mg/dL    Comment: Glucose reference range applies only to samples taken after fasting for at least 8 hours.   MR BRAIN WO CONTRAST Result Date:  02/09/2023 CLINICAL DATA:  Left-sided weakness, left facial droop, aphasia, and dysarthria; history of stroke and seizures EXAM: MRI HEAD WITHOUT CONTRAST TECHNIQUE: Multiplanar, multiecho pulse sequences of the brain and surrounding structures were obtained without intravenous contrast. COMPARISON:  MRI head 03/13/2015, correlation is also made with 02/09/2023 CT head FINDINGS: Brain: No restricted diffusion to suggest acute or subacute infarct. No acute hemorrhage, mass, mass effect, or midline shift. No hydrocephalus or extra-axial collection. Pituitary and craniocervical junction within normal limits. Hemosiderin deposition in the bilateral basal ganglia, most likely petechial hemorrhage associated with remote infarcts. Additional remote lacunar infarcts in the thalami, pons,  and cerebellar hemispheres. Confluent T2 hyperintense signal in the periventricular white matter, likely the sequela of moderate to severe chronic small vessel ischemic disease. Advanced cerebral volume loss for age. Vascular: Normal arterial flow voids. Skull and upper cervical spine: Normal marrow signal. Sinuses/Orbits: Mucosal thickening in the ethmoid air cells. No acute finding in the orbits. Status post bilateral lens replacements. Other: The mastoid air cells are well aerated. IMPRESSION: No acute intracranial process. No evidence of acute or subacute infarct. Electronically Signed   By: Wiliam Ke M.D.   On: 02/09/2023 19:05   EEG adult Result Date: 02/09/2023 Charlsie Quest, MD     02/09/2023  3:42 PM Patient Name: Aimen Leeb MRN: [id number] Epilepsy Attending: Charlsie Quest Referring Physician/Provider: Lynnae January, NP Date: 02/09/2023 Duration: 26.50 mins Patient history: 72yo F with left sided weakness getting eeg to evaluate for seizure Level of alertness: Awake AEDs during EEG study: None Technical aspects: This EEG study was done with scalp electrodes positioned according to the 10-20 International  system of electrode placement. Electrical activity was reviewed with band pass filter of 1-70Hz , sensitivity of 7 uV/mm, display speed of 71mm/sec with a 60Hz  notched filter applied as appropriate. EEG data were recorded continuously and digitally stored.  Video monitoring was available and reviewed as appropriate. Description: The posterior dominant rhythm consists of 7.5 Hz activity of moderate voltage (25-35 uV) seen predominantly in posterior head regions, symmetric and reactive to eye opening and eye closing. EEG showed intermittent generalized 3 to 6 Hz theta-delta slowing. Hyperventilation and photic stimulation were not performed.   ABNORMALITY - Intermittent slow, generalized IMPRESSION: This study is suggestive of mild diffuse encephalopathy. No seizures or epileptiform discharges were seen throughout the recording. Priyanka Annabelle Harman   CT ANGIO HEAD NECK W WO CM W PERF (CODE STROKE) Result Date: 02/09/2023 CLINICAL DATA:  Neuro deficit, acute, stroke suspected. Left-sided weakness, aphasia and slurred speech. EXAM: CT ANGIOGRAPHY HEAD AND NECK CT PERFUSION BRAIN TECHNIQUE: Multidetector CT imaging of the head and neck was performed using the standard protocol during bolus administration of intravenous contrast. Multiplanar CT image reconstructions and MIPs were obtained to evaluate the vascular anatomy. Carotid stenosis measurements (when applicable) are obtained utilizing NASCET criteria, using the distal internal carotid diameter as the denominator. Multiphase CT imaging of the brain was performed following IV bolus contrast injection. Subsequent parametric perfusion maps were calculated using RAPID software. RADIATION DOSE REDUCTION: This exam was performed according to the departmental dose-optimization program which includes automated exposure control, adjustment of the mA and/or kV according to patient size and/or use of iterative reconstruction technique. CONTRAST:  OMNIPAQUE IOHEXOL 350  MG/ML SOLN COMPARISON:  Head CT 02/09/2023.  CTA head/neck 03/15/2017. FINDINGS: CTA NECK FINDINGS Aortic arch: Three-vessel arch configuration. Arch vessel origins are patent. Right carotid system: No evidence of dissection, stenosis (50% or greater) or occlusion. Extensive calcified plaque along the right carotid bulb and proximal right cervical ICA. Left carotid system: No evidence of dissection, stenosis (50% or greater) or occlusion. Moderate mixed plaque along the left carotid bulb and proximal left cervical ICA. Vertebral arteries: Severe stenosis of the non dominant right vertebral artery origin. Mixed plaque along the proximal left vertebral artery V1 segment without hemodynamically significant stenosis. Calcified plaque along the left V4 segment results in severe stenosis. The right vertebral artery functionally terminates in PICA with severe stenosis of the proximal right V4 segment. Skeleton: Mild cervical spondylosis without high-grade spinal canal stenosis. Other neck: Unremarkable. Upper chest:  Unremarkable. Review of the MIP images confirms the above findings CTA HEAD FINDINGS Anterior circulation: Unchanged extensive atherosclerotic calcifications of the bilateral carotid siphons with at least moderate stenosis of the bilateral cavernous segments. Unchanged multifocal stenosis of the right MCA vessels, notable for mild proximal M1 stenosis, severe stenosis of the inferior M2 division and beaded appearance of more distal intracranial branches of the right MCA. Unchanged multifocal stenosis of the left MCA vessels, notable for severe stenosis of the proximal inferior M2 division and mid superior M2 division. Mild stenosis of the right ACA, A1 segment. Posterior circulation: Diminutive basilar artery. The SCAs, AICAs and PICAs are patent proximally. Unchanged multifocal stenosis of the left PCA, including severe stenosis at the left P1-P2 junction and severe stenosis of the proximal left P2 segment.  Multifocal mild stenosis of the right PCA P2 and P3 segments. Asymmetric paucity of left PCA branches. Venous sinuses: As permitted by contrast timing, patent. Anatomic variants: None. Review of the MIP images confirms the above findings CT Brain Perfusion Findings: CBF (<30%) Volume: 0mL Perfusion (Tmax>6.0s) volume: 3mL Mismatch Volume: 3mL Ischemia location:Left PCA territory. IMPRESSION: 1. No large vessel occlusion. 2. Unchanged extensive intracranial atherosclerotic disease, including severe stenosis of the bilateral MCAs and left PCA. 3. Unchanged severe stenosis of the non dominant right vertebral artery origin and proximal right V4 segment. 4. No hemodynamically significant stenosis of the cervical carotid arteries. 5. 3 mL of ischemia in the left PCA territory by automated post processing, corresponding to old infarct. No core infarct. Code stroke imaging results were communicated on 02/09/2023 at 1:37 pm to provider Dr. Pearlean Brownie via secure text paging. Electronically Signed   By: Orvan Falconer M.D.   On: 02/09/2023 13:55   CT HEAD CODE STROKE WO CONTRAST Result Date: 02/09/2023 CLINICAL DATA:  Code stroke. Neuro deficit, acute, stroke suspected. Left-sided weakness, aphasia and slurred speech. EXAM: CT HEAD WITHOUT CONTRAST TECHNIQUE: Contiguous axial images were obtained from the base of the skull through the vertex without intravenous contrast. RADIATION DOSE REDUCTION: This exam was performed according to the departmental dose-optimization program which includes automated exposure control, adjustment of the mA and/or kV according to patient size and/or use of iterative reconstruction technique. COMPARISON:  Head CT 07/26/2022. FINDINGS: Brain: No acute hemorrhage. Unchanged severe chronic small-vessel disease with numerous old perforator infarcts in the deep cerebral white matter, bilateral basal ganglia, bilateral thalami, right midbrain and right cerebellar hemisphere. Old cortical infarct in the  medial aspect of the left occipital lobe. No new loss of cortical gray-white differentiation. No hydrocephalus or extra-axial collection. No mass effect or midline shift. Vascular: No hyperdense vessel or unexpected calcification. Skull: No calvarial fracture or suspicious bone lesion. Skull base is unremarkable. Sinuses/Orbits: No acute finding. Other: None. ASPECTS [facility] Stroke Program Early CT Score) - Ganglionic level infarction (caudate, lentiform nuclei, internal capsule, insula, M1-M3 cortex): 7 - Supraganglionic infarction (M4-M6 cortex): 3 Total score (0-10 with 10 being normal): 10 IMPRESSION: 1. No acute intracranial hemorrhage or evidence of acute large vessel territory infarct. ASPECT score is 10. 2. Unchanged severe chronic small-vessel disease with numerous old perforator infarcts as described above. Code stroke imaging results were communicated on 02/09/2023 at 1:37 pm to provider Dr. Pearlean Brownie via secure text paging. Electronically Signed   By: Orvan Falconer M.D.   On: 02/09/2023 13:37    Pending Labs Unresulted Labs (From admission, onward)     Start     Ordered   02/10/23 0500  Basic metabolic panel  Tomorrow morning,   R        02/09/23 1557   02/10/23 0500  CBC  Tomorrow morning,   R        02/09/23 1557   02/09/23 1552  Urinalysis, Routine w reflex microscopic -Urine, Clean Catch  Once,   R       Question:  Specimen Source  Answer:  Urine, Clean Catch   02/09/23 1557            Vitals/Pain Today's Vitals   02/09/23 2215 02/10/23 0015 02/10/23 0030 02/10/23 0201  BP: 134/71  (!) 145/73   Pulse: 75 77 75 72  Resp: 16 18 15 17   Temp:   98.6 F (37 C)   TempSrc:   Oral   SpO2: 100% 100% 100% 100%  Weight:      PainSc:        Isolation Precautions No active isolations  Medications Medications  sodium chloride flush (NS) 0.9 % injection 3 mL (3 mLs Intravenous Not Given 02/09/23 1347)  acetaminophen (TYLENOL) tablet 1,000 mg (1,000 mg Oral Given 02/09/23  1803)  aspirin EC tablet 81 mg (has no administration in time range)  hydroxychloroquine (PLAQUENIL) tablet 200 mg (has no administration in time range)  atorvastatin (LIPITOR) tablet 5 mg (5 mg Oral Given 02/09/23 2144)  valbenazine (INGREZZA) capsule 40 mg (has no administration in time range)  sertraline (ZOLOFT) tablet 75 mg (has no administration in time range)  polyethylene glycol (MIRALAX / GLYCOLAX) packet 17 g (has no administration in time range)  gabapentin (NEURONTIN) capsule 200 mg (200 mg Oral Given 02/09/23 2144)  enoxaparin (LOVENOX) injection 40 mg (40 mg Subcutaneous Given 02/09/23 2147)  insulin glargine-yfgn (SEMGLEE) injection 10 Units (10 Units Subcutaneous Given 02/09/23 2249)  insulin aspart (novoLOG) injection 0-9 Units (3 Units Subcutaneous Given 02/09/23 1803)  amLODipine (NORVASC) tablet 10 mg (has no administration in time range)  lisinopril (ZESTRIL) tablet 10 mg (has no administration in time range)  iohexol (OMNIPAQUE) 350 MG/ML injection 100 mL (100 mLs Intravenous Contrast Given 02/09/23 1335)  LORazepam (ATIVAN) injection 1 mg (1 mg Intravenous Given 02/09/23 1611)    Mobility walks with device     Focused Assessments     R Recommendations: See Admitting Provider Note  Report given to:   Additional Notes:

## 2023-02-11 DIAGNOSIS — R531 Weakness: Secondary | ICD-10-CM | POA: Diagnosis not present

## 2023-02-11 DIAGNOSIS — R2990 Unspecified symptoms and signs involving the nervous system: Principal | ICD-10-CM

## 2023-02-11 DIAGNOSIS — N179 Acute kidney failure, unspecified: Secondary | ICD-10-CM

## 2023-02-11 LAB — BASIC METABOLIC PANEL
Anion gap: 7 (ref 5–15)
BUN: 24 mg/dL — ABNORMAL HIGH (ref 8–23)
CO2: 23 mmol/L (ref 22–32)
Calcium: 9 mg/dL (ref 8.9–10.3)
Chloride: 107 mmol/L (ref 98–111)
Creatinine, Ser: 1.16 mg/dL — ABNORMAL HIGH (ref 0.44–1.00)
GFR, Estimated: 50 mL/min — ABNORMAL LOW (ref >60)
Glucose, Bld: 218 mg/dL — ABNORMAL HIGH (ref 70–99)
Potassium: 4.1 mmol/L (ref 3.5–5.1)
Sodium: 137 mmol/L (ref 135–145)

## 2023-02-11 LAB — CBC
HCT: 34.7 % — ABNORMAL LOW (ref 36.0–46.0)
Hemoglobin: 11.9 g/dL — ABNORMAL LOW (ref 12.0–15.0)
MCH: 30.4 pg (ref 26.0–34.0)
MCHC: 34.3 g/dL (ref 30.0–36.0)
MCV: 88.7 fL (ref 80.0–100.0)
Platelets: 117 10*3/uL — ABNORMAL LOW (ref 150–400)
RBC: 3.91 MIL/uL (ref 3.87–5.11)
RDW: 13.6 % (ref 11.5–15.5)
WBC: 5.8 10*3/uL (ref 4.0–10.5)
nRBC: 0 % (ref 0.0–0.2)

## 2023-02-11 LAB — GLUCOSE, CAPILLARY
Glucose-Capillary: 220 mg/dL — ABNORMAL HIGH (ref 70–99)
Glucose-Capillary: 206 mg/dL — ABNORMAL HIGH (ref 70–99)

## 2023-02-11 LAB — RPR: RPR Ser Ql: NONREACTIVE

## 2023-02-11 LAB — MAGNESIUM: Magnesium: 2.3 mg/dL (ref 1.7–2.4)

## 2023-02-11 MED ORDER — LEVETIRACETAM 500 MG PO TABS: 500.0000 mg | ORAL_TABLET | Freq: Two times a day (BID) | ORAL | Status: DC

## 2023-02-11 MED ORDER — INSULIN GLARGINE 100 UNIT/ML ~~LOC~~ SOLN: 20.0000 [IU] | Freq: Every day | SUBCUTANEOUS | Status: AC

## 2023-02-11 MED ORDER — LEVETIRACETAM 500 MG PO TABS
500.0000 mg | ORAL_TABLET | Freq: Two times a day (BID) | ORAL | Status: DC
  Administered 2023-02-11: 500 mg via ORAL
  Filled 2023-02-11: qty 1

## 2023-02-11 NOTE — Discharge Summary (Addendum)
Family Medicine Teaching [facility] Discharge Summary  Patient name: Olivia Burnett Medical record number: [id number] Date of birth: [birth year] Age: 72 y.o. Gender: female Date of Admission: 02/09/2023  Date of Discharge: 02/11/2023 Admitting Physician: Gerrit Heck, DO  Primary Care Provider: Pcp, No Consultants: Neurology  Indication for Hospitalization: L sided Hemiparesis  Discharge Diagnoses/Problem List:  Principal Problem for Admission: L sided hemiparesis Other Problems addressed during stay:  Principal Problem:   Acute left-sided weakness Active Problems:   AKI (acute kidney injury) (HCC)   Elevated serum creatinine   Stroke-like episode   Brief Hospital Course:  Olivia Burnett is a 72 y.o. 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  L sided Hemiparesis  Patient arrived in the ED under code stroke. CT head and neck as well as CTA showed no acute findings, but did show numerous old infarcts. MRI supported this finding. EEG was negative for epileptiform waves or active seizures, and suggested global encephalopathy. EKG, ECHO and risk stratification labs were obtained, and were unremarkable. We also performed the work up for reversible causes of AMS which all returned negative. Neurology felt the cause to likely be from a seizure, and started her on Keppra 500 mg BID. At time of discharge her hemiparesis had completely resolved.  Elevated serum creatinine Present on admission labs. Patient was near baseline after 24 hours, UA w/reflex culture was ordered due to recent history of UTI but was deferred to outpatient for further work up. Creatinine was stable at time of discharge.   Other chronic conditions were medically managed with home medications and formulary alternatives as necessary.  PCP Follow-up  Recommendations: Continue keppra 500 mg bid Monitor CBGs outpatient Follow up UTI sx, consider culture.  Complete PT/OT with patient   Disposition: ALF  Discharge Condition: Stable  Discharge Exam:  Vitals:   02/11/23 0745 02/11/23 1142  BP: (!) 153/74 (!) 158/69  Pulse: (!) 57 66  Resp:  20  Temp:  98.3 F (36.8 C)  SpO2: 99% 100%   General: Well-appearing elderly female, no distress Cardiovascular: RRR, no M/R/G Respiratory: CTAB, no increased work of breathing Extremities: No evidence of peripheral edema Neuro: Facial asymmetry has resolved.  Strength in the bilateral upper and lower extremities is equal.  Unable to fully assess dysarthria as patient kept falling asleep.  Significant Procedures: None  Significant Labs and Imaging:  Recent Labs  Lab 02/10/23 0446 02/10/23 1532 02/11/23 0703  WBC 6.6 7.5 5.8  HGB 11.3* 11.2* 11.9*  HCT 33.0* 32.3* 34.7*  PLT 116* 130* 117*   Recent Labs  Lab 02/10/23 0446 02/11/23 0703  NA 134* 137  K 4.6 4.1  CL 103 107  CO2 21* 23  GLUCOSE 257* 218*  BUN 29* 24*  CREATININE 1.38* 1.16*  CALCIUM 8.7* 9.0  MG  --  2.3    CT head and neck without contrast: No acute intracranial process multiple chronic small vessel ischemic changes, no cervical spine fracture, moderate to severe DDD  MRI brain: No acute intracranial process or evidence of acute or subacute infarct  Echocardiogram: LVEF equals 65 to 70% grade 2 diastolic dysfunction  Results/Tests Pending at Time of Discharge: UDS  Discharge Medications:  Allergies as of 02/11/2023       Reactions   Ciprofloxacin    Per MAR   Gemifloxacin  Per MAR   Infliximab Other (See Comments)   Unknown reaction unknown   Levaquin [levofloxacin]    Per MAR   Meperidine Other (See Comments)   Unknown reaction unknown   Mercury    hives   Moxifloxacin    Per MAR   Ofloxacin    Per MAR   Penicillins    rash   Phenobarbital Other (See Comments)   Unknown  reaction unknown   Pregabalin Other (See Comments)   Dry mouth, confusion, incontinence   Prochlorperazine Other (See Comments)   Unknown reaction unknown        Medication List     STOP taking these medications    Glucagon 1 MG/0.2ML Soln   Ingrezza 40 MG capsule Generic drug: valbenazine   morphine 10 MG 24 hr capsule Commonly known as: KADIAN   vancomycin 125 MG capsule Commonly known as: VANCOCIN       TAKE these medications    acetaminophen 500 MG tablet Commonly known as: TYLENOL Take 1,000 mg by mouth in the morning, at noon, and at bedtime.   amLODipine 10 MG tablet Commonly known as: NORVASC Take 10 mg by mouth daily.   ARTIFICIAL TEARS OP Place 1 drop into both eyes 4 (four) times daily.   aspirin EC 81 MG tablet Take 81 mg by mouth daily. Swallow whole.   atorvastatin 10 MG tablet Commonly known as: LIPITOR Take 5 mg by mouth at bedtime.   Calmoseptine 0.44-20.6 % Oint Generic drug: Menthol-Zinc Oxide Apply 1 Application topically in the morning and at bedtime.   Cranberry 450 MG Tabs Take 450 mg by mouth daily.   gabapentin 100 MG capsule Commonly known as: NEURONTIN Take 200 mg by mouth 2 (two) times daily.   hydroxychloroquine 200 MG tablet Commonly known as: PLAQUENIL Take 200 mg by mouth daily.   insulin glargine 100 UNIT/ML injection Commonly known as: LANTUS Inject 0.2 mLs (20 Units total) into the skin daily. What changed: how much to take   isosorbide mononitrate 30 MG 24 hr tablet Commonly known as: IMDUR Take 30 mg by mouth daily.   LACTOBACILLUS PO Take 1 capsule by mouth daily.   levETIRAcetam 500 MG tablet Commonly known as: KEPPRA Take 1 tablet (500 mg total) by mouth 2 (two) times daily.   Linzess 145 MCG Caps capsule Generic drug: linaclotide Take 145 mcg by mouth daily.   lisinopril 10 MG tablet Commonly known as: ZESTRIL Take 10 mg by mouth daily.   melatonin 3 MG Tabs tablet Take 9 mg by mouth at  bedtime.   metoprolol tartrate 25 MG tablet Commonly known as: LOPRESSOR Take 12.5 mg by mouth 2 (two) times daily.   nystatin cream Commonly known as: MYCOSTATIN Apply 1 Application topically 2 (two) times daily as needed (rash).   polyethylene glycol 17 g packet Commonly known as: MIRALAX / GLYCOLAX Take 17 g by mouth daily.   selenium sulfide 1 % Lotn Commonly known as: SELSUN Apply 1 Application topically daily. On Wednesday and Friday   sennosides-docusate sodium 8.6-50 MG tablet Commonly known as: SENOKOT-S Take 2 tablets by mouth in the morning and at bedtime.   sertraline 50 MG tablet Commonly known as: ZOLOFT Take 75 mg by mouth daily.   simethicone 125 MG chewable tablet Commonly known as: MYLICON Chew 125 mg by mouth at bedtime as needed for flatulence.   triamcinolone 0.1 % paste Commonly known as: KENALOG Use as directed 1 Application in the mouth or throat at  bedtime as needed (cold sores).        Discharge Instructions: Please refer to Patient Instructions section of EMR for full details.  Patient was counseled important signs and symptoms that should prompt return to medical care, changes in medications, dietary instructions, activity restrictions, and follow up appointments.   Follow-Up Appointments:  Contact information for after-discharge care     Destination     [facility] .   Service: Skilled Nursing Contact information: [address] [facility] [address] [phone]                     Gerrit Heck, DO 02/11/2023, 12:56 PM PGY-1, [facility]   Upper Level Addendum: I have seen and evaluated this patient along with Dr. Hyacinth Meeker and reviewed the above note, making necessary revisions as appropriate. I agree with the medical decision making and physical exam as noted above. Bess Kinds, MD PGY-3 [facility]

## 2023-02-11 NOTE — [place] FL2 (Signed)
[place] MEDICAID FL2 LEVEL OF CARE FORM     IDENTIFICATION  Patient Name: Olivia Burnett Birthdate: 10-Jan-1951 Sex: female Admission Date (Current Location): 02/09/2023  [facility] and [place] Number:  Producer, television/film/video and Address:  [facility] [place], [address], [place], [place] [address]      Provider Number: [id number]  Attending Physician Name and Address:  Westley Chandler, MD  Relative Name and Phone Number:       Current Level of Care: Hospital Recommended Level of Care: Skilled Nursing Facility Prior Approval Number:    Date Approved/Denied:   PASRR Number:    Discharge Plan: SNF    Current Diagnoses: Patient Active Problem List   Diagnosis Date Noted   Stroke-like episode 02/11/2023   Acute left-sided weakness 02/09/2023   AKI (acute kidney injury) (HCC) 02/09/2023   Elevated serum creatinine 02/09/2023    Orientation RESPIRATION BLADDER Height & Weight     Self, Situation, Place  Normal Incontinent Weight: 164 lb 14.5 oz (74.8 kg) Height:  5\' 11"  (180.3 cm)  BEHAVIORAL SYMPTOMS/MOOD NEUROLOGICAL BOWEL NUTRITION STATUS      Incontinent Diet  AMBULATORY STATUS COMMUNICATION OF NEEDS Skin   Extensive Assist Verbally Normal                       Personal Care Assistance Level of Assistance  Bathing, Feeding, Dressing Bathing Assistance: Maximum assistance Feeding assistance: Limited assistance Dressing Assistance: Maximum assistance     Functional Limitations Info  Sight Sight Info: Impaired        SPECIAL CARE FACTORS FREQUENCY                       Contractures Contractures Info: Not present    Additional Factors Info  Code Status, Allergies, Insulin Sliding Scale Code Status Info: DNR Allergies Info: Ciprofloxacin, Gemifloxacin, Infliximab, Levaquin (Levofloxacin), Meperidine, Mercury, Moxifloxacin, Ofloxacin, Penicillins, Phenobarbital, Pregabalin, Prochlorperazine   Insulin Sliding Scale Info:  see DC summary       Current Medications (02/11/2023):  This is the current hospital active medication list Current Facility-Administered Medications  Medication Dose Route Frequency Provider Last Rate Last Admin   acetaminophen (TYLENOL) tablet 1,000 mg  1,000 mg Oral Q8H PRN Gerrit Heck, DO   1,000 mg at 02/11/23 0049   amLODipine (NORVASC) tablet 10 mg  10 mg Oral Daily Shitarev, Dimitry, MD   10 mg at 02/11/23 5621   aspirin EC tablet 81 mg  81 mg Oral Daily Gerrit Heck, DO   81 mg at 02/11/23 3086   atorvastatin (LIPITOR) tablet 5 mg  5 mg Oral QHS Gerrit Heck, DO   5 mg at 02/10/23 2226   enoxaparin (LOVENOX) injection 40 mg  40 mg Subcutaneous Q24H Gerrit Heck, DO   40 mg at 02/10/23 2225   gabapentin (NEURONTIN) capsule 200 mg  200 mg Oral BID Gerrit Heck, DO   200 mg at 02/11/23 5784   hydroxychloroquine (PLAQUENIL) tablet 200 mg  200 mg Oral Daily Gerrit Heck, DO   200 mg at 02/11/23 0954   insulin aspart (novoLOG) injection 0-9 Units  0-9 Units Subcutaneous TID WC Gerrit Heck, DO   3 Units at 02/11/23 1149   insulin glargine-yfgn (SEMGLEE) injection 20 Units  20 Units Subcutaneous QHS Baloch, Mahnoor, MD   20 Units at 02/10/23 2227   isosorbide mononitrate (IMDUR) 24 hr tablet 30 mg  30 mg Oral Daily Baloch, Mahnoor, MD  30 mg at 02/11/23 0953   levETIRAcetam (KEPPRA) tablet 500 mg  500 mg Oral BID Elberta Fortis, MD   500 mg at 02/11/23 0951   lisinopril (ZESTRIL) tablet 10 mg  10 mg Oral Daily Shitarev, Dimitry, MD   10 mg at 02/11/23 0951   melatonin tablet 5 mg  5 mg Oral QHS Shitarev, Dimitry, MD       metoprolol tartrate (LOPRESSOR) tablet 12.5 mg  12.5 mg Oral BID Baloch, Mahnoor, MD   12.5 mg at 02/10/23 2226   polyethylene glycol (MIRALAX / GLYCOLAX) packet 17 g  17 g Oral Daily Gerrit Heck, DO   17 g at 02/11/23 0950   sertraline (ZOLOFT) tablet 75 mg  75 mg Oral Daily Gerrit Heck, DO   75 mg at 02/11/23 0951   sodium  chloride flush (NS) 0.9 % injection 3 mL  3 mL Intravenous Once Curatolo, Adam, DO       valbenazine (INGREZZA) capsule 40 mg  40 mg Oral Daily Baloch, Mahnoor, MD   40 mg at 02/11/23 0865     Discharge Medications: Please see discharge summary for a list of discharge medications.  Relevant Imaging Results:  Relevant Lab Results:   Additional Information SS#: [id number]  Baldemar Lenis, LCSW

## 2023-02-11 NOTE — Assessment & Plan Note (Addendum)
Creatinine is now better than baseline, UA still not collected. -UA, reflex urine culture - AM BMP - Encourage p.o. fluids

## 2023-02-11 NOTE — Assessment & Plan Note (Addendum)
Strength returned to symmetric today.  Facial droop has resolved as has dysarthria.  Neuro restarted her Keppra yesterday. -Neurology is following, appreciate their recommendations -Start Keppra 500 mg twice daily -Echo EF 60 to 75%, grade 2 diastolic dysfunction otherwise normal. -vitals per floor -telemetry -PT/OT/SLP eval and treat -AM CBC, BMP, Mag -fall precautions -delirium precautions -UDS still pending

## 2023-02-11 NOTE — Progress Notes (Signed)
     Daily Progress Note Intern Pager: [phone]  Patient name: Olivia Burnett Medical record number: [id number] Date of birth: [birth year] Age: 72 y.o. Gender: female  Primary Care Provider: Pcp, No Consultants: Neurology Code Status: DNR  Pt Overview and Major Events to Date:  12/17: Admitted to FMTS  Assessment and Plan:  Olivia Burnett is a 72 year old lady with a past medical history of hypertension, diabetes, HLD, seizures and prior CVAs presenting with acute left hemiparesis.  Thus far stroke workup has been negative.  Given that she was taken off of her seizure prophylactic and indeterminate amount of time ago and the pattern fits with her previous seizures it is favored that this could be seizure with postictal hemiparesis.  Toxic metabolites workup thus far has been negative. Assessment & Plan Acute left-sided weakness Strength returned to symmetric today.  Facial droop has resolved as has dysarthria.  Neuro restarted her Keppra yesterday. -Neurology is following, appreciate their recommendations -Start Keppra 500 mg twice daily -Echo EF 60 to 75%, grade 2 diastolic dysfunction otherwise normal. -vitals per floor -telemetry -PT/OT/SLP eval and treat -AM CBC, BMP, Mag -fall precautions -delirium precautions -UDS still pending Elevated serum creatinine Creatinine is now better than baseline, UA still not collected. -UA, reflex urine culture - AM BMP - Encourage p.o. fluids  Chronic and Stable Problems:  Diabetes: Increased to 20 units Semglee, sensitive sliding scale in insulin Seizures: Started on Keppra 500 mg twice daily, continue Ingrezza  FEN/GI: Carb modified PPx: Lovenox Dispo:Pending PT recommendations    Subjective:  Patient sleeping deeply on exam this morning.  Did wake up and follow simple commands but was not interactive.  Did report that she is no concerns this morning  Objective: Temp:  [97.8 F (36.6 C)-98.7 F (37.1 C)] 98.1 F (36.7 C)  (12/19 0420) Pulse Rate:  [61-89] 63 (12/19 0420) Resp:  [16-20] 16 (12/19 0420) BP: (126-159)/(55-63) 138/61 (12/19 0420) SpO2:  [93 %-100 %] 100 % (12/19 0420) Weight:  [74.8 kg] 74.8 kg (12/18 1445) Physical Exam: General: Well-appearing elderly female no distress Cardiovascular: RRR, no M/R/G Respiratory: CTAB, no increased work of breathing Extremities: No evidence of peripheral edema Neuro: Facial asymmetry resolved.  Strength in the bilateral upper and lower extremities is equal.  Unable to assess dysarthria today.  Laboratory: Most recent CBC Lab Results  Component Value Date   WBC 7.5 02/10/2023   HGB 11.2 (L) 02/10/2023   HCT 32.3 (L) 02/10/2023   MCV 89.5 02/10/2023   PLT 130 (L) 02/10/2023   Most recent BMP    Latest Ref Rng & Units 02/10/2023    4:46 AM  BMP  Glucose 70 - 99 mg/dL 578   BUN 8 - 23 mg/dL 29   Creatinine 4.69 - 1.00 mg/dL 6.29   Sodium 528 - 413 mmol/L 134   Potassium 3.5 - 5.1 mmol/L 4.6   Chloride 98 - 111 mmol/L 103   CO2 22 - 32 mmol/L 21   Calcium 8.9 - 10.3 mg/dL 8.7     Gerrit Heck, DO 02/11/2023, 7:26 AM  PGY-1, [place] FPTS Intern pager: [phone], text pages welcome Secure chat group [facility] Teaching Service

## 2023-02-11 NOTE — TOC Transition Note (Signed)
Transition of Care [facility]) - Discharge Note   Patient Details  Name: Olivia Burnett MRN: [id number] Date of Birth: 11-06-1950  [facility] CM/SW Contact:  Baldemar Lenis, LCSW Phone Number: 02/11/2023, 1:02 PM   Clinical Narrative:   CSW updated by MD that patient stable to return to SNF today. CSW sent discharge information to [facility], confirmed room is ready. CSW updated daughter, Amy, she requested that therapies be added for the patient. CSW updated MD, summary updated, sent again to [facility] for them to complete PT/OT with the patient upon return. Transport arranged with [facility] for next available.  Nurse to call report to [phone], [facility] Room 708A    Final next level of care: Skilled Nursing Facility Barriers to Discharge: Barriers Resolved   Patient Goals and CMS Choice Patient states their goals for this hospitalization and ongoing recovery are:: patient unable to participate in goal setting, not fully oriented CMS Medicare.gov Compare Post Acute Care list provided to:: Patient Choice offered to / list presented to : Patient [place] ownership interest in [facility].provided to:: Patient    Discharge Placement              [facility] at[facility] Patient to be transferred to facility by: [facility] Name of family member notified: Amy Patient and family notified of of transfer: 02/11/23  Discharge Plan and Services Additional resources added to the After Visit Summary for       Post Acute Care Choice: Skilled Nursing Facility                               Social Drivers of Health (SDOH) Interventions SDOH Screenings   Food Insecurity: No Food Insecurity (02/09/2023)  Housing: Low Risk  (02/09/2023)  Transportation Needs: No Transportation Needs (02/09/2023)  Utilities: Not At Risk (02/09/2023)  Tobacco Use: Unknown (02/09/2023)     Readmission Risk Interventions     No data to display

## 2023-02-11 NOTE — Discharge Instructions (Addendum)
Dear Olivia Burnett,   Thank you for letting us participate in your care! In this section, you will find a brief hospital admission summary of why you were admitted to the hospital, what happened during your admission, your diagnosis/diagnoses, and recommended follow up.   You were seen for weakness that was most likely occurred after a seizure.  You were seen by neurologist and started on Keppra 500 mg twice per day to continue upon discharge.   POST-HOSPITAL & CARE INSTRUCTIONS We recommend following up with your PCP within 1 week from being discharged from the hospital. Please let PCP/Specialists know of any changes in medications that were made which you will be able to see in the medications section of this packet. Please also follow up with the neurologist for seizure management   Thank you for choosing [facility]! Take care and be well!  Family Medicine Teaching Service Inpatient Team   [facility]  [address] [place], [place] [address] [phone]

## 2023-05-14 ENCOUNTER — Inpatient Hospital Stay (HOSPITAL_COMMUNITY)
Admission: EM | Admit: 2023-05-14 | Discharge: 2023-05-24 | DRG: 193 | Disposition: A | Discharge status: Hospice | Source: Skilled Nursing Facility | Attending: Internal Medicine | Admitting: Internal Medicine

## 2023-05-14 ENCOUNTER — Other Ambulatory Visit: Payer: Self-pay

## 2023-05-14 ENCOUNTER — Emergency Department (HOSPITAL_COMMUNITY)

## 2023-05-14 DIAGNOSIS — R6520 Severe sepsis without septic shock: Secondary | ICD-10-CM | POA: Diagnosis not present

## 2023-05-14 DIAGNOSIS — J9811 Atelectasis: Secondary | ICD-10-CM | POA: Diagnosis not present

## 2023-05-14 DIAGNOSIS — Z515 Encounter for palliative care: Secondary | ICD-10-CM

## 2023-05-14 DIAGNOSIS — B961 Klebsiella pneumoniae [K. pneumoniae] as the cause of diseases classified elsewhere: Secondary | ICD-10-CM | POA: Diagnosis present

## 2023-05-14 DIAGNOSIS — Z881 Allergy status to other antibiotic agents status: Secondary | ICD-10-CM

## 2023-05-14 DIAGNOSIS — E8721 Acute metabolic acidosis: Secondary | ICD-10-CM | POA: Diagnosis not present

## 2023-05-14 DIAGNOSIS — N179 Acute kidney failure, unspecified: Secondary | ICD-10-CM | POA: Diagnosis not present

## 2023-05-14 DIAGNOSIS — Z8673 Personal history of transient ischemic attack (TIA), and cerebral infarction without residual deficits: Secondary | ICD-10-CM

## 2023-05-14 DIAGNOSIS — G40909 Epilepsy, unspecified, not intractable, without status epilepticus: Secondary | ICD-10-CM | POA: Diagnosis present

## 2023-05-14 DIAGNOSIS — I7121 Aneurysm of the ascending aorta, without rupture: Secondary | ICD-10-CM | POA: Diagnosis present

## 2023-05-14 DIAGNOSIS — I2510 Atherosclerotic heart disease of native coronary artery without angina pectoris: Secondary | ICD-10-CM | POA: Diagnosis present

## 2023-05-14 DIAGNOSIS — I21A1 Myocardial infarction type 2: Secondary | ICD-10-CM | POA: Diagnosis not present

## 2023-05-14 DIAGNOSIS — R569 Unspecified convulsions: Secondary | ICD-10-CM

## 2023-05-14 DIAGNOSIS — F0394 Unspecified dementia, unspecified severity, with anxiety: Secondary | ICD-10-CM | POA: Diagnosis present

## 2023-05-14 DIAGNOSIS — D696 Thrombocytopenia, unspecified: Secondary | ICD-10-CM | POA: Diagnosis present

## 2023-05-14 DIAGNOSIS — I10 Essential (primary) hypertension: Secondary | ICD-10-CM | POA: Diagnosis present

## 2023-05-14 DIAGNOSIS — N1831 Chronic kidney disease, stage 3a: Secondary | ICD-10-CM | POA: Diagnosis present

## 2023-05-14 DIAGNOSIS — I252 Old myocardial infarction: Secondary | ICD-10-CM

## 2023-05-14 DIAGNOSIS — I69351 Hemiplegia and hemiparesis following cerebral infarction affecting right dominant side: Secondary | ICD-10-CM

## 2023-05-14 DIAGNOSIS — L4050 Arthropathic psoriasis, unspecified: Secondary | ICD-10-CM | POA: Diagnosis present

## 2023-05-14 DIAGNOSIS — F0393 Unspecified dementia, unspecified severity, with mood disturbance: Secondary | ICD-10-CM | POA: Diagnosis present

## 2023-05-14 DIAGNOSIS — J9601 Acute respiratory failure with hypoxia: Secondary | ICD-10-CM | POA: Diagnosis not present

## 2023-05-14 DIAGNOSIS — Z794 Long term (current) use of insulin: Secondary | ICD-10-CM

## 2023-05-14 DIAGNOSIS — Z7401 Bed confinement status: Secondary | ICD-10-CM

## 2023-05-14 DIAGNOSIS — J189 Pneumonia, unspecified organism: Principal | ICD-10-CM | POA: Diagnosis present

## 2023-05-14 DIAGNOSIS — G9341 Metabolic encephalopathy: Secondary | ICD-10-CM | POA: Diagnosis not present

## 2023-05-14 DIAGNOSIS — R739 Hyperglycemia, unspecified: Principal | ICD-10-CM

## 2023-05-14 DIAGNOSIS — I4819 Other persistent atrial fibrillation: Secondary | ICD-10-CM | POA: Diagnosis not present

## 2023-05-14 DIAGNOSIS — Z87440 Personal history of urinary (tract) infections: Secondary | ICD-10-CM

## 2023-05-14 DIAGNOSIS — Z955 Presence of coronary angioplasty implant and graft: Secondary | ICD-10-CM

## 2023-05-14 DIAGNOSIS — Z993 Dependence on wheelchair: Secondary | ICD-10-CM

## 2023-05-14 DIAGNOSIS — A419 Sepsis, unspecified organism: Secondary | ICD-10-CM | POA: Diagnosis not present

## 2023-05-14 DIAGNOSIS — Z7982 Long term (current) use of aspirin: Secondary | ICD-10-CM

## 2023-05-14 DIAGNOSIS — N390 Urinary tract infection, site not specified: Secondary | ICD-10-CM | POA: Diagnosis not present

## 2023-05-14 DIAGNOSIS — Z7901 Long term (current) use of anticoagulants: Secondary | ICD-10-CM

## 2023-05-14 DIAGNOSIS — Z1612 Extended spectrum beta lactamase (ESBL) resistance: Secondary | ICD-10-CM | POA: Diagnosis present

## 2023-05-14 DIAGNOSIS — Z880 Allergy status to penicillin: Secondary | ICD-10-CM

## 2023-05-14 DIAGNOSIS — Z7902 Long term (current) use of antithrombotics/antiplatelets: Secondary | ICD-10-CM

## 2023-05-14 DIAGNOSIS — E1165 Type 2 diabetes mellitus with hyperglycemia: Secondary | ICD-10-CM | POA: Diagnosis present

## 2023-05-14 DIAGNOSIS — Z79899 Other long term (current) drug therapy: Secondary | ICD-10-CM

## 2023-05-14 DIAGNOSIS — E871 Hypo-osmolality and hyponatremia: Secondary | ICD-10-CM | POA: Diagnosis not present

## 2023-05-14 DIAGNOSIS — E785 Hyperlipidemia, unspecified: Secondary | ICD-10-CM | POA: Diagnosis present

## 2023-05-14 DIAGNOSIS — E1122 Type 2 diabetes mellitus with diabetic chronic kidney disease: Secondary | ICD-10-CM | POA: Diagnosis present

## 2023-05-14 DIAGNOSIS — Z888 Allergy status to other drugs, medicaments and biological substances status: Secondary | ICD-10-CM

## 2023-05-14 DIAGNOSIS — Z1152 Encounter for screening for COVID-19: Secondary | ICD-10-CM

## 2023-05-14 DIAGNOSIS — Z66 Do not resuscitate: Secondary | ICD-10-CM | POA: Diagnosis present

## 2023-05-14 DIAGNOSIS — D693 Immune thrombocytopenic purpura: Secondary | ICD-10-CM | POA: Diagnosis present

## 2023-05-14 DIAGNOSIS — I129 Hypertensive chronic kidney disease with stage 1 through stage 4 chronic kidney disease, or unspecified chronic kidney disease: Secondary | ICD-10-CM | POA: Diagnosis present

## 2023-05-14 LAB — CBC WITH DIFFERENTIAL/PLATELET
Abs Immature Granulocytes: 0.04 10*3/uL (ref 0.00–0.07)
Basophils Absolute: 0 10*3/uL (ref 0.0–0.1)
Basophils Relative: 0 %
Eosinophils Absolute: 0.1 10*3/uL (ref 0.0–0.5)
Eosinophils Relative: 1 %
HCT: 32.1 % — ABNORMAL LOW (ref 36.0–46.0)
Hemoglobin: 11.3 g/dL — ABNORMAL LOW (ref 12.0–15.0)
Immature Granulocytes: 0 %
Lymphocytes Relative: 14 %
Lymphs Abs: 1.5 10*3/uL (ref 0.7–4.0)
MCH: 31.2 pg (ref 26.0–34.0)
MCHC: 35.2 g/dL (ref 30.0–36.0)
MCV: 88.7 fL (ref 80.0–100.0)
Monocytes Absolute: 0.7 10*3/uL (ref 0.1–1.0)
Monocytes Relative: 6 %
Neutro Abs: 8.5 10*3/uL — ABNORMAL HIGH (ref 1.7–7.7)
Neutrophils Relative %: 79 %
Platelets: 117 10*3/uL — ABNORMAL LOW (ref 150–400)
RBC: 3.62 MIL/uL — ABNORMAL LOW (ref 3.87–5.11)
RDW: 13.7 % (ref 11.5–15.5)
WBC: 10.9 10*3/uL — ABNORMAL HIGH (ref 4.0–10.5)
nRBC: 0 % (ref 0.0–0.2)

## 2023-05-14 LAB — PROTIME-INR
INR: 1.1 (ref 0.8–1.2)
Prothrombin Time: 14.5 s (ref 11.4–15.2)

## 2023-05-14 LAB — COMPREHENSIVE METABOLIC PANEL
ALT: 12 U/L (ref 0–44)
AST: 21 U/L (ref 15–41)
Albumin: 4 g/dL (ref 3.5–5.0)
Alkaline Phosphatase: 68 U/L (ref 38–126)
Anion gap: 8 (ref 5–15)
BUN: 24 mg/dL — ABNORMAL HIGH (ref 8–23)
CO2: 19 mmol/L — ABNORMAL LOW (ref 22–32)
Calcium: 8.6 mg/dL — ABNORMAL LOW (ref 8.9–10.3)
Chloride: 103 mmol/L (ref 98–111)
Creatinine, Ser: 1.16 mg/dL — ABNORMAL HIGH (ref 0.44–1.00)
GFR, Estimated: 50 mL/min — ABNORMAL LOW (ref >60)
Glucose, Bld: 353 mg/dL — ABNORMAL HIGH (ref 70–99)
Potassium: 3.9 mmol/L (ref 3.5–5.1)
Sodium: 130 mmol/L — ABNORMAL LOW (ref 135–145)
Total Bilirubin: 0.7 mg/dL (ref 0.0–1.2)
Total Protein: 7.1 g/dL (ref 6.5–8.1)

## 2023-05-14 LAB — BLOOD GAS, VENOUS
Acid-base deficit: 6.3 mmol/L — ABNORMAL HIGH (ref 0.0–2.0)
Bicarbonate: 19 mmol/L — ABNORMAL LOW (ref 20.0–28.0)
O2 Saturation: 80.2 %
Patient temperature: 37
pCO2, Ven: 36 mmHg — ABNORMAL LOW (ref 44–60)
pH, Ven: 7.33 (ref 7.25–7.43)
pO2, Ven: 45 mmHg (ref 32–45)

## 2023-05-14 LAB — I-STAT CG4 LACTIC ACID, ED: Lactic Acid, Venous: 1.2 mmol/L (ref 0.5–1.9)

## 2023-05-14 LAB — APTT: aPTT: 31 s (ref 24–36)

## 2023-05-14 LAB — BETA-HYDROXYBUTYRIC ACID: Beta-Hydroxybutyric Acid: 0.07 mmol/L (ref 0.05–0.27)

## 2023-05-14 LAB — CBG MONITORING, ED
Glucose-Capillary: 351 mg/dL — ABNORMAL HIGH (ref 70–99)
Glucose-Capillary: 302 mg/dL — ABNORMAL HIGH (ref 70–99)

## 2023-05-14 LAB — RESP PANEL BY RT-PCR (RSV, FLU A&B, COVID)  RVPGX2
Influenza A by PCR: NEGATIVE
Influenza B by PCR: NEGATIVE
Resp Syncytial Virus by PCR: NEGATIVE
SARS Coronavirus 2 by RT PCR: NEGATIVE

## 2023-05-14 LAB — TROPONIN I (HIGH SENSITIVITY): Troponin I (High Sensitivity): 9 ng/L (ref <18)

## 2023-05-14 MED ORDER — ONDANSETRON HCL 4 MG/2ML IJ SOLN
4.0000 mg | Freq: Once | INTRAMUSCULAR | Status: AC
  Administered 2023-05-14: 4 mg via INTRAVENOUS
  Filled 2023-05-14: qty 2

## 2023-05-14 MED ORDER — IOHEXOL 350 MG/ML SOLN
75.0000 mL | Freq: Once | INTRAVENOUS | Status: AC | PRN
  Administered 2023-05-14: 75 mL via INTRAVENOUS

## 2023-05-14 MED ORDER — LACTATED RINGERS IV BOLUS
1000.0000 mL | Freq: Once | INTRAVENOUS | Status: AC
  Administered 2023-05-14: 1000 mL via INTRAVENOUS

## 2023-05-14 MED ORDER — SODIUM CHLORIDE 0.9 % IV SOLN
1.0000 g | Freq: Once | INTRAVENOUS | Status: AC
  Administered 2023-05-15: 1 g via INTRAVENOUS
  Filled 2023-05-14: qty 10

## 2023-05-14 MED ORDER — MORPHINE SULFATE (PF) 4 MG/ML IV SOLN
4.0000 mg | Freq: Once | INTRAVENOUS | Status: AC
  Administered 2023-05-14: 4 mg via INTRAVENOUS
  Filled 2023-05-14: qty 1

## 2023-05-14 NOTE — ED Notes (Signed)
 Lab called about critical glucose of 37. CBG checked & results of 302. Theresia Lo DO aware. Lab is rerunning CMP.

## 2023-05-14 NOTE — ED Provider Notes (Signed)
 [facility] Provider Note   CSN: [id number] Arrival date & time: 05/14/23  2053     History  Chief Complaint  Patient presents with   Hyperglycemia    Olivia Burnett is a 73 y.o. female.  Patient is a 73 year old female with a past medical history of hypertension, diabetes, prior stroke with mild right-sided deficits, seizures presenting to the emergency department with nausea and vomiting.  Patient reports that she has been feeling sick for the last week with nausea and vomiting.  She states that she frequently has dysuria and hematuria and was recently diagnosed with a UTI.  She states that she does not think that she has started antibiotics yet and has fosfomycin on her medication list to be started tomorrow.  The patient states that she has been having fevers at the facility.  She also states that she has had increased pain in her chest, back and down her arms.  She states that she has been getting Tylenol for pain without significant relief.  States that she was previously on oxycodone for her pain but that this has been stopped since she has been at the nursing home. EMS reported she is bed bound. Patient reports she can ambulate but cannot walk far.  The history is provided by the patient, the EMS personnel and a relative.  Hyperglycemia      Home Medications Prior to Admission medications   Medication Sig Start Date End Date Taking? Authorizing Provider  acetaminophen (TYLENOL) 500 MG tablet Take 1,000 mg by mouth in the morning, at noon, and at bedtime.    [provider]  amLODipine (NORVASC) 10 MG tablet Take 10 mg by mouth daily. 02/01/23   [provider]  aspirin EC 81 MG tablet Take 81 mg by mouth daily. Swallow whole.    [provider]  atorvastatin (LIPITOR) 10 MG tablet Take 5 mg by mouth at bedtime. 02/01/23   [provider]  Carboxymethylcellulose Sodium (ARTIFICIAL TEARS OP) Place  1 drop into both eyes 4 (four) times daily.    [provider]  Cranberry 450 MG TABS Take 450 mg by mouth daily.    [provider]  gabapentin (NEURONTIN) 100 MG capsule Take 200 mg by mouth 2 (two) times daily. 02/01/23   [provider]  hydroxychloroquine (PLAQUENIL) 200 MG tablet Take 200 mg by mouth daily.    [provider]  insulin glargine (LANTUS) 100 UNIT/ML injection Inject 0.2 mLs (20 Units total) into the skin daily. 02/11/23   Elberta Fortis, MD  isosorbide mononitrate (IMDUR) 30 MG 24 hr tablet Take 30 mg by mouth daily.    [provider]  LACTOBACILLUS PO Take 1 capsule by mouth daily.    [provider]  levETIRAcetam (KEPPRA) 500 MG tablet Take 1 tablet (500 mg total) by mouth 2 (two) times daily. 02/11/23   Elberta Fortis, MD  LINZESS 145 MCG CAPS capsule Take 145 mcg by mouth daily.    [provider]  lisinopril (PRINIVIL,ZESTRIL) 10 MG tablet Take 10 mg by mouth daily.    [provider]  melatonin 3 MG TABS tablet Take 9 mg by mouth at bedtime.    [provider]  Menthol-Zinc Oxide (CALMOSEPTINE) 0.44-20.6 % OINT Apply 1 Application topically in the morning and at bedtime.    [provider]  metoprolol tartrate (LOPRESSOR) 25 MG tablet Take 12.5 mg by mouth 2 (two) times daily.  [provider]  nystatin cream (MYCOSTATIN) Apply 1 Application topically 2 (two) times daily as needed (rash).    [provider]  polyethylene glycol (MIRALAX / GLYCOLAX) packet Take 17 g by mouth daily.    [provider]  selenium sulfide (SELSUN) 1 % LOTN Apply 1 Application topically daily. On Wednesday and Friday    [provider]  sennosides-docusate sodium (SENOKOT-S) 8.6-50 MG tablet Take 2 tablets by mouth in the morning and at bedtime.    [provider]  sertraline (ZOLOFT) 50 MG tablet Take 75 mg by mouth daily.    [provider]   simethicone (MYLICON) 125 MG chewable tablet Chew 125 mg by mouth at bedtime as needed for flatulence.    [provider]  triamcinolone (KENALOG) 0.1 % paste Use as directed 1 Application in the mouth or throat at bedtime as needed (cold sores).    [provider]      Allergies    Ciprofloxacin, Gemifloxacin, Infliximab, Levaquin [levofloxacin], Meperidine, Mercury, Moxifloxacin, Ofloxacin, Penicillins, Phenobarbital, Pregabalin, and Prochlorperazine    Review of Systems   Review of Systems  Physical Exam Updated Vital Signs BP (!) 168/74   Pulse 96   Temp 99.4 F (37.4 C)   Resp 16   SpO2 99%  Physical Exam Vitals and nursing note reviewed.  Constitutional:      General: She is not in acute distress.    Appearance: Normal appearance. She is obese.  HENT:     Head: Normocephalic and atraumatic.     Nose: Nose normal.     Mouth/Throat:     Mouth: Mucous membranes are dry.     Pharynx: Oropharynx is clear.  Eyes:     Extraocular Movements: Extraocular movements intact.     Conjunctiva/sclera: Conjunctivae normal.  Cardiovascular:     Rate and Rhythm: Regular rhythm. Tachycardia present.     Heart sounds: Normal heart sounds.  Pulmonary:     Effort: Pulmonary effort is normal.     Breath sounds: Normal breath sounds.  Abdominal:     General: Abdomen is flat.     Palpations: Abdomen is soft.     Tenderness: There is no abdominal tenderness.  Musculoskeletal:        General: Normal range of motion.     Cervical back: Normal range of motion.  Skin:    General: Skin is warm and dry.  Neurological:     General: No focal deficit present.     Mental Status: She is alert and oriented to person, place, and time.  Psychiatric:        Mood and Affect: Mood normal.        Behavior: Behavior normal.     ED Results / Procedures / Treatments   Labs (all labs ordered are listed, but only abnormal results are displayed) Labs Reviewed  COMPREHENSIVE  METABOLIC PANEL - Abnormal; Notable for the following components:      Result Value   Sodium 130 (*)    CO2 19 (*)    Glucose, Bld 353 (*)    BUN 24 (*)    Creatinine, Ser 1.16 (*)    Calcium 8.6 (*)    GFR, Estimated 50 (*)    All other components within normal limits  CBC WITH DIFFERENTIAL/PLATELET - Abnormal; Notable for the following components:   WBC 10.9 (*)    RBC 3.62 (*)    Hemoglobin 11.3 (*)    HCT 32.1 (*)  Platelets 117 (*)    Neutro Abs 8.5 (*)    All other components within normal limits  BLOOD GAS, VENOUS - Abnormal; Notable for the following components:   pCO2, Ven 36 (*)    Bicarbonate 19.0 (*)    Acid-base deficit 6.3 (*)    All other components within normal limits  CBG MONITORING, ED - Abnormal; Notable for the following components:   Glucose-Capillary 351 (*)    All other components within normal limits  CBG MONITORING, ED - Abnormal; Notable for the following components:   Glucose-Capillary 302 (*)    All other components within normal limits  RESP PANEL BY RT-PCR (RSV, FLU A&B, COVID)  RVPGX2  CULTURE, BLOOD (ROUTINE X 2)  CULTURE, BLOOD (ROUTINE X 2)  BETA-HYDROXYBUTYRIC ACID  PROTIME-INR  APTT  URINALYSIS, W/ REFLEX TO CULTURE (INFECTION SUSPECTED)  I-STAT CG4 LACTIC ACID, ED  TROPONIN I (HIGH SENSITIVITY)    EKG None  Radiology DG Chest Port 1 View Result Date: 05/14/2023 CLINICAL DATA:  Possible sepsis EXAM: PORTABLE CHEST 1 VIEW COMPARISON:  03/23/2022 FINDINGS: The heart size and mediastinal contours are within normal limits. Both lungs are clear. The visualized skeletal structures are unremarkable. IMPRESSION: No active disease. Electronically Signed   By: Alcide Clever M.D.   On: 05/14/2023 21:33    Procedures Procedures    Medications Ordered in ED Medications  cefTRIAXone (ROCEPHIN) 1 g in sodium chloride 0.9 % 100 mL IVPB (has no administration in time range)  lactated ringers bolus 1,000 mL (1,000 mLs Intravenous New  Bag/Given 05/14/23 2151)  ondansetron (ZOFRAN) injection 4 mg (4 mg Intravenous Given 05/14/23 2152)  morphine (PF) 4 MG/ML injection 4 mg (4 mg Intravenous Given 05/14/23 2153)  morphine (PF) 4 MG/ML injection 4 mg (4 mg Intravenous Given 05/14/23 2323)  ondansetron (ZOFRAN) injection 4 mg (4 mg Intravenous Given 05/14/23 2322)  iohexol (OMNIPAQUE) 350 MG/ML injection 75 mL (75 mLs Intravenous Contrast Given 05/14/23 2346)    ED Course/ Medical Decision Making/ A&P Clinical Course as of 05/14/23 2353  Fri May 14, 2023  2223 Lab reported glucose 37, repeat CBG 302, likely lab error. BHB normal and no AGAP making DKA unlikely. [VK]  2255 Patient has mild leukocytosis but no other signs of sepsis. UA yesterday positive for UTI and has antibiotics ordered at her facility. With recent diagnosis yesterday I would not call this failure of outpatient antibiotics. [VK]  2309 Patient is still having a lot chest and back pain and is still tachy. Trop normal and CXR without acute disease. Will have CTPE. [VK]  2352 Patient signed out to Dr. Nicanor Alcon pending CTPE study. [VK]    Clinical Course User Index [VK] Rexford Maus, DO                                 Medical Decision Making This patient presents to the ED with chief complaint(s) of N/V, body pain with pertinent past medical history of HTN, DM, prior CVA, seizure disorder which further complicates the presenting complaint. The complaint involves an extensive differential diagnosis and also carries with it a high risk of complications and morbidity.    The differential diagnosis includes hyperglycemic crisis, dehydration, electrolyte abnormality, UTI, sepsis, arrhythmia, anemia, viral syndrome, ACS, pericarditis, myocarditis, pneumonia, pneumothorax, pulmonary edema, pleural effusion.  Additional history obtained: Additional history obtained from family and EMS  Records reviewed Nursing Home Documents  ED Course and  Reassessment: On  patient's arrival she is mildly tachycardic and otherwise hemodynamically stable in no acute distress.  Glucose on arrival was in the 300s.  Will have labs to evaluate for etiology of her symptoms as well as EKG, chest x-ray and urine.  She will be started on fluids and was given pain and nausea control and will be closely reassessed.  Independent labs interpretation:  The following labs were independently interpreted: hyperglycemia without DKA  Independent visualization of imaging: - I independently visualized the following imaging with scope of interpretation limited to determining acute life threatening conditions related to emergency care: CXR, which revealed no acute disease    Amount and/or Complexity of Data Reviewed Labs: ordered. Radiology: ordered.  Risk Prescription drug management.          Final Clinical Impression(s) / ED Diagnoses Final diagnoses:  Hyperglycemia  History of UTI    Rx / DC Orders ED Discharge Orders     None         Rexford Maus, DO 05/14/23 2353

## 2023-05-14 NOTE — ED Triage Notes (Signed)
 Pt BIB [facility] from [facility]. Pt co n+v 1 week as well as generalized body aches. Pt c/o chest pain upon movement. Pt cbg 372. Hx Diabetes, stroke. Pt bed bound 176/90 98HR 98% RA 4mg  zofran administered IM

## 2023-05-15 ENCOUNTER — Encounter (HOSPITAL_COMMUNITY): Payer: Self-pay | Admitting: Family Medicine

## 2023-05-15 DIAGNOSIS — Z515 Encounter for palliative care: Secondary | ICD-10-CM | POA: Diagnosis not present

## 2023-05-15 DIAGNOSIS — D693 Immune thrombocytopenic purpura: Secondary | ICD-10-CM

## 2023-05-15 DIAGNOSIS — I21A1 Myocardial infarction type 2: Secondary | ICD-10-CM | POA: Diagnosis not present

## 2023-05-15 DIAGNOSIS — Z9861 Coronary angioplasty status: Secondary | ICD-10-CM

## 2023-05-15 DIAGNOSIS — I4891 Unspecified atrial fibrillation: Secondary | ICD-10-CM | POA: Diagnosis not present

## 2023-05-15 DIAGNOSIS — N1831 Chronic kidney disease, stage 3a: Secondary | ICD-10-CM

## 2023-05-15 DIAGNOSIS — G9341 Metabolic encephalopathy: Secondary | ICD-10-CM | POA: Diagnosis not present

## 2023-05-15 DIAGNOSIS — L4050 Arthropathic psoriasis, unspecified: Secondary | ICD-10-CM | POA: Diagnosis present

## 2023-05-15 DIAGNOSIS — Z66 Do not resuscitate: Secondary | ICD-10-CM | POA: Diagnosis present

## 2023-05-15 DIAGNOSIS — R569 Unspecified convulsions: Secondary | ICD-10-CM | POA: Diagnosis not present

## 2023-05-15 DIAGNOSIS — I129 Hypertensive chronic kidney disease with stage 1 through stage 4 chronic kidney disease, or unspecified chronic kidney disease: Secondary | ICD-10-CM | POA: Diagnosis present

## 2023-05-15 DIAGNOSIS — J189 Pneumonia, unspecified organism: Secondary | ICD-10-CM | POA: Diagnosis present

## 2023-05-15 DIAGNOSIS — N179 Acute kidney failure, unspecified: Secondary | ICD-10-CM | POA: Diagnosis not present

## 2023-05-15 DIAGNOSIS — F0394 Unspecified dementia, unspecified severity, with anxiety: Secondary | ICD-10-CM | POA: Diagnosis present

## 2023-05-15 DIAGNOSIS — F0393 Unspecified dementia, unspecified severity, with mood disturbance: Secondary | ICD-10-CM | POA: Diagnosis present

## 2023-05-15 DIAGNOSIS — J9601 Acute respiratory failure with hypoxia: Secondary | ICD-10-CM | POA: Diagnosis not present

## 2023-05-15 DIAGNOSIS — I214 Non-ST elevation (NSTEMI) myocardial infarction: Secondary | ICD-10-CM | POA: Diagnosis not present

## 2023-05-15 DIAGNOSIS — N390 Urinary tract infection, site not specified: Secondary | ICD-10-CM | POA: Diagnosis present

## 2023-05-15 DIAGNOSIS — I4819 Other persistent atrial fibrillation: Secondary | ICD-10-CM | POA: Diagnosis not present

## 2023-05-15 DIAGNOSIS — R6520 Severe sepsis without septic shock: Secondary | ICD-10-CM | POA: Diagnosis not present

## 2023-05-15 DIAGNOSIS — I69351 Hemiplegia and hemiparesis following cerebral infarction affecting right dominant side: Secondary | ICD-10-CM | POA: Diagnosis not present

## 2023-05-15 DIAGNOSIS — I2510 Atherosclerotic heart disease of native coronary artery without angina pectoris: Secondary | ICD-10-CM | POA: Diagnosis not present

## 2023-05-15 DIAGNOSIS — N3000 Acute cystitis without hematuria: Secondary | ICD-10-CM

## 2023-05-15 DIAGNOSIS — R079 Chest pain, unspecified: Secondary | ICD-10-CM | POA: Diagnosis not present

## 2023-05-15 DIAGNOSIS — E8721 Acute metabolic acidosis: Secondary | ICD-10-CM | POA: Diagnosis not present

## 2023-05-15 DIAGNOSIS — I7121 Aneurysm of the ascending aorta, without rupture: Secondary | ICD-10-CM | POA: Diagnosis present

## 2023-05-15 DIAGNOSIS — Z1612 Extended spectrum beta lactamase (ESBL) resistance: Secondary | ICD-10-CM | POA: Diagnosis present

## 2023-05-15 DIAGNOSIS — E1122 Type 2 diabetes mellitus with diabetic chronic kidney disease: Secondary | ICD-10-CM | POA: Diagnosis present

## 2023-05-15 DIAGNOSIS — R4182 Altered mental status, unspecified: Secondary | ICD-10-CM | POA: Diagnosis not present

## 2023-05-15 DIAGNOSIS — Z8673 Personal history of transient ischemic attack (TIA), and cerebral infarction without residual deficits: Secondary | ICD-10-CM

## 2023-05-15 DIAGNOSIS — Z794 Long term (current) use of insulin: Secondary | ICD-10-CM

## 2023-05-15 DIAGNOSIS — I10 Essential (primary) hypertension: Secondary | ICD-10-CM

## 2023-05-15 DIAGNOSIS — A419 Sepsis, unspecified organism: Secondary | ICD-10-CM | POA: Diagnosis not present

## 2023-05-15 DIAGNOSIS — G40909 Epilepsy, unspecified, not intractable, without status epilepticus: Secondary | ICD-10-CM | POA: Diagnosis present

## 2023-05-15 DIAGNOSIS — D696 Thrombocytopenia, unspecified: Secondary | ICD-10-CM | POA: Diagnosis present

## 2023-05-15 DIAGNOSIS — E871 Hypo-osmolality and hyponatremia: Secondary | ICD-10-CM | POA: Diagnosis not present

## 2023-05-15 DIAGNOSIS — Z1152 Encounter for screening for COVID-19: Secondary | ICD-10-CM | POA: Diagnosis not present

## 2023-05-15 LAB — CBC
HCT: 29.5 % — ABNORMAL LOW (ref 36.0–46.0)
Hemoglobin: 10.3 g/dL — ABNORMAL LOW (ref 12.0–15.0)
MCH: 31.1 pg (ref 26.0–34.0)
MCHC: 34.9 g/dL (ref 30.0–36.0)
MCV: 89.1 fL (ref 80.0–100.0)
Platelets: 109 10*3/uL — ABNORMAL LOW (ref 150–400)
RBC: 3.31 MIL/uL — ABNORMAL LOW (ref 3.87–5.11)
RDW: 13.9 % (ref 11.5–15.5)
WBC: 10.9 10*3/uL — ABNORMAL HIGH (ref 4.0–10.5)
nRBC: 0 % (ref 0.0–0.2)

## 2023-05-15 LAB — URINALYSIS, W/ REFLEX TO CULTURE (INFECTION SUSPECTED)
Bilirubin Urine: NEGATIVE
Glucose, UA: 50 mg/dL — AB
Ketones, ur: NEGATIVE mg/dL
Nitrite: NEGATIVE
Protein, ur: 100 mg/dL — AB
Specific Gravity, Urine: 1.029 (ref 1.005–1.030)
WBC, UA: >50 WBC/hpf (ref 0–5)
pH: 5 (ref 5.0–8.0)

## 2023-05-15 LAB — AMMONIA: Ammonia: 23 umol/L (ref 9–35)

## 2023-05-15 LAB — BASIC METABOLIC PANEL
Anion gap: 7 (ref 5–15)
BUN: 20 mg/dL (ref 8–23)
CO2: 20 mmol/L — ABNORMAL LOW (ref 22–32)
Calcium: 8.3 mg/dL — ABNORMAL LOW (ref 8.9–10.3)
Chloride: 104 mmol/L (ref 98–111)
Creatinine, Ser: 1.09 mg/dL — ABNORMAL HIGH (ref 0.44–1.00)
GFR, Estimated: 54 mL/min — ABNORMAL LOW (ref >60)
Glucose, Bld: 216 mg/dL — ABNORMAL HIGH (ref 70–99)
Potassium: 3.9 mmol/L (ref 3.5–5.1)
Sodium: 131 mmol/L — ABNORMAL LOW (ref 135–145)

## 2023-05-15 LAB — STREP PNEUMONIAE URINARY ANTIGEN: Strep Pneumo Urinary Antigen: NEGATIVE

## 2023-05-15 LAB — CBG MONITORING, ED
Glucose-Capillary: 259 mg/dL — ABNORMAL HIGH (ref 70–99)
Glucose-Capillary: 180 mg/dL — ABNORMAL HIGH (ref 70–99)
Glucose-Capillary: 148 mg/dL — ABNORMAL HIGH (ref 70–99)

## 2023-05-15 LAB — GLUCOSE, CAPILLARY
Glucose-Capillary: 145 mg/dL — ABNORMAL HIGH (ref 70–99)
Glucose-Capillary: 127 mg/dL — ABNORMAL HIGH (ref 70–99)

## 2023-05-15 LAB — MRSA NEXT GEN BY PCR, NASAL: MRSA by PCR Next Gen: NOT DETECTED

## 2023-05-15 MED ORDER — GUAIFENESIN 100 MG/5ML PO LIQD: 5.0000 mL | ORAL | Status: DC | PRN

## 2023-05-15 MED ORDER — SERTRALINE HCL 50 MG PO TABS
75.0000 mg | ORAL_TABLET | Freq: Every day | ORAL | Status: DC
  Administered 2023-05-15 – 2023-05-24 (×10): 75 mg via ORAL
  Filled 2023-05-15: qty 2
  Filled 2023-05-15 (×5): qty 1
  Filled 2023-05-15 (×3): qty 2
  Filled 2023-05-15: qty 1

## 2023-05-15 MED ORDER — INSULIN ASPART 100 UNIT/ML IJ SOLN
0.0000 [IU] | Freq: Three times a day (TID) | INTRAMUSCULAR | Status: DC
  Administered 2023-05-15: 2 [IU] via SUBCUTANEOUS
  Administered 2023-05-15 – 2023-05-16 (×3): 1 [IU] via SUBCUTANEOUS
  Administered 2023-05-18 (×2): 2 [IU] via SUBCUTANEOUS
  Administered 2023-05-18: 1 [IU] via SUBCUTANEOUS
  Administered 2023-05-19 – 2023-05-20 (×2): 2 [IU] via SUBCUTANEOUS
  Administered 2023-05-20 – 2023-05-21 (×3): 3 [IU] via SUBCUTANEOUS
  Administered 2023-05-21 (×2): 1 [IU] via SUBCUTANEOUS
  Administered 2023-05-22: 2 [IU] via SUBCUTANEOUS
  Administered 2023-05-22: 1 [IU] via SUBCUTANEOUS
  Administered 2023-05-22 – 2023-05-24 (×6): 2 [IU] via SUBCUTANEOUS
  Filled 2023-05-15: qty 0.09

## 2023-05-15 MED ORDER — INSULIN GLARGINE 100 UNIT/ML ~~LOC~~ SOLN
15.0000 [IU] | Freq: Every day | SUBCUTANEOUS | Status: DC
  Administered 2023-05-15 – 2023-05-23 (×9): 15 [IU] via SUBCUTANEOUS
  Filled 2023-05-15 (×11): qty 0.15

## 2023-05-15 MED ORDER — AMLODIPINE BESYLATE 10 MG PO TABS
10.0000 mg | ORAL_TABLET | Freq: Every day | ORAL | Status: DC
  Administered 2023-05-15 – 2023-05-16 (×2): 10 mg via ORAL
  Filled 2023-05-15: qty 2
  Filled 2023-05-15: qty 1

## 2023-05-15 MED ORDER — OXYCODONE HCL 5 MG PO TABS
5.0000 mg | ORAL_TABLET | ORAL | Status: DC | PRN
  Administered 2023-05-16: 10 mg via ORAL
  Administered 2023-05-16 (×2): 5 mg via ORAL
  Administered 2023-05-17 (×2): 10 mg via ORAL
  Filled 2023-05-15: qty 2
  Filled 2023-05-15 (×2): qty 1
  Filled 2023-05-15 (×2): qty 2

## 2023-05-15 MED ORDER — LINACLOTIDE 145 MCG PO CAPS
145.0000 ug | ORAL_CAPSULE | Freq: Every day | ORAL | Status: DC
  Administered 2023-05-15 – 2023-05-24 (×10): 145 ug via ORAL
  Filled 2023-05-15 (×10): qty 1

## 2023-05-15 MED ORDER — LISINOPRIL 10 MG PO TABS
10.0000 mg | ORAL_TABLET | Freq: Every day | ORAL | Status: DC
  Administered 2023-05-15 – 2023-05-16 (×2): 10 mg via ORAL
  Filled 2023-05-15 (×2): qty 1

## 2023-05-15 MED ORDER — VALPROIC ACID 250 MG PO CAPS
500.0000 mg | ORAL_CAPSULE | Freq: Three times a day (TID) | ORAL | Status: DC
  Administered 2023-05-15 – 2023-05-24 (×26): 500 mg via ORAL
  Filled 2023-05-15 (×28): qty 2

## 2023-05-15 MED ORDER — GABAPENTIN 100 MG PO CAPS
200.0000 mg | ORAL_CAPSULE | Freq: Two times a day (BID) | ORAL | Status: DC
Start: 2023-05-15 — End: 2023-05-24
  Administered 2023-05-15 – 2023-05-24 (×19): 200 mg via ORAL
  Filled 2023-05-15 (×19): qty 2

## 2023-05-15 MED ORDER — SODIUM CHLORIDE 0.9 % IV SOLN
2.0000 g | INTRAVENOUS | Status: DC
  Administered 2023-05-16 (×2): 2 g via INTRAVENOUS
  Filled 2023-05-15 (×2): qty 20

## 2023-05-15 MED ORDER — AZITHROMYCIN 250 MG PO TABS
500.0000 mg | ORAL_TABLET | Freq: Every day | ORAL | Status: AC
  Administered 2023-05-15 – 2023-05-19 (×5): 500 mg via ORAL
  Filled 2023-05-15 (×5): qty 2

## 2023-05-15 MED ORDER — VANCOMYCIN HCL IN DEXTROSE 1-5 GM/200ML-% IV SOLN
1000.0000 mg | Freq: Once | INTRAVENOUS | Status: DC
  Filled 2023-05-15: qty 200

## 2023-05-15 MED ORDER — DEXTROSE 5 % IV SOLN
1500.0000 mg | Freq: Once | INTRAVENOUS | Status: AC
  Administered 2023-05-15: 1500 mg via INTRAVENOUS
  Filled 2023-05-15: qty 15

## 2023-05-15 MED ORDER — ISOSORBIDE MONONITRATE ER 30 MG PO TB24
30.0000 mg | ORAL_TABLET | Freq: Every day | ORAL | Status: DC
  Administered 2023-05-15 – 2023-05-24 (×10): 30 mg via ORAL
  Filled 2023-05-15 (×10): qty 1

## 2023-05-15 MED ORDER — LEVETIRACETAM 500 MG PO TABS: 500.0000 mg | ORAL_TABLET | Freq: Two times a day (BID) | ORAL | Status: DC

## 2023-05-15 MED ORDER — MELATONIN 3 MG PO TABS
3.0000 mg | ORAL_TABLET | Freq: Every evening | ORAL | Status: DC | PRN
  Administered 2023-05-16 – 2023-05-24 (×6): 3 mg via ORAL
  Filled 2023-05-15 (×7): qty 1

## 2023-05-15 MED ORDER — ENOXAPARIN SODIUM 40 MG/0.4ML IJ SOSY
40.0000 mg | PREFILLED_SYRINGE | INTRAMUSCULAR | Status: DC
  Administered 2023-05-15 – 2023-05-16 (×2): 40 mg via SUBCUTANEOUS
  Filled 2023-05-15 (×2): qty 0.4

## 2023-05-15 MED ORDER — ATORVASTATIN CALCIUM 10 MG PO TABS
5.0000 mg | ORAL_TABLET | Freq: Every day | ORAL | Status: DC
  Administered 2023-05-15 – 2023-05-17 (×3): 5 mg via ORAL
  Filled 2023-05-15 (×3): qty 1

## 2023-05-15 MED ORDER — POLYETHYLENE GLYCOL 3350 17 G PO PACK: 17.0000 g | PACK | Freq: Every day | ORAL | Status: DC | PRN

## 2023-05-15 MED ORDER — ACETAMINOPHEN 650 MG RE SUPP: 650.0000 mg | Freq: Four times a day (QID) | RECTAL | Status: DC | PRN

## 2023-05-15 MED ORDER — INSULIN ASPART 100 UNIT/ML IJ SOLN
0.0000 [IU] | Freq: Every day | INTRAMUSCULAR | Status: DC
  Administered 2023-05-15: 3 [IU] via SUBCUTANEOUS
  Administered 2023-05-20: 2 [IU] via SUBCUTANEOUS
  Filled 2023-05-15: qty 0.05

## 2023-05-15 MED ORDER — SODIUM CHLORIDE 0.9 % IV SOLN: 2.0000 g | INTRAVENOUS | Status: DC

## 2023-05-15 MED ORDER — METOPROLOL TARTRATE 12.5 MG HALF TABLET
12.5000 mg | ORAL_TABLET | Freq: Two times a day (BID) | ORAL | Status: DC
  Administered 2023-05-15 – 2023-05-19 (×10): 12.5 mg via ORAL
  Filled 2023-05-15 (×10): qty 1

## 2023-05-15 MED ORDER — ACETAMINOPHEN 325 MG PO TABS
650.0000 mg | ORAL_TABLET | Freq: Four times a day (QID) | ORAL | Status: DC | PRN
  Administered 2023-05-16 – 2023-05-24 (×12): 650 mg via ORAL
  Filled 2023-05-15 (×13): qty 2

## 2023-05-15 MED ORDER — BISACODYL 5 MG PO TBEC: 5.0000 mg | DELAYED_RELEASE_TABLET | Freq: Every day | ORAL | Status: DC | PRN

## 2023-05-15 MED ORDER — HYDROXYCHLOROQUINE SULFATE 200 MG PO TABS
200.0000 mg | ORAL_TABLET | Freq: Every day | ORAL | Status: DC
  Administered 2023-05-15 – 2023-05-24 (×10): 200 mg via ORAL
  Filled 2023-05-15 (×11): qty 1

## 2023-05-15 MED ORDER — ASPIRIN 81 MG PO TBEC
81.0000 mg | DELAYED_RELEASE_TABLET | Freq: Every day | ORAL | Status: DC
  Administered 2023-05-15 – 2023-05-24 (×10): 81 mg via ORAL
  Filled 2023-05-15 (×10): qty 1

## 2023-05-15 NOTE — H&P (Signed)
 History and Physical    Olivia Burnett GNF:[id number] DOB: [birth year] DOA: 05/14/2023  PCP: System, Provider Not In   Patient coming from: SNF   Chief Complaint: Dysuria, cough, SOB, pleuritic chest pain, fevers  HPI: Olivia Burnett is a 73 y.o. female with medical history significant for hypertension, insulin-dependent diabetes mellitus, CAD, chronic ITP, psoriatic arthritis, CVA with right-sided weakness presents with dysuria, productive cough, shortness of breath, fevers, and pleuritic chest pain.  Patient reports weeks of dysuria, more recent productive cough and shortness of breath, chest and back when she takes a deep breath.  She reports recent fevers at her nursing facility.  ED Course: Upon arrival to the ED, patient is found to be afebrile and saturating well on room air with mild tachycardia and elevated blood pressure.  Labs are most notable for glucose 353, creatinine 1.16, WBC 10,900, normal lactate, platelets 117,000, negative respiratory virus panel, and normal troponin.  CTA chest is negative for PE but concerning for multifocal pneumonia.  Blood and urine cultures were collected in the ED and the patient was treated with antibiotics, antiemetics, analgesics, and IVF.  Review of Systems:  All other systems reviewed and apart from HPI, are negative.  Past Medical History:  Diagnosis Date   CAD S/P percutaneous coronary angioplasty 05/15/2023   Chronic ITP (idiopathic thrombocytopenia) (HCC) 05/15/2023   Diabetes mellitus without complication (HCC)    Essential hypertension 05/15/2023   Ganglion cyst    Hypertension    Psoriatic arthritis (HCC) 05/15/2023   Seizures (HCC)    Stroke [facility])     Past Surgical History:  Procedure Laterality Date   ABDOMINAL HYSTERECTOMY     APPENDECTOMY     TONSILLECTOMY      Social History:   reports that she has never smoked. She does not have any smokeless tobacco history on file. She reports that she does not drink  alcohol and does not use drugs.  Allergies  Allergen Reactions   Ciprofloxacin     Per MAR   Gemifloxacin     Per MAR   Infliximab Other (See Comments)    Unknown reaction  unknown   Levaquin [Levofloxacin]     Per MAR   Meperidine Other (See Comments)    Unknown reaction  unknown   Mercury     hives   Moxifloxacin     Per MAR   Ofloxacin     Per MAR   Penicillins     rash   Phenobarbital Other (See Comments)    Unknown reaction  unknown   Pregabalin Other (See Comments)    Dry mouth, confusion, incontinence   Prochlorperazine Other (See Comments)    Unknown reaction  unknown    History reviewed. No pertinent family history.   Prior to Admission medications   Medication Sig Start Date End Date Taking? Authorizing Provider  acetaminophen (TYLENOL) 500 MG tablet Take 1,000 mg by mouth in the morning, at noon, and at bedtime.    [provider]  amLODipine (NORVASC) 10 MG tablet Take 10 mg by mouth daily. 02/01/23   [provider]  aspirin EC 81 MG tablet Take 81 mg by mouth daily. Swallow whole.    [provider]  atorvastatin (LIPITOR) 10 MG tablet Take 5 mg by mouth at bedtime. 02/01/23   [provider]  Carboxymethylcellulose Sodium (ARTIFICIAL TEARS OP) Place 1 drop into both eyes 4 (four) times daily.    [provider]  Cranberry 450 MG TABS Take  450 mg by mouth daily.    [provider]  gabapentin (NEURONTIN) 100 MG capsule Take 200 mg by mouth 2 (two) times daily. 02/01/23   [provider]  hydroxychloroquine (PLAQUENIL) 200 MG tablet Take 200 mg by mouth daily.    [provider]  insulin glargine (LANTUS) 100 UNIT/ML injection Inject 0.2 mLs (20 Units total) into the skin daily. 02/11/23   Elberta Fortis, MD  isosorbide mononitrate (IMDUR) 30 MG 24 hr tablet Take 30 mg by mouth daily.    [provider]  LACTOBACILLUS PO Take 1 capsule by mouth daily.    [provider]  levETIRAcetam (KEPPRA) 500 MG tablet Take 1 tablet (500 mg total) by mouth 2 (two) times daily. 02/11/23   Elberta Fortis, MD  LINZESS 145 MCG CAPS capsule Take 145 mcg by mouth daily.    [provider]  lisinopril (PRINIVIL,ZESTRIL) 10 MG tablet Take 10 mg by mouth daily.    [provider]  melatonin 3 MG TABS tablet Take 9 mg by mouth at bedtime.    [provider]  Menthol-Zinc Oxide (CALMOSEPTINE) 0.44-20.6 % OINT Apply 1 Application topically in the morning and at bedtime.    [provider]  metoprolol tartrate (LOPRESSOR) 25 MG tablet Take 12.5 mg by mouth 2 (two) times daily.    [provider]  nystatin cream (MYCOSTATIN) Apply 1 Application topically 2 (two) times daily as needed (rash).    [provider]  polyethylene glycol (MIRALAX / GLYCOLAX) packet Take 17 g by mouth daily.    [provider]  selenium sulfide (SELSUN) 1 % LOTN Apply 1 Application topically daily. On Wednesday and Friday    [provider]  sennosides-docusate sodium (SENOKOT-S) 8.6-50 MG tablet Take 2 tablets by mouth in the morning and at bedtime.    [provider]  sertraline (ZOLOFT) 50 MG tablet Take 75 mg by mouth daily.    [provider]  simethicone (MYLICON) 125 MG chewable tablet Chew 125 mg by mouth at bedtime as needed for flatulence.    [provider]  triamcinolone (KENALOG) 0.1 % paste Use as directed 1 Application in the mouth or throat at bedtime as needed (cold sores).    [provider]    Physical Exam: Vitals:   05/14/23 2104 05/15/23 0010 05/15/23 0105 05/15/23 0113  BP: (!) 168/74 (!) 175/70 (!) 178/79   Pulse: 96 (!) 104 (!) 112   Resp: 16 14 18    Temp: 99.4 F (37.4 C)   99.3 F (37.4 C)  TempSrc:    Oral  SpO2: 99% 98% 96%   Weight:  74.8 kg      Constitutional: NAD, no pallor or diaphoresis    Eyes: PERTLA, lids and conjunctivae normal ENMT:  Mucous membranes are moist. Posterior pharynx clear of any exudate or lesions.   Neck: supple, no masses  Respiratory: Coarse rales. No wheezing. No accessory muscle use.  Cardiovascular: S1 & S2 heard, regular rate and rhythm. No extremity edema.  Abdomen: Soft, no guarding. Bowel sounds active.  Musculoskeletal: no clubbing / cyanosis. No joint deformity upper and lower extremities.   Skin: no significant rashes, lesions, ulcers. Warm, dry, well-perfused. Neurologic: Mild dysarthria. Right hemiparesis. Alert and oriented to person, place, and situation.  Psychiatric: Pleasant. Cooperative.    Labs and Imaging on Admission: I have personally reviewed following labs and imaging studies  CBC: Recent Labs  Lab 05/14/23 2122  WBC 10.9*  NEUTROABS  8.5*  HGB 11.3*  HCT 32.1*  MCV 88.7  PLT 117*   Basic Metabolic Panel: Recent Labs  Lab 05/14/23 2122  NA 130*  K 3.9  CL 103  CO2 19*  GLUCOSE 353*  BUN 24*  CREATININE 1.16*  CALCIUM 8.6*   GFR: Estimated Creatinine Clearance: 48.3 mL/min (A) (by C-G formula based on SCr of 1.16 mg/dL (H)). Liver Function Tests: Recent Labs  Lab 05/14/23 2122  AST 21  ALT 12  ALKPHOS 68  BILITOT 0.7  PROT 7.1  ALBUMIN 4.0   No results for input(s): "LIPASE", "AMYLASE" in the last 168 hours. No results for input(s): "AMMONIA" in the last 168 hours. Coagulation Profile: Recent Labs  Lab 05/14/23 2135  INR 1.1   Cardiac Enzymes: No results for input(s): "CKTOTAL", "CKMB", "CKMBINDEX", "TROPONINI" in the last 168 hours. BNP (last 3 results) No results for input(s): "PROBNP" in the last 8760 hours. HbA1C: No results for input(s): "HGBA1C" in the last 72 hours. CBG: Recent Labs  Lab 05/14/23 2103 05/14/23 2220  GLUCAP 351* 302*   Lipid Profile: No results for input(s): "CHOL", "HDL", "LDLCALC", "TRIG", "CHOLHDL", "LDLDIRECT" in the last 72 hours. Thyroid Function Tests: No results for input(s): "TSH", "T4TOTAL", "FREET4",  "T3FREE", "THYROIDAB" in the last 72 hours. Anemia Panel: No results for input(s): "VITAMINB12", "FOLATE", "FERRITIN", "TIBC", "IRON", "RETICCTPCT" in the last 72 hours. Urine analysis:    Component Value Date/Time   COLORURINE YELLOW 05/15/2023 0029   APPEARANCEUR CLOUDY (A) 05/15/2023 0029   LABSPEC 1.029 05/15/2023 0029   PHURINE 5.0 05/15/2023 0029   GLUCOSEU 50 (A) 05/15/2023 0029   HGBUR SMALL (A) 05/15/2023 0029   BILIRUBINUR NEGATIVE 05/15/2023 0029   KETONESUR NEGATIVE 05/15/2023 0029   PROTEINUR 100 (A) 05/15/2023 0029   NITRITE NEGATIVE 05/15/2023 0029   LEUKOCYTESUR LARGE (A) 05/15/2023 0029   Sepsis Labs: @LABRCNTIP (procalcitonin:4,lacticidven:4) ) Recent Results (from the past 240 hours)  Resp panel by RT-PCR (RSV, Flu A&B, Covid) Anterior Nasal Swab     Status: None   Collection Time: 05/14/23 10:00 PM   Specimen: Anterior Nasal Swab  Result Value Ref Range Status   SARS Coronavirus 2 by RT PCR NEGATIVE NEGATIVE Final    Comment: (NOTE) SARS-CoV-2 target nucleic acids are NOT DETECTED.  The SARS-CoV-2 RNA is generally detectable in upper respiratory specimens during the acute phase of infection. The lowest concentration of SARS-CoV-2 viral copies this assay can detect is 138 copies/mL. A negative result does not preclude SARS-Cov-2 infection and should not be used as the sole basis for treatment or other patient management decisions. A negative result may occur with  improper specimen collection/handling, submission of specimen other than nasopharyngeal swab, presence of viral mutation(s) within the areas targeted by this assay, and inadequate number of viral copies(<138 copies/mL). A negative result must be combined with clinical observations, patient history, and epidemiological information. The expected result is Negative.  Fact Sheet for Patients:  BloggerCourse.com  Fact Sheet for Healthcare Providers:   SeriousBroker.it  This test is no t yet approved or cleared by the [place] FDA and  has been authorized for detection and/or diagnosis of SARS-CoV-2 by FDA under an Emergency Use Authorization (EUA). This EUA will remain  in effect (meaning this test can be used) for the duration of the COVID-19 declaration under Section 564(b)(1) of the Act, 21 U.S.C.section 360bbb-3(b)(1), unless the authorization is terminated  or revoked sooner.       Influenza A by PCR NEGATIVE NEGATIVE Final  Influenza B by PCR NEGATIVE NEGATIVE Final    Comment: (NOTE) The Xpert Xpress SARS-CoV-2/FLU/RSV plus assay is intended as an aid in the diagnosis of influenza from Nasopharyngeal swab specimens and should not be used as a sole basis for treatment. Nasal washings and aspirates are unacceptable for Xpert Xpress SARS-CoV-2/FLU/RSV testing.  Fact Sheet for Patients: BloggerCourse.com  Fact Sheet for Healthcare Providers: SeriousBroker.it  This test is not yet approved or cleared by the [place] FDA and has been authorized for detection and/or diagnosis of SARS-CoV-2 by FDA under an Emergency Use Authorization (EUA). This EUA will remain in effect (meaning this test can be used) for the duration of the COVID-19 declaration under Section 564(b)(1) of the Act, 21 U.S.C. section 360bbb-3(b)(1), unless the authorization is terminated or revoked.     Resp Syncytial Virus by PCR NEGATIVE NEGATIVE Final    Comment: (NOTE) Fact Sheet for Patients: BloggerCourse.com  Fact Sheet for Healthcare Providers: SeriousBroker.it  This test is not yet approved or cleared by the [place] FDA and has been authorized for detection and/or diagnosis of SARS-CoV-2 by FDA under an Emergency Use Authorization (EUA). This EUA will remain in effect (meaning this test can be used) for  the duration of the COVID-19 declaration under Section 564(b)(1) of the Act, 21 U.S.C. section 360bbb-3(b)(1), unless the authorization is terminated or revoked.  Performed at [facility], [address] [place], [place] [address]      Radiological Exams on Admission: CT Angio Chest PE W and/or Wo Contrast Result Date: 05/15/2023 CLINICAL DATA:  Pulmonary embolus suspected with high probability. EXAM: CT ANGIOGRAPHY CHEST WITH CONTRAST TECHNIQUE: Multidetector CT imaging of the chest was performed using the standard protocol during bolus administration of intravenous contrast. Multiplanar CT image reconstructions and MIPs were obtained to evaluate the vascular anatomy. RADIATION DOSE REDUCTION: This exam was performed according to the departmental dose-optimization program which includes automated exposure control, adjustment of the mA and/or kV according to patient size and/or use of iterative reconstruction technique. CONTRAST:  75mL OMNIPAQUE IOHEXOL 350 MG/ML SOLN COMPARISON:  Chest radiograph 05/14/2023.  CT chest 10/08/2017 FINDINGS: Cardiovascular: Technically adequate study with good opacification of the central and segmental pulmonary arteries. Minimal motion artifact. No focal filling defects are identified. No evidence of significant pulmonary embolus. Cardiac enlargement with prominence of the left atrium. No pericardial effusion. Normal caliber thoracic aorta. No dissection. Mediastinum/Nodes: Esophagus is decompressed. Scattered lymph nodes are not pathologically enlarged, likely reactive. Thyroid gland is unremarkable. Lungs/Pleura: Motion artifact limits evaluation of the lungs. There appears to be patchy infiltration in the lung bases and throughout the right lung. This could represent developing multifocal pneumonia. Slight fibrosis is suggested in the lung bases. No pleural effusion or pneumothorax. Upper Abdomen: Hepatic cirrhosis with enlarged lateral segment  left lobe and nodular contour. Splenic enlargement. Musculoskeletal: No chest wall abnormality. No acute or significant osseous findings. Review of the MIP images confirms the above findings. IMPRESSION: 1. No evidence of significant pulmonary embolus. 2. Mild cardiac enlargement. 3. Patchy infiltrates in the lungs, greater on the right. This is nonspecific but may represent early multifocal pneumonia. 4. Hepatic cirrhosis with splenic enlargement. Electronically Signed   By: Burman Nieves M.D.   On: 05/15/2023 00:07   DG Chest Port 1 View Result Date: 05/14/2023 CLINICAL DATA:  Possible sepsis EXAM: PORTABLE CHEST 1 VIEW COMPARISON:  03/23/2022 FINDINGS: The heart size and mediastinal contours are within normal limits. Both lungs are clear. The visualized skeletal structures  are unremarkable. IMPRESSION: No active disease. Electronically Signed   By: Alcide Clever M.D.   On: 05/14/2023 21:33    EKG: Independently reviewed. Sinus tachycardia, rate 100.   Assessment/Plan   1. Pneumonia  - Continue Rocephin, add azithromycin, check strep pneumo and legionella antigens, follow cultures and clinical course    2. UTI  - Continue Rocephin, follow cultures   3. Type II DM  - A1c was 8.2% in December 2024  - Continue basal insulin, add sliding scale correctional    4. Hypertension  - Norvasc, metoprolol, lisinopril    5. Chronic ITP  - Platelet count appears stable    6. Psoriatic arthritis  - Plaquenil   7. CAD  - ASA, Lipitor, metoprolol, Imdur   8. Hx of CVA  - ASA, Lipitor   9. CKD 3A  - Appears close to baseline  - Renally-dose medications   10. Seizures  - Patient believes that she was recently taken off of her antiepileptics, pharmacy medication-reconciliation is pending     DVT prophylaxis: Lovenox  Code Status: DNR  Level of Care: Level of care: Med-Surg Family Communication: None present  Disposition Plan:  Patient is from: SNF  Anticipated d/c is to: SNF   Anticipated d/c date is: 05/18/23  Patient currently: Pending treatment of pneumonia and UTI  Consults called: None  Admission status: Inpatient (PORT score is high)      Briscoe Deutscher, MD [facility]  05/15/2023, 2:14 AM

## 2023-05-15 NOTE — Progress Notes (Addendum)
 PROGRESS NOTE    Olivia Burnett  NGE:[id number] DOB: 19-Jun-1950 DOA: 05/14/2023 PCP: System, Provider Not In   Brief Narrative: 73 year old with past medical history significant for hypertension, insulin-dependent diabetes, CAD, chronic ITP, psoriatic arthritis, CVA with right-sided weakness, presented with dysuria, productive cough, shortness of breath, fevers and pleuritic chest pain.  Patient presented with mild tachycardia, elevated blood pressure, hyperglycemia CBG 353, white blood cell 10,000, CT chest negative for PE but concerning for multifocal pneumonia.  Assessment & Plan:   Principal Problem:   Multifocal pneumonia Active Problems:   CKD stage 3a, GFR 45-59 ml/min (HCC)   UTI (urinary tract infection)   History of stroke   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)   Essential hypertension   Seizures (HCC)   Thrombocytopenia (HCC)   CAD S/P percutaneous coronary angioplasty   Chronic ITP (idiopathic thrombocytopenia) (HCC)   Psoriatic arthritis (HCC)   1-Multifocal pneumonia: -Presents with cough, dyspnea and pleuritic chest pain. -CTA chest: Negative for PE .patchy infiltrates in the lungs, greater on the right. This is nonspecific but may represent early multifocal pneumonia. -Respiratory panel negative for influenza, COVID PCR negative -Follow blood cultures -Continue IV Ceftriaxone and oral azithromycin -Will ask speech for swallow evaluation  2-UTI: -Presents  with dysuria, UA with more than 50 white blood cells. -Follow urine culture  Hyponatremia: -Continue with IV fluids.   Tremor head, arms:  -For last 8 months.  -Needs follow up with neurology   Type 2 diabetes, hyperglycemia uncontrolled -Continue Lantus and sliding scale insulin  Hypertension: -Continue Imdur, lisinopril and metoprolol  Chronic ITP: -Monitor Platelet count  Psoriatic arthritis: -Continue with Plaquenil  CAD: -Continue aspirin, Lipitor,  metoprolol and Imdur.  History of CVA: -Continue with aspirin and Lipitor  CKD 3A: -Creatinine  baseline 1.3  Seizures: -Per daughter Keppra was discontinue from adverse effect, getting aggressive, agitated./  -Will discussed medication option  with Neurology  -discussed with Neurology, plan to start Depakote.    Estimated body mass index is 23 kg/m as calculated from the following:   Height as of 02/10/23: 5\' 11"  (1.803 m).   Weight as of this encounter: 74.8 kg.   DVT prophylaxis: Lovenox Code Status: DNR Family Communication: Daughter over phone Disposition Plan:  Status is: Inpatient Remains inpatient appropriate because: management of PNA    Consultants:  Neurology phone  Procedures:  none  Antimicrobials:    Subjective: She is alert, having tremors, movement of head and chest, she is alert, answer questions. Report tremors, movement for last 8 months.  Report cough   Objective: Vitals:   05/15/23 0010 05/15/23 0105 05/15/23 0113 05/15/23 0445  BP: (!) 175/70 (!) 178/79  (!) 164/80  Pulse: (!) 104 (!) 112  93  Resp: 14 18  20   Temp:   99.3 F (37.4 C) 98.6 F (37 C)  TempSrc:   Oral Oral  SpO2: 98% 96%  92%  Weight: 74.8 kg      No intake or output data in the 24 hours ending 05/15/23 0728 Filed Weights   05/15/23 0010  Weight: 74.8 kg    Examination:  General exam: Appears calm and comfortable  Respiratory system: Clear to auscultation. Respiratory effort normal. Cardiovascular system: S1 & S2 heard, RRR. No JVD, murmurs, rubs, gallops or clicks. No pedal edema. Gastrointestinal system: Abdomen is nondistended, soft and nontender. No organomegaly or masses felt. Normal bowel sounds heard. Central nervous system: Alert and oriented. No focal neurological deficits.  Extremities: Symmetric 5 x 5 power. Skin: No rashes, lesions or ulcers Psychiatry: Judgement and insight appear normal. Mood & affect appropriate.     Data Reviewed: I have  personally reviewed following labs and imaging studies  CBC: Recent Labs  Lab 05/14/23 2122 05/15/23 0527  WBC 10.9* 10.9*  NEUTROABS 8.5*  --   HGB 11.3* 10.3*  HCT 32.1* 29.5*  MCV 88.7 89.1  PLT 117* 109*   Basic Metabolic Panel: Recent Labs  Lab 05/14/23 2122 05/15/23 0527  NA 130* 131*  K 3.9 3.9  CL 103 104  CO2 19* 20*  GLUCOSE 353* 216*  BUN 24* 20  CREATININE 1.16* 1.09*  CALCIUM 8.6* 8.3*   GFR: Estimated Creatinine Clearance: 51.4 mL/min (A) (by C-G formula based on SCr of 1.09 mg/dL (H)). Liver Function Tests: Recent Labs  Lab 05/14/23 2122  AST 21  ALT 12  ALKPHOS 68  BILITOT 0.7  PROT 7.1  ALBUMIN 4.0   No results for input(s): "LIPASE", "AMYLASE" in the last 168 hours. No results for input(s): "AMMONIA" in the last 168 hours. Coagulation Profile: Recent Labs  Lab 05/14/23 2135  INR 1.1   Cardiac Enzymes: No results for input(s): "CKTOTAL", "CKMB", "CKMBINDEX", "TROPONINI" in the last 168 hours. BNP (last 3 results) No results for input(s): "PROBNP" in the last 8760 hours. HbA1C: No results for input(s): "HGBA1C" in the last 72 hours. CBG: Recent Labs  Lab 05/14/23 2103 05/14/23 2220 05/15/23 0224  GLUCAP 351* 302* 259*   Lipid Profile: No results for input(s): "CHOL", "HDL", "LDLCALC", "TRIG", "CHOLHDL", "LDLDIRECT" in the last 72 hours. Thyroid Function Tests: No results for input(s): "TSH", "T4TOTAL", "FREET4", "T3FREE", "THYROIDAB" in the last 72 hours. Anemia Panel: No results for input(s): "VITAMINB12", "FOLATE", "FERRITIN", "TIBC", "IRON", "RETICCTPCT" in the last 72 hours. Sepsis Labs: Recent Labs  Lab 05/14/23 2128  LATICACIDVEN 1.2    Recent Results (from the past 240 hours)  Resp panel by RT-PCR (RSV, Flu A&B, Covid) Anterior Nasal Swab     Status: None   Collection Time: 05/14/23 10:00 PM   Specimen: Anterior Nasal Swab  Result Value Ref Range Status   SARS Coronavirus 2 by RT PCR NEGATIVE NEGATIVE Final     Comment: (NOTE) SARS-CoV-2 target nucleic acids are NOT DETECTED.  The SARS-CoV-2 RNA is generally detectable in upper respiratory specimens during the acute phase of infection. The lowest concentration of SARS-CoV-2 viral copies this assay can detect is 138 copies/mL. A negative result does not preclude SARS-Cov-2 infection and should not be used as the sole basis for treatment or other patient management decisions. A negative result may occur with  improper specimen collection/handling, submission of specimen other than nasopharyngeal swab, presence of viral mutation(s) within the areas targeted by this assay, and inadequate number of viral copies(<138 copies/mL). A negative result must be combined with clinical observations, patient history, and epidemiological information. The expected result is Negative.  Fact Sheet for Patients:  BloggerCourse.com  Fact Sheet for Healthcare Providers:  SeriousBroker.it  This test is no t yet approved or cleared by the [place] FDA and  has been authorized for detection and/or diagnosis of SARS-CoV-2 by FDA under an Emergency Use Authorization (EUA). This EUA will remain  in effect (meaning this test can be used) for the duration of the COVID-19 declaration under Section 564(b)(1) of the Act, 21 U.S.C.section 360bbb-3(b)(1), unless the authorization is terminated  or revoked sooner.       Influenza A by PCR NEGATIVE NEGATIVE  Final   Influenza B by PCR NEGATIVE NEGATIVE Final    Comment: (NOTE) The Xpert Xpress SARS-CoV-2/FLU/RSV plus assay is intended as an aid in the diagnosis of influenza from Nasopharyngeal swab specimens and should not be used as a sole basis for treatment. Nasal washings and aspirates are unacceptable for Xpert Xpress SARS-CoV-2/FLU/RSV testing.  Fact Sheet for Patients: BloggerCourse.com  Fact Sheet for Healthcare  Providers: SeriousBroker.it  This test is not yet approved or cleared by the [place] FDA and has been authorized for detection and/or diagnosis of SARS-CoV-2 by FDA under an Emergency Use Authorization (EUA). This EUA will remain in effect (meaning this test can be used) for the duration of the COVID-19 declaration under Section 564(b)(1) of the Act, 21 U.S.C. section 360bbb-3(b)(1), unless the authorization is terminated or revoked.     Resp Syncytial Virus by PCR NEGATIVE NEGATIVE Final    Comment: (NOTE) Fact Sheet for Patients: BloggerCourse.com  Fact Sheet for Healthcare Providers: SeriousBroker.it  This test is not yet approved or cleared by the [place] FDA and has been authorized for detection and/or diagnosis of SARS-CoV-2 by FDA under an Emergency Use Authorization (EUA). This EUA will remain in effect (meaning this test can be used) for the duration of the COVID-19 declaration under Section 564(b)(1) of the Act, 21 U.S.C. section 360bbb-3(b)(1), unless the authorization is terminated or revoked.  Performed at [facility], [address], [place], [place] [address]          Radiology Studies: CT Angio Chest PE W and/or Wo Contrast Result Date: 05/15/2023 CLINICAL DATA:  Pulmonary embolus suspected with high probability. EXAM: CT ANGIOGRAPHY CHEST WITH CONTRAST TECHNIQUE: Multidetector CT imaging of the chest was performed using the standard protocol during bolus administration of intravenous contrast. Multiplanar CT image reconstructions and MIPs were obtained to evaluate the vascular anatomy. RADIATION DOSE REDUCTION: This exam was performed according to the departmental dose-optimization program which includes automated exposure control, adjustment of the mA and/or kV according to patient size and/or use of iterative reconstruction technique. CONTRAST:  75mL  OMNIPAQUE IOHEXOL 350 MG/ML SOLN COMPARISON:  Chest radiograph 05/14/2023.  CT chest 10/08/2017 FINDINGS: Cardiovascular: Technically adequate study with good opacification of the central and segmental pulmonary arteries. Minimal motion artifact. No focal filling defects are identified. No evidence of significant pulmonary embolus. Cardiac enlargement with prominence of the left atrium. No pericardial effusion. Normal caliber thoracic aorta. No dissection. Mediastinum/Nodes: Esophagus is decompressed. Scattered lymph nodes are not pathologically enlarged, likely reactive. Thyroid gland is unremarkable. Lungs/Pleura: Motion artifact limits evaluation of the lungs. There appears to be patchy infiltration in the lung bases and throughout the right lung. This could represent developing multifocal pneumonia. Slight fibrosis is suggested in the lung bases. No pleural effusion or pneumothorax. Upper Abdomen: Hepatic cirrhosis with enlarged lateral segment left lobe and nodular contour. Splenic enlargement. Musculoskeletal: No chest wall abnormality. No acute or significant osseous findings. Review of the MIP images confirms the above findings. IMPRESSION: 1. No evidence of significant pulmonary embolus. 2. Mild cardiac enlargement. 3. Patchy infiltrates in the lungs, greater on the right. This is nonspecific but may represent early multifocal pneumonia. 4. Hepatic cirrhosis with splenic enlargement. Electronically Signed   By: Burman Nieves M.D.   On: 05/15/2023 00:07   DG Chest Port 1 View Result Date: 05/14/2023 CLINICAL DATA:  Possible sepsis EXAM: PORTABLE CHEST 1 VIEW COMPARISON:  03/23/2022 FINDINGS: The heart size and mediastinal contours are within normal limits. Both lungs are  clear. The visualized skeletal structures are unremarkable. IMPRESSION: No active disease. Electronically Signed   By: Alcide Clever M.D.   On: 05/14/2023 21:33        Scheduled Meds:  amLODipine  10 mg Oral Daily   aspirin EC   81 mg Oral Daily   atorvastatin  5 mg Oral QHS   azithromycin  500 mg Oral Daily   enoxaparin (LOVENOX) injection  40 mg Subcutaneous Q24H   gabapentin  200 mg Oral BID   hydroxychloroquine  200 mg Oral Daily   insulin aspart  0-5 Units Subcutaneous QHS   insulin aspart  0-9 Units Subcutaneous TID WC   insulin glargine  15 Units Subcutaneous QHS   isosorbide mononitrate  30 mg Oral Daily   linaclotide  145 mcg Oral Daily   lisinopril  10 mg Oral Daily   metoprolol tartrate  12.5 mg Oral BID   sertraline  75 mg Oral Daily   Continuous Infusions:  [START ON 05/16/2023] cefTRIAXone (ROCEPHIN)  IV       LOS: 0 days    Time spent: 35 minutes    Kyrielle Urbanski A Esterlene Atiyeh, MD [facility]   If 7PM-7AM, please contact night-coverage [email or web address]  05/15/2023, 7:28 AM

## 2023-05-16 ENCOUNTER — Inpatient Hospital Stay (HOSPITAL_COMMUNITY)

## 2023-05-16 DIAGNOSIS — J189 Pneumonia, unspecified organism: Secondary | ICD-10-CM | POA: Diagnosis not present

## 2023-05-16 LAB — CBC
HCT: 32.1 % — ABNORMAL LOW (ref 36.0–46.0)
Hemoglobin: 10.7 g/dL — ABNORMAL LOW (ref 12.0–15.0)
MCH: 30.1 pg (ref 26.0–34.0)
MCHC: 33.3 g/dL (ref 30.0–36.0)
MCV: 90.2 fL (ref 80.0–100.0)
Platelets: 137 10*3/uL — ABNORMAL LOW (ref 150–400)
RBC: 3.56 MIL/uL — ABNORMAL LOW (ref 3.87–5.11)
RDW: 13.8 % (ref 11.5–15.5)
WBC: 12.5 10*3/uL — ABNORMAL HIGH (ref 4.0–10.5)
nRBC: 0 % (ref 0.0–0.2)

## 2023-05-16 LAB — BASIC METABOLIC PANEL
Anion gap: 11 (ref 5–15)
BUN: 26 mg/dL — ABNORMAL HIGH (ref 8–23)
CO2: 18 mmol/L — ABNORMAL LOW (ref 22–32)
Calcium: 8.4 mg/dL — ABNORMAL LOW (ref 8.9–10.3)
Chloride: 106 mmol/L (ref 98–111)
Creatinine, Ser: 1.11 mg/dL — ABNORMAL HIGH (ref 0.44–1.00)
GFR, Estimated: 52 mL/min — ABNORMAL LOW (ref >60)
Glucose, Bld: 108 mg/dL — ABNORMAL HIGH (ref 70–99)
Potassium: 4.2 mmol/L (ref 3.5–5.1)
Sodium: 135 mmol/L (ref 135–145)

## 2023-05-16 LAB — GLUCOSE, CAPILLARY
Glucose-Capillary: 98 mg/dL (ref 70–99)
Glucose-Capillary: 130 mg/dL — ABNORMAL HIGH (ref 70–99)
Glucose-Capillary: 109 mg/dL — ABNORMAL HIGH (ref 70–99)
Glucose-Capillary: 93 mg/dL (ref 70–99)

## 2023-05-16 LAB — VALPROIC ACID LEVEL: Valproic Acid Lvl: 65 ug/mL (ref 50.0–100.0)

## 2023-05-16 LAB — LEGIONELLA PNEUMOPHILA SEROGP 1 UR AG: L. pneumophila Serogp 1 Ur Ag: NEGATIVE

## 2023-05-16 MED ORDER — PANTOPRAZOLE SODIUM 40 MG PO TBEC
40.0000 mg | DELAYED_RELEASE_TABLET | Freq: Every day | ORAL | Status: DC
Start: 2023-05-16 — End: 2023-05-24
  Administered 2023-05-16 – 2023-05-24 (×9): 40 mg via ORAL
  Filled 2023-05-16 (×9): qty 1

## 2023-05-16 MED ORDER — ONDANSETRON HCL 4 MG/2ML IJ SOLN
4.0000 mg | Freq: Four times a day (QID) | INTRAMUSCULAR | Status: DC | PRN
  Administered 2023-05-16 – 2023-05-22 (×5): 4 mg via INTRAVENOUS
  Filled 2023-05-16 (×5): qty 2

## 2023-05-16 MED ORDER — ONDANSETRON HCL 4 MG/2ML IJ SOLN
4.0000 mg | Freq: Once | INTRAMUSCULAR | Status: AC
  Administered 2023-05-16: 4 mg via INTRAVENOUS
  Filled 2023-05-16: qty 2

## 2023-05-16 MED ORDER — MORPHINE SULFATE (PF) 2 MG/ML IV SOLN
1.0000 mg | Freq: Once | INTRAVENOUS | Status: AC
  Administered 2023-05-16: 1 mg via INTRAVENOUS
  Filled 2023-05-16: qty 1

## 2023-05-16 MED ORDER — DIPHENHYDRAMINE HCL 25 MG PO CAPS
25.0000 mg | ORAL_CAPSULE | Freq: Once | ORAL | Status: AC | PRN
  Administered 2023-05-17: 25 mg via ORAL
  Filled 2023-05-16: qty 1

## 2023-05-16 NOTE — Progress Notes (Signed)
 PROGRESS NOTE    Olivia Burnett  ZOX:[id number] DOB: 1951-01-10 DOA: 05/14/2023 PCP: System, Provider Not In   Brief Narrative: 73 year old with past medical history significant for hypertension, insulin-dependent diabetes, CAD, chronic ITP, psoriatic arthritis, CVA with right-sided weakness, presented with dysuria, productive cough, shortness of breath, fevers and pleuritic chest pain.  Patient presented with mild tachycardia, elevated blood pressure, hyperglycemia CBG 353, white blood cell 10,000, CT chest negative for PE but concerning for multifocal pneumonia.  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 -CTA chest: Negative for PE .patchy infiltrates in the lungs, greater on the right. This is nonspecific but may represent early multifocal pneumonia. -Respiratory panel negative for influenza, COVID PCR negative -Follow blood cultures: No growth.  -Continue IV Ceftriaxone and oral azithromycin.  -Speech for swallow evaluation  2-UTI: -Presents  with dysuria, UA with more than 50 white blood cells. -Follow urine culture: Gram negative rods.  Hyponatremia: -Continue with IV fluids.   Tremor head, arms:  -For last 8 months.  -Needs follow up with neurology   Type 2 diabetes, hyperglycemia uncontrolled -Continue Lantus and sliding scale insulin  Hypertension: -Continue Imdur, lisinopril and metoprolol  Chronic ITP: -Monitor Platelet count  Psoriatic arthritis: -Continue with  Plaquenil  CAD: -Continue aspirin, Lipitor, metoprolol and Imdur.  History of CVA: -Continue with aspirin and Lipitor  CKD 3A: -Creatinine  baseline 1.3  Seizures: -Per daughter Keppra was discontinue from adverse effect, getting aggressive, agitated./  -discussed with Neurology, Patient started on  Depakote.    Estimated body mass index is 26.56 kg/m as calculated from the following:   Height as of this encounter: 5\' 5"  (1.651 m).   Weight as of this encounter: 72.4 kg.   DVT prophylaxis: Lovenox Code Status: DNR Family Communication: Daughter over phone 3/22 Disposition Plan:  Status is: Inpatient Remains inpatient appropriate because: management of PNA    Consultants:  Neurology phone  Procedures:  none  Antimicrobials:    Subjective: She is more awake today. Not feeling well. Report nausea.  Breathing ok, no worsening cough    Objective: Vitals:   05/16/23 0017 05/16/23 0432 05/16/23 0907 05/16/23 0957  BP: 136/64 (!) 152/61 (!) 152/61 (!) 158/63  Pulse: 76 81  87  Resp: 16 16  16   Temp: 98.9 F (37.2 C) 98.7 F (37.1 C)  99.6 F (37.6 C)  TempSrc: Oral Oral  Oral  SpO2: 100% 97%  100%  Weight:      Height:        Intake/Output Summary (Last 24 hours) at 05/16/2023 1350 Last data filed at 05/16/2023 1000 Gross per 24 hour  Intake 511.55 ml  Output 600 ml  Net -88.45 ml   Filed Weights   05/15/23 0010 05/15/23 1100  Weight: 74.8 kg 72.4 kg    Examination:  General exam: NAD Respiratory system: CTA Cardiovascular system: S 1, S 2 RRR Gastrointestinal system: BS present, soft nt Central nervous system: Non focal. Movement of head and neck Extremities: no edema    Data  Reviewed: I have personally reviewed following labs and imaging studies  CBC: Recent Labs  Lab 05/14/23 2122 05/15/23 0527 05/16/23 0513  WBC 10.9* 10.9* 12.5*  NEUTROABS 8.5*  --   --   HGB 11.3* 10.3* 10.7*  HCT 32.1* 29.5* 32.1*  MCV 88.7 89.1 90.2  PLT  117* 109* 137*   Basic Metabolic Panel: Recent Labs  Lab 05/14/23 2122 05/15/23 0527 05/16/23 0513  NA 130* 131* 135  K 3.9 3.9 4.2  CL 103 104 106  CO2 19* 20* 18*  GLUCOSE 353* 216* 108*  BUN 24* 20 26*  CREATININE 1.16* 1.09* 1.11*  CALCIUM 8.6* 8.3* 8.4*   GFR: Estimated Creatinine Clearance: 45 mL/min (A) (by C-G formula based on SCr of 1.11 mg/dL (H)). Liver Function Tests: Recent Labs  Lab 05/14/23 2122  AST 21  ALT 12  ALKPHOS 68  BILITOT 0.7  PROT 7.1  ALBUMIN 4.0   No results for input(s): "LIPASE", "AMYLASE" in the last 168 hours. Recent Labs  Lab 05/15/23 1536  AMMONIA 23   Coagulation Profile: Recent Labs  Lab 05/14/23 2135  INR 1.1   Cardiac Enzymes: No results for input(s): "CKTOTAL", "CKMB", "CKMBINDEX", "TROPONINI" in the last 168 hours. BNP (last 3 results) No results for input(s): "PROBNP" in the last 8760 hours. HbA1C: No results for input(s): "HGBA1C" in the last 72 hours. CBG: Recent Labs  Lab 05/15/23 1059 05/15/23 1650 05/15/23 2024 05/16/23 0738 05/16/23 1139  GLUCAP 148* 145* 127* 98 130*   Lipid Profile: No results for input(s): "CHOL", "HDL", "LDLCALC", "TRIG", "CHOLHDL", "LDLDIRECT" in the last 72 hours. Thyroid Function Tests: No results for input(s): "TSH", "T4TOTAL", "FREET4", "T3FREE", "THYROIDAB" in the last 72 hours. Anemia Panel: No results for input(s): "VITAMINB12", "FOLATE", "FERRITIN", "TIBC", "IRON", "RETICCTPCT" in the last 72 hours. Sepsis Labs: Recent Labs  Lab 05/14/23 2128  LATICACIDVEN 1.2    Recent Results (from the past 240 hours)  Blood Culture (routine x 2)     Status: None (Preliminary result)   Collection Time: 05/14/23  9:22 PM   Specimen: BLOOD  Result Value Ref Range Status   Specimen Description   Final    BLOOD LEFT ANTECUBITAL Performed at [facility], [address], [place], [place] [address]    Special Requests   Final    BOTTLES DRAWN AEROBIC AND  ANAEROBIC Blood Culture results may not be optimal due to an inadequate volume of blood received in culture bottles Performed at [facility], [address] [place], [place] [address]    Culture   Final    NO GROWTH 1 DAY Performed at [facility], [address] [place], [place] [address]    Report Status PENDING  Incomplete  Blood Culture (routine x 2)     Status: None (Preliminary result)   Collection Time: 05/14/23  9:50 PM   Specimen: BLOOD RIGHT ARM  Result Value Ref Range Status   Specimen Description   Final    BLOOD RIGHT ARM Performed at [facility], [address], [place], [place] [address]    Special Requests   Final    BOTTLES DRAWN AEROBIC AND ANAEROBIC Blood Culture results may not be optimal due to an inadequate volume of blood received in culture bottles Performed at [facility], [address] [place], [place] [address]    Culture   Final    NO GROWTH 1 DAY Performed at [facility], [address] [place], [place] [address]    Report Status PENDING  Incomplete  Resp panel by RT-PCR (RSV, Flu A&B, Covid) Anterior Nasal Swab     Status: None   Collection Time: 05/14/23 10:00 PM   Specimen: Anterior Nasal Swab  Result Value Ref Range Status   SARS Coronavirus 2 by RT PCR NEGATIVE NEGATIVE Final    Comment: (NOTE) SARS-CoV-2 target nucleic acids are NOT DETECTED.  The SARS-CoV-2 RNA is generally detectable in upper respiratory specimens during the acute phase of infection. The lowest concentration of SARS-CoV-2 viral copies this assay can detect is 138 copies/mL. A negative result does not preclude SARS-Cov-2 infection and should not be used as the sole basis for treatment or other patient management decisions. A negative result may occur with  improper specimen collection/handling, submission of specimen other than nasopharyngeal swab, presence of viral mutation(s) within the areas  targeted by this assay, and inadequate number of viral copies(<138 copies/mL). A negative result must be combined with clinical observations, patient history, and epidemiological information. The expected result is Negative.  Fact Sheet for Patients:  BloggerCourse.com  Fact Sheet for Healthcare Providers:  SeriousBroker.it  This test is no t yet approved or cleared by the [place] FDA and  has been authorized for detection and/or diagnosis of SARS-CoV-2 by FDA under an Emergency Use Authorization (EUA). This EUA will remain  in effect (meaning this test can be used) for the duration of the COVID-19 declaration under Section 564(b)(1) of the Act, 21 U.S.C.section 360bbb-3(b)(1), unless the authorization is terminated  or revoked sooner.       Influenza A by PCR NEGATIVE NEGATIVE Final   Influenza B by PCR NEGATIVE NEGATIVE Final    Comment: (NOTE) The Xpert Xpress SARS-CoV-2/FLU/RSV plus assay is intended as an aid in the diagnosis of influenza from Nasopharyngeal swab specimens and should not be used as a sole basis for treatment. Nasal washings and aspirates are unacceptable for Xpert Xpress SARS-CoV-2/FLU/RSV testing.  Fact Sheet for Patients: BloggerCourse.com  Fact Sheet for Healthcare Providers: SeriousBroker.it  This test is not yet approved or cleared by the [place] FDA and has been authorized for detection and/or diagnosis of SARS-CoV-2 by FDA under an Emergency Use Authorization (EUA). This EUA will remain in effect (meaning this test can be used) for the duration of the COVID-19 declaration under Section 564(b)(1) of the Act, 21 U.S.C. section 360bbb-3(b)(1), unless the authorization is terminated or revoked.     Resp Syncytial Virus by PCR NEGATIVE NEGATIVE Final    Comment: (NOTE) Fact Sheet for  Patients: BloggerCourse.com  Fact Sheet for Healthcare Providers: SeriousBroker.it  This test is not yet approved or cleared by the [place] FDA and has been authorized for detection and/or diagnosis of SARS-CoV-2 by FDA under an Emergency Use Authorization (EUA). This EUA will remain in effect (meaning this test can be used) for the duration of the COVID-19 declaration under Section 564(b)(1) of the Act, 21 U.S.C. section 360bbb-3(b)(1), unless the authorization is terminated or revoked.  Performed at [facility] [address] [place], [place] [address]   Urine Culture     Status: Abnormal (Preliminary result)   Collection Time: 05/15/23 12:29 AM   Specimen: Urine, Random  Result Value Ref Range Status   Specimen Description   Final    URINE, RANDOM Performed at [facility], [address] [place], [place] [address]    Special Requests   Final    NONE  Reflexed from [phone] Performed at [facility], [address], [place], [place] [address]    Culture (A)  Final    >=100,000 COLONIES/mL GRAM NEGATIVE RODS IDENTIFICATION TO FOLLOW Performed at [facility], [address], [place], [place] [address]    Report Status PENDING  Incomplete  MRSA Next Gen by PCR, Nasal     Status: None   Collection Time: 05/15/23  5:40 PM   Specimen: Nasal Mucosa; Nasal Swab  Result Value Ref Range Status   MRSA by PCR Next Gen NOT DETECTED NOT DETECTED Final    Comment: (NOTE) The GeneXpert MRSA Assay (FDA approved for NASAL specimens only), is one component of a comprehensive MRSA colonization surveillance program. It is not intended to diagnose MRSA infection nor to guide or monitor treatment for MRSA infections. Test performance is not FDA approved in patients less than 62 years old. Performed at [facility], [address], [place], [place]  [address]          Radiology Studies: CT Angio Chest PE W and/or Wo Contrast Result Date: 05/15/2023 CLINICAL DATA:  Pulmonary embolus suspected with high probability. EXAM: CT ANGIOGRAPHY CHEST WITH CONTRAST TECHNIQUE: Multidetector CT imaging of the chest was performed using the standard protocol during bolus administration of intravenous contrast. Multiplanar CT image reconstructions and MIPs were obtained to evaluate the vascular anatomy. RADIATION DOSE REDUCTION: This exam was performed according to the departmental dose-optimization program which includes automated exposure control, adjustment of the mA and/or kV according to patient size and/or use of iterative reconstruction technique. CONTRAST:  75mL OMNIPAQUE IOHEXOL 350 MG/ML SOLN COMPARISON:  Chest radiograph 05/14/2023.  CT chest 10/08/2017 FINDINGS: Cardiovascular: Technically adequate study with good opacification of the central and segmental pulmonary arteries. Minimal motion artifact. No focal filling defects are identified. No evidence of significant pulmonary embolus. Cardiac enlargement with prominence of the left atrium. No pericardial effusion. Normal caliber thoracic aorta. No dissection. Mediastinum/Nodes: Esophagus is decompressed. Scattered lymph nodes are not pathologically enlarged, likely reactive. Thyroid gland is unremarkable. Lungs/Pleura: Motion artifact limits evaluation of the lungs. There appears to be patchy infiltration in the lung bases and throughout the right lung. This could represent developing multifocal pneumonia. Slight fibrosis is suggested in the lung bases. No pleural effusion or pneumothorax. Upper Abdomen: Hepatic cirrhosis with enlarged lateral segment left lobe and nodular contour. Splenic enlargement. Musculoskeletal: No chest wall abnormality. No acute or significant osseous findings. Review of the MIP images confirms the above findings. IMPRESSION: 1. No evidence of significant pulmonary embolus. 2. Mild  cardiac enlargement. 3. Patchy infiltrates in the lungs, greater on the right. This is nonspecific but may represent early multifocal pneumonia. 4. Hepatic cirrhosis with splenic enlargement. Electronically Signed   By: Burman Nieves M.D.   On: 05/15/2023 00:07   DG Chest Port 1 View Result Date: 05/14/2023 CLINICAL DATA:  Possible sepsis EXAM: PORTABLE CHEST 1 VIEW COMPARISON:  03/23/2022 FINDINGS: The heart size and mediastinal contours are within normal limits. Both lungs are clear. The visualized skeletal structures are unremarkable. IMPRESSION: No active disease. Electronically Signed   By: Alcide Clever M.D.   On: 05/14/2023 21:33        Scheduled Meds:  amLODipine  10 mg Oral Daily   aspirin EC  81 mg Oral Daily   atorvastatin  5 mg Oral QHS   azithromycin  500 mg Oral Daily   enoxaparin (LOVENOX) injection  40 mg Subcutaneous Q24H   gabapentin  200 mg Oral BID   hydroxychloroquine  200 mg Oral Daily   insulin aspart  0-5 Units Subcutaneous QHS   insulin aspart  0-9 Units Subcutaneous TID WC   insulin glargine  15 Units Subcutaneous QHS   isosorbide mononitrate  30 mg Oral Daily   linaclotide  145 mcg Oral Daily   lisinopril  10 mg Oral Daily   metoprolol tartrate  12.5 mg Oral BID   sertraline  75 mg Oral Daily   valproic acid  500 mg Oral TID   Continuous Infusions:  cefTRIAXone (ROCEPHIN)  IV Stopped (05/16/23 0046)     LOS: 1 day    Time spent: 35 minutes    Jazper Nikolai A Dat Derksen, MD [facility]   If 7PM-7AM, please contact night-coverage [email or web address]  05/16/2023, 1:50 PM

## 2023-05-16 NOTE — Plan of Care (Signed)
  Problem: Education: Goal: Ability to describe self-care measures that may prevent or decrease complications (Diabetes Survival Skills Education) will improve Outcome: Progressing Goal: Individualized Educational Video(s) Outcome: Progressing   Problem: Skin Integrity: Goal: Risk for impaired skin integrity will decrease Outcome: Progressing   Problem: Nutrition: Goal: Adequate nutrition will be maintained Outcome: Progressing

## 2023-05-16 NOTE — Plan of Care (Signed)

## 2023-05-17 ENCOUNTER — Inpatient Hospital Stay (HOSPITAL_COMMUNITY)

## 2023-05-17 DIAGNOSIS — J189 Pneumonia, unspecified organism: Secondary | ICD-10-CM | POA: Diagnosis not present

## 2023-05-17 LAB — URINALYSIS, ROUTINE W REFLEX MICROSCOPIC
Bilirubin Urine: NEGATIVE
Glucose, UA: NEGATIVE mg/dL
Ketones, ur: NEGATIVE mg/dL
Nitrite: NEGATIVE
Protein, ur: 30 mg/dL — AB
Specific Gravity, Urine: 1.013 (ref 1.005–1.030)
WBC, UA: >50 WBC/hpf (ref 0–5)
pH: 5 (ref 5.0–8.0)

## 2023-05-17 LAB — CBC
HCT: 28.6 % — ABNORMAL LOW (ref 36.0–46.0)
Hemoglobin: 9.5 g/dL — ABNORMAL LOW (ref 12.0–15.0)
MCH: 30.6 pg (ref 26.0–34.0)
MCHC: 33.2 g/dL (ref 30.0–36.0)
MCV: 92.3 fL (ref 80.0–100.0)
Platelets: 142 10*3/uL — ABNORMAL LOW (ref 150–400)
RBC: 3.1 MIL/uL — ABNORMAL LOW (ref 3.87–5.11)
RDW: 14.1 % (ref 11.5–15.5)
WBC: 10.8 10*3/uL — ABNORMAL HIGH (ref 4.0–10.5)
nRBC: 0 % (ref 0.0–0.2)

## 2023-05-17 LAB — BASIC METABOLIC PANEL
Anion gap: 12 (ref 5–15)
BUN: 39 mg/dL — ABNORMAL HIGH (ref 8–23)
CO2: 17 mmol/L — ABNORMAL LOW (ref 22–32)
Calcium: 8.2 mg/dL — ABNORMAL LOW (ref 8.9–10.3)
Chloride: 105 mmol/L (ref 98–111)
Creatinine, Ser: 2.03 mg/dL — ABNORMAL HIGH (ref 0.44–1.00)
GFR, Estimated: 25 mL/min — ABNORMAL LOW (ref >60)
Glucose, Bld: 101 mg/dL — ABNORMAL HIGH (ref 70–99)
Potassium: 4.4 mmol/L (ref 3.5–5.1)
Sodium: 134 mmol/L — ABNORMAL LOW (ref 135–145)

## 2023-05-17 LAB — GLUCOSE, CAPILLARY
Glucose-Capillary: 102 mg/dL — ABNORMAL HIGH (ref 70–99)
Glucose-Capillary: 114 mg/dL — ABNORMAL HIGH (ref 70–99)
Glucose-Capillary: 111 mg/dL — ABNORMAL HIGH (ref 70–99)
Glucose-Capillary: 166 mg/dL — ABNORMAL HIGH (ref 70–99)

## 2023-05-17 LAB — URINE CULTURE: Culture: >=100000 — AB

## 2023-05-17 MED ORDER — ENOXAPARIN SODIUM 30 MG/0.3ML IJ SOSY
30.0000 mg | PREFILLED_SYRINGE | INTRAMUSCULAR | Status: DC
  Administered 2023-05-17 – 2023-05-18 (×2): 30 mg via SUBCUTANEOUS
  Filled 2023-05-17 (×2): qty 0.3

## 2023-05-17 MED ORDER — LACTATED RINGERS IV SOLN: INTRAVENOUS | Status: DC

## 2023-05-17 MED ORDER — SODIUM CHLORIDE 0.9 % IV BOLUS
500.0000 mL | Freq: Once | INTRAVENOUS | Status: AC
  Administered 2023-05-17: 500 mL via INTRAVENOUS

## 2023-05-17 MED ORDER — SODIUM CHLORIDE 0.9 % IV SOLN
500.0000 mg | Freq: Two times a day (BID) | INTRAVENOUS | Status: DC
  Administered 2023-05-17 – 2023-05-18 (×3): 500 mg via INTRAVENOUS
  Filled 2023-05-17: qty 500
  Filled 2023-05-17 (×3): qty 10

## 2023-05-17 NOTE — Progress Notes (Signed)
 PROGRESS NOTE    Olivia Burnett  AOZ:[id number] DOB: June 13, 1950 DOA: 05/14/2023 PCP: System, Provider Not In   Brief Narrative: 73 year old with past medical history significant for hypertension, insulin-dependent diabetes, CAD, chronic ITP, psoriatic arthritis, CVA with right-sided weakness, presented with dysuria, productive cough, shortness of breath, fevers and pleuritic chest pain.  Patient presented with mild tachycardia, elevated blood pressure, hyperglycemia CBG 353, white blood cell 10,000, CT chest negative for PE but concerning for multifocal pneumonia.  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 -CTA chest: Negative for PE .patchy infiltrates in the lungs, greater on the right. This is nonspecific but may represent early multifocal pneumonia. -Respiratory panel negative for influenza, COVID PCR negative -Follow blood cultures: No growth.  -Continue Ioral azithromycin. Ceftriaxone change to Cefepime to cover for Klebsiella ESBL.  -Speech for swallow evaluation--no evidence of aspiration.   2-UTI: -Presents  with dysuria, UA with more than 50 white blood cells. -Follow urine culture: Klebsiella Pneumoniae, ESBL.  -Ceftriaxone change to Meropenem.   AKI CKD 3A: -Creatinine  baseline 1.3 Cr increase to 2.  Plan to check renal US.  Continue with IV fluids.  Strict I and O.   Hyponatremia: -Continue with IV fluids.   Tremor head, arms:  -For last 8 months.  -Needs follow up with neurology    Type 2 diabetes, hyperglycemia uncontrolled -Continue Lantus and sliding scale insulin  Hypertension: -Continue Imdur, lisinopril and metoprolol  Chronic ITP: -Monitor Platelet count  Psoriatic arthritis: -Continue with Plaquenil  CAD: -Continue aspirin, Lipitor, metoprolol and Imdur.  History of CVA: -Continue with aspirin and Lipitor   Seizures: -Per daughter Keppra was discontinue from adverse effect, getting aggressive, agitated./  -discussed with Neurology, Patient started on  Depakote.   Headaches; resolved. CT head only showed old stroke.    Estimated body mass index is 26.56 kg/m as calculated from the following:   Height as of this encounter: 5\' 5"  (1.651 m).   Weight as of this encounter: 72.4 kg.   DVT prophylaxis: Lovenox Code Status: DNR Family Communication: Daughter over phone 3/22 Disposition Plan:  Status is: Inpatient Remains inpatient appropriate because: management of PNA    Consultants:  Neurology phone  Procedures:  none  Antimicrobials:    Subjective: She is still having some nausea. Denies headaches today.   Objective: Vitals:   05/16/23 0957 05/16/23 1356 05/16/23 2105 05/17/23 0553  BP: (!) 158/63 (!) 116/56 (!) 125/48 (!) 126/50  Pulse: 87 74 74 72  Resp: 16 16 15 15   Temp: 99.6 F (37.6 C) 99.3 F (37.4 C) 99.1 F (37.3 C) 98.9 F (37.2 C)  TempSrc: Oral Oral Oral Oral  SpO2: 100% 98% 100% 99%  Weight:      Height:        Intake/Output Summary (Last 24 hours) at 05/17/2023 1325 Last data filed at 05/17/2023 0940 Gross per 24 hour  Intake 1660 ml  Output 1400 ml  Net 260 ml   Filed Weights   05/15/23 0010 05/15/23 1100  Weight:  74.8 kg 72.4 kg    Examination:  General exam: NAD Respiratory system: CTA Cardiovascular system: S 1, S  2 RRR Gastrointestinal system: BS present, soft, nt Central nervous system: alert, less movement of head.  Extremities: no edema    Data Reviewed: I have personally  reviewed following labs and imaging studies  CBC: Recent Labs  Lab 05/14/23 2122 05/15/23 0527 05/16/23 0513 05/17/23 0435  WBC 10.9* 10.9* 12.5* 10.8*  NEUTROABS 8.5*  --   --   --   HGB 11.3* 10.3* 10.7* 9.5*  HCT 32.1* 29.5* 32.1* 28.6*  MCV 88.7 89.1 90.2 92.3  PLT 117* 109* 137* 142*   Basic Metabolic Panel: Recent Labs  Lab 05/14/23 2122 05/15/23 0527 05/16/23 0513 05/17/23 0435  NA 130* 131* 135 134*  K 3.9 3.9 4.2 4.4  CL 103 104 106 105  CO2 19* 20* 18* 17*  GLUCOSE 353* 216* 108* 101*  BUN 24* 20 26* 39*  CREATININE 1.16* 1.09* 1.11* 2.03*  CALCIUM 8.6* 8.3* 8.4* 8.2*   GFR: Estimated Creatinine Clearance: 24.6 mL/min (A) (by C-G formula based on SCr of 2.03 mg/dL (H)). Liver Function Tests: Recent Labs  Lab 05/14/23 2122  AST 21  ALT 12  ALKPHOS 68  BILITOT 0.7  PROT 7.1  ALBUMIN 4.0   No results for input(s): "LIPASE", "AMYLASE" in the last 168 hours. Recent Labs  Lab 05/15/23 1536  AMMONIA 23   Coagulation Profile: Recent Labs  Lab 05/14/23 2135  INR 1.1   Cardiac Enzymes: No results for input(s): "CKTOTAL", "CKMB", "CKMBINDEX", "TROPONINI" in the last 168 hours. BNP (last 3 results) No results for input(s): "PROBNP" in the last 8760 hours. HbA1C: No results for input(s): "HGBA1C" in the last 72 hours. CBG: Recent Labs  Lab 05/16/23 1139 05/16/23 1635 05/16/23 2102 05/17/23 0708 05/17/23 1132  GLUCAP 130* 109* 93 102* 114*   Lipid Profile: No results for input(s): "CHOL", "HDL", "LDLCALC", "TRIG", "CHOLHDL", "LDLDIRECT" in the last 72 hours. Thyroid Function Tests: No results for input(s): "TSH", "T4TOTAL", "FREET4", "T3FREE", "THYROIDAB" in the last 72 hours. Anemia Panel: No results for input(s): "VITAMINB12", "FOLATE", "FERRITIN", "TIBC", "IRON", "RETICCTPCT" in the last 72 hours. Sepsis Labs: Recent Labs  Lab 05/14/23 2128  LATICACIDVEN 1.2    Recent Results (from the past 240 hours)  Blood Culture (routine x 2)      Status: None (Preliminary result)   Collection Time: 05/14/23  9:22 PM   Specimen: BLOOD  Result Value Ref Range Status   Specimen Description   Final    BLOOD LEFT ANTECUBITAL Performed at [facility], [address], [place], [place] [address]    Special Requests   Final    BOTTLES DRAWN AEROBIC AND ANAEROBIC Blood Culture results may not be optimal due to an inadequate volume of blood received in culture bottles Performed at [facility], [address], [place], [place] [address]    Culture   Final    NO GROWTH 2 DAYS Performed at [facility], [address] [place], [place] [address]    Report Status PENDING  Incomplete  Blood Culture (routine x 2)     Status: None (Preliminary result)   Collection Time: 05/14/23  9:50 PM   Specimen: BLOOD RIGHT ARM  Result Value Ref Range Status   Specimen Description   Final    BLOOD RIGHT ARM Performed at [facility], [address] [place], [place] [address]  Special Requests   Final    BOTTLES DRAWN AEROBIC AND ANAEROBIC Blood Culture results may not be optimal due to an inadequate volume of blood received in culture bottles Performed at [facility], [address], [place], [place] [address]    Culture   Final    NO GROWTH 2 DAYS Performed at [facility], [address] [place], [place] [address]    Report Status PENDING  Incomplete  Resp panel by RT-PCR (RSV, Flu A&B, Covid) Anterior Nasal Swab     Status: None   Collection Time: 05/14/23 10:00 PM   Specimen: Anterior Nasal Swab  Result Value Ref Range Status   SARS Coronavirus 2 by RT PCR NEGATIVE NEGATIVE Final    Comment: (NOTE) SARS-CoV-2 target nucleic acids are NOT DETECTED.  The SARS-CoV-2 RNA is generally detectable in upper respiratory specimens during the acute phase of infection. The lowest concentration of SARS-CoV-2 viral copies this assay can detect is 138  copies/mL. A negative result does not preclude SARS-Cov-2 infection and should not be used as the sole basis for treatment or other patient management decisions. A negative result may occur with  improper specimen collection/handling, submission of specimen other than nasopharyngeal swab, presence of viral mutation(s) within the areas targeted by this assay, and inadequate number of viral copies(<138 copies/mL). A negative result must be combined with clinical observations, patient history, and epidemiological information. The expected result is Negative.  Fact Sheet for Patients:  BloggerCourse.com  Fact Sheet for Healthcare Providers:  SeriousBroker.it  This test is no t yet approved or cleared by the [place] FDA and  has been authorized for detection and/or diagnosis of SARS-CoV-2 by FDA under an Emergency Use Authorization (EUA). This EUA will remain  in effect (meaning this test can be used) for the duration of the COVID-19 declaration under Section 564(b)(1) of the Act, 21 U.S.C.section 360bbb-3(b)(1), unless the authorization is terminated  or revoked sooner.       Influenza A by PCR NEGATIVE NEGATIVE Final   Influenza B by PCR NEGATIVE NEGATIVE Final    Comment: (NOTE) The Xpert Xpress SARS-CoV-2/FLU/RSV plus assay is intended as an aid in the diagnosis of influenza from Nasopharyngeal swab specimens and should not be used as a sole basis for treatment. Nasal washings and aspirates are unacceptable for Xpert Xpress SARS-CoV-2/FLU/RSV testing.  Fact Sheet for Patients: BloggerCourse.com  Fact Sheet for Healthcare Providers: SeriousBroker.it  This test is not yet approved or cleared by the [place] FDA and has been authorized for detection and/or diagnosis of SARS-CoV-2 by FDA under an Emergency Use Authorization (EUA). This EUA will remain in effect (meaning  this test can be used) for the duration of the COVID-19 declaration under Section 564(b)(1) of the Act, 21 U.S.C. section 360bbb-3(b)(1), unless the authorization is terminated or revoked.     Resp Syncytial Virus by PCR NEGATIVE NEGATIVE Final    Comment: (NOTE) Fact Sheet for Patients: BloggerCourse.com  Fact Sheet for Healthcare Providers: SeriousBroker.it  This test is not yet approved or cleared by the [place] FDA and has been authorized for detection and/or diagnosis of SARS-CoV-2 by FDA under an Emergency Use Authorization (EUA). This EUA will remain in effect (meaning this test can be used) for the duration of the COVID-19 declaration under Section 564(b)(1) of the Act, 21 U.S.C. section 360bbb-3(b)(1), unless the authorization is terminated or revoked.  Performed at [facility], [address], [place], [place] [address]  Urine Culture     Status: Abnormal   Collection Time: 05/15/23 12:29 AM   Specimen: Urine, Random  Result Value Ref Range Status   Specimen Description   Final    URINE, RANDOM Performed at [facility], [address], [place], [place] [address]    Special Requests   Final    NONE Reflexed from [id number] Performed at [facility], [address], [place], [place] [address]    Culture (A)  Final    >=100,000 COLONIES/mL KLEBSIELLA PNEUMONIAE Confirmed Extended Spectrum Beta-Lactamase Producer (ESBL).  In bloodstream infections from ESBL organisms, carbapenems are preferred over piperacillin/tazobactam. They are shown to have a lower risk of mortality.    Report Status 05/17/2023 FINAL  Final   Organism ID, Bacteria KLEBSIELLA PNEUMONIAE (A)  Final      Susceptibility   Klebsiella pneumoniae - MIC*    AMPICILLIN >=32 RESISTANT Resistant     CEFAZOLIN >=64 RESISTANT Resistant     CEFEPIME >=32 RESISTANT Resistant     CEFTRIAXONE >=64  RESISTANT Resistant     CIPROFLOXACIN <=0.25 SENSITIVE Sensitive     GENTAMICIN <=1 SENSITIVE Sensitive     IMIPENEM <=0.25 SENSITIVE Sensitive     NITROFURANTOIN <=16 SENSITIVE Sensitive     TRIMETH/SULFA <=20 SENSITIVE Sensitive     AMPICILLIN/SULBACTAM 16 INTERMEDIATE Intermediate     PIP/TAZO <=4 SENSITIVE Sensitive ug/mL    * >=100,000 COLONIES/mL KLEBSIELLA PNEUMONIAE  MRSA Next Gen by PCR, Nasal     Status: None   Collection Time: 05/15/23  5:40 PM   Specimen: Nasal Mucosa; Nasal Swab  Result Value Ref Range Status   MRSA by PCR Next Gen NOT DETECTED NOT DETECTED Final    Comment: (NOTE) The GeneXpert MRSA Assay (FDA approved for NASAL specimens only), is one component of a comprehensive MRSA colonization surveillance program. It is not intended to diagnose MRSA infection nor to guide or monitor treatment for MRSA infections. Test performance is not FDA approved in patients less than 70 years old. Performed at [facility], [address], [place], [place] [address]          Radiology Studies: CT HEAD WO CONTRAST ( ) Result Date: 05/16/2023 CLINICAL DATA:  Headache.  Increasing frequency or severity. EXAM: CT HEAD WITHOUT CONTRAST TECHNIQUE: Contiguous axial images were obtained from the base of the skull through the vertex without intravenous contrast. RADIATION DOSE REDUCTION: This exam was performed according to the departmental dose-optimization program which includes automated exposure control, adjustment of the mA and/or kV according to patient size and/or use of iterative reconstruction technique. COMPARISON:  MR head 02/09/2023. FINDINGS: Brain: Advanced atrophy and white matter disease is again noted. Remote lacunar infarcts of the basal ganglia and thalami bilaterally are stable. Extensive periventricular and subcortical white matter disease is stable bilaterally. Remote lacunar infarcts of the left corona radiata are stable. A remote infarct is  present in the posteromedial left occipital lobe. The ventricles are proportionate to the degree of atrophy. No significant extraaxial fluid collection is present. Remote lacunar infarcts of the cerebellum bilaterally are stable. No acute infarcts are present. Remote infarcts are present in the mid brain. Vascular: Extensive atherosclerotic changes are present within the cavernous internal carotid arteries bilaterally and at the dural margin of the left vertebral artery. No hyperdense vessel is present. Skull: Calvarium is intact. No focal lytic or blastic lesions are present. No significant extracranial soft tissue lesion is present. Sinuses/Orbits: The paranasal sinuses and mastoid  air cells are clear. Bilateral lens replacements are noted. Globes and orbits are otherwise unremarkable. IMPRESSION: 1. No acute intracranial abnormality or significant interval change. 2. Advanced atrophy and white matter disease likely reflects the sequela of chronic microvascular ischemia. 3. Multiple remote infarcts as described are stable. Electronically Signed   By: Marin Roberts M.D.   On: 05/16/2023 20:01        Scheduled Meds:  aspirin EC  81 mg Oral Daily   atorvastatin  5 mg Oral QHS   azithromycin  500 mg Oral Daily   enoxaparin (LOVENOX) injection  30 mg Subcutaneous Q24H   gabapentin  200 mg Oral BID   hydroxychloroquine  200 mg Oral Daily   insulin aspart  0-5 Units Subcutaneous QHS   insulin aspart  0-9 Units Subcutaneous TID WC   insulin glargine  15 Units Subcutaneous QHS   isosorbide mononitrate  30 mg Oral Daily   linaclotide  145 mcg Oral Daily   metoprolol tartrate  12.5 mg Oral BID   pantoprazole  40 mg Oral Daily   sertraline  75 mg Oral Daily   valproic acid  500 mg Oral TID   Continuous Infusions:  lactated ringers 100 mL/hr at 05/17/23 0849   meropenem (MERREM) IV 500 mg (05/17/23 0850)     LOS: 2 days    Time spent: 35 minutes    Aiden Rao A Larraine Argo, MD [facility]   If 7PM-7AM, please contact night-coverage [email or web address]  05/17/2023, 1:25 PM

## 2023-05-17 NOTE — Evaluation (Signed)
 Clinical/Bedside Swallow Evaluation Patient Details  Name: Olivia Burnett MRN: [id number] Date of Birth: November 25, 1950  Today's Date: 05/17/2023 Time: SLP Start Time (ACUTE ONLY): 0845 SLP Stop Time (ACUTE ONLY): 0900 SLP Time Calculation (min) (ACUTE ONLY): 15 min  Past Medical History:  Past Medical History:  Diagnosis Date   CAD S/P percutaneous coronary angioplasty 05/15/2023   Chronic ITP (idiopathic thrombocytopenia) (HCC) 05/15/2023   Diabetes mellitus without complication (HCC)    Essential hypertension 05/15/2023   Ganglion cyst    Hypertension    Psoriatic arthritis (HCC) 05/15/2023   Seizures (HCC)    Stroke [facility])    Past Surgical History:  Past Surgical History:  Procedure Laterality Date   ABDOMINAL HYSTERECTOMY     APPENDECTOMY     TONSILLECTOMY     HPI:  Patient is a 73 y.o. female with PMH: HTN, IDDM, CAD, CVA with right sided weakness. She presented to [facility] ED from SNF with dysuria, productive cough, SOB, fevers and pleuritic chest pain. In ED she was afebrile, saturating well on RA with mild tachycardia and elevated BP. Glucose 353, WBC 10,900.  CTA chest is negative for PE but concerning for multifocal pneumonia. SLP swallow evaluation ordered.    Assessment / Plan / Recommendation  Clinical Impression  Patient is not currently presenting with clinical s/s of dysphagia as per this bedside swallow evaluation. She denies any h/o or current difficulties with swallowing. SLP assessed her swallow with thin liquids, regular solids (breakfast meal) and with her medications. She typically takes medications whole, all at one time but SLP and RN convinced her to try taking half at a time. Swallow intiation with all PO's was timely, no overt s/s aspiration during or after PO intake. SLP not recommending further skilled intervention at this time. SLP Visit Diagnosis: Dysphagia, unspecified (R13.10)    Aspiration Risk  No limitations    Diet Recommendation Regular;Thin  liquid    Supervision: Patient able to self feed Compensations: Small sips/bites Postural Changes: Seated upright at 90 degrees    Other  Recommendations Oral Care Recommendations: Oral care BID    Recommendations for follow up therapy are one component of a multi-disciplinary discharge planning process, led by the attending physician.  Recommendations may be updated based on patient status, additional functional criteria and insurance authorization.  Follow up Recommendations No SLP follow up      Assistance Recommended at Discharge    Functional Status Assessment Patient has not had a recent decline in their functional status  Frequency and Duration            Prognosis        Swallow Study   General Date of Onset: 05/15/23 HPI: Patient is a 73 y.o. female with PMH: HTN, IDDM, CAD, CVA with right sided weakness. She presented to [facility] ED from SNF with dysuria, productive cough, SOB, fevers and pleuritic chest pain. In ED she was afebrile, saturating well on RA with mild tachycardia and elevated BP. Glucose 353, WBC 10,900.  CTA chest is negative for PE but concerning for multifocal pneumonia. SLP swallow evaluation ordered. Type of Study: Bedside Swallow Evaluation Previous Swallow Assessment: none found Diet Prior to this Study: Regular;Thin liquids (Level 0) Temperature Spikes Noted: No Respiratory Status: Room air History of Recent Intubation: No Behavior/Cognition: Alert;Cooperative;Pleasant mood Oral Cavity Assessment: Within Functional Limits Oral Care Completed by SLP: No Oral Cavity - Dentition: Adequate natural dentition Vision: Functional for self-feeding Self-Feeding Abilities: Able to feed self Patient Positioning: Upright in  bed Baseline Vocal Quality: Normal Volitional Swallow: Able to elicit    Oral/Motor/Sensory Function Overall Oral Motor/Sensory Function: Within functional limits   Ice Chips     Thin Liquid Thin Liquid: Within functional  limits Presentation: Cup;Self Fed    Nectar Thick     Honey Thick     Puree Puree: Not tested   Solid     Solid: Within functional limits Presentation: Self Fed      Angela Nevin, MA, CCC-SLP Speech Therapy

## 2023-05-17 NOTE — Plan of Care (Signed)
  Problem: Skin Integrity: Goal: Risk for impaired skin integrity will decrease Outcome: Progressing   

## 2023-05-17 NOTE — TOC Initial Note (Signed)
 Transition of Care [facility]) - Initial/Assessment Note    Patient Details  Name: Olivia Burnett MRN: [id number] Date of Birth: [birth year]  Transition of [facility] CM/SW Contact:    Howell Rucks, RN Phone Number: 05/17/2023, 8:49 AM  Clinical Narrative:  Patient admitted from [facility] LTC, per [facility], admissions coordinator, can return when ready, team notified.   TOC will continue to follow.                    Patient Goals and CMS Choice            Expected Discharge Plan and Services                                              Prior Living Arrangements/Services                       Activities of Daily Living   ADL Screening (condition at time of admission) Independently performs ADLs?: No Does the patient have a NEW difficulty with bathing/dressing/toileting/self-feeding that is expected to last >3 days?: No Does the patient have a NEW difficulty with getting in/out of bed, walking, or climbing stairs that is expected to last >3 days?: No Does the patient have a NEW difficulty with communication that is expected to last >3 days?: No Is the patient deaf or have difficulty hearing?: Yes Does the patient have difficulty seeing, even when wearing glasses/contacts?: Yes Does the patient have difficulty concentrating, remembering, or making decisions?: Yes  Permission Sought/Granted                  Emotional Assessment              Admission diagnosis:  UTI (urinary tract infection) [N39.0] Hyperglycemia [R73.9] History of UTI [Z87.440] Pneumonia due to infectious organism, unspecified laterality, unspecified part of lung [J18.9] Patient Active Problem List   Diagnosis Date Noted   UTI (urinary tract infection) 05/15/2023   History of stroke 05/15/2023   Essential hypertension 05/15/2023   Thrombocytopenia (HCC) 05/15/2023   CAD S/P percutaneous coronary angioplasty 05/15/2023   Chronic ITP (idiopathic thrombocytopenia) (HCC)  05/15/2023   Psoriatic arthritis (HCC) 05/15/2023   Multifocal pneumonia 05/15/2023   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)    Seizures (HCC)    Stroke-like episode 02/11/2023   Acute left-sided weakness 02/09/2023   CKD stage 3a, GFR 45-59 ml/min (HCC) 02/09/2023   Elevated serum creatinine 02/09/2023   PCP:  System, Provider Not In Pharmacy:  No Pharmacies Listed    Social Drivers of Health (SDOH) Social History: SDOH Screenings   Food Insecurity: No Food Insecurity (02/09/2023)  Housing: Low Risk  (02/09/2023)  Transportation Needs: No Transportation Needs (02/09/2023)  Utilities: Not At Risk (02/09/2023)  Tobacco Use: Unknown (05/15/2023)   SDOH Interventions:     Readmission Risk Interventions     No data to display

## 2023-05-18 ENCOUNTER — Inpatient Hospital Stay (HOSPITAL_COMMUNITY)

## 2023-05-18 DIAGNOSIS — J189 Pneumonia, unspecified organism: Secondary | ICD-10-CM | POA: Diagnosis not present

## 2023-05-18 LAB — BRAIN NATRIURETIC PEPTIDE: B Natriuretic Peptide: 919.6 pg/mL — ABNORMAL HIGH (ref 0.0–100.0)

## 2023-05-18 LAB — GLUCOSE, CAPILLARY
Glucose-Capillary: 197 mg/dL — ABNORMAL HIGH (ref 70–99)
Glucose-Capillary: 164 mg/dL — ABNORMAL HIGH (ref 70–99)
Glucose-Capillary: 154 mg/dL — ABNORMAL HIGH (ref 70–99)
Glucose-Capillary: 133 mg/dL — ABNORMAL HIGH (ref 70–99)
Glucose-Capillary: 140 mg/dL — ABNORMAL HIGH (ref 70–99)
Glucose-Capillary: 157 mg/dL — ABNORMAL HIGH (ref 70–99)

## 2023-05-18 LAB — LACTIC ACID, PLASMA
Lactic Acid, Venous: 2.9 mmol/L (ref 0.5–1.9)
Lactic Acid, Venous: 1.6 mmol/L (ref 0.5–1.9)

## 2023-05-18 LAB — CBC
HCT: 33.4 % — ABNORMAL LOW (ref 36.0–46.0)
Hemoglobin: 10.6 g/dL — ABNORMAL LOW (ref 12.0–15.0)
MCH: 30.3 pg (ref 26.0–34.0)
MCHC: 31.7 g/dL (ref 30.0–36.0)
MCV: 95.4 fL (ref 80.0–100.0)
Platelets: 179 10*3/uL (ref 150–400)
RBC: 3.5 MIL/uL — ABNORMAL LOW (ref 3.87–5.11)
RDW: 13.9 % (ref 11.5–15.5)
WBC: 18.1 10*3/uL — ABNORMAL HIGH (ref 4.0–10.5)
nRBC: 0 % (ref 0.0–0.2)

## 2023-05-18 LAB — TROPONIN I (HIGH SENSITIVITY)
Troponin I (High Sensitivity): 1050 ng/L (ref <18)
Troponin I (High Sensitivity): 920 ng/L (ref <18)

## 2023-05-18 LAB — BLOOD GAS, ARTERIAL
Acid-base deficit: 10.7 mmol/L — ABNORMAL HIGH (ref 0.0–2.0)
Bicarbonate: 14.3 mmol/L — ABNORMAL LOW (ref 20.0–28.0)
O2 Content: 2 L/min
O2 Saturation: 95.7 %
Patient temperature: 37
pCO2 arterial: 29 mmHg — ABNORMAL LOW (ref 32–48)
pH, Arterial: 7.3 — ABNORMAL LOW (ref 7.35–7.45)
pO2, Arterial: 68 mmHg — ABNORMAL LOW (ref 83–108)

## 2023-05-18 LAB — BASIC METABOLIC PANEL
Anion gap: 12 (ref 5–15)
Anion gap: 13 (ref 5–15)
BUN: 42 mg/dL — ABNORMAL HIGH (ref 8–23)
BUN: 43 mg/dL — ABNORMAL HIGH (ref 8–23)
CO2: 14 mmol/L — ABNORMAL LOW (ref 22–32)
CO2: 18 mmol/L — ABNORMAL LOW (ref 22–32)
Calcium: 8.7 mg/dL — ABNORMAL LOW (ref 8.9–10.3)
Calcium: 8.3 mg/dL — ABNORMAL LOW (ref 8.9–10.3)
Chloride: 106 mmol/L (ref 98–111)
Chloride: 98 mmol/L (ref 98–111)
Creatinine, Ser: 1.61 mg/dL — ABNORMAL HIGH (ref 0.44–1.00)
Creatinine, Ser: 1.58 mg/dL — ABNORMAL HIGH (ref 0.44–1.00)
GFR, Estimated: 34 mL/min — ABNORMAL LOW (ref >60)
GFR, Estimated: 34 mL/min — ABNORMAL LOW (ref >60)
Glucose, Bld: 184 mg/dL — ABNORMAL HIGH (ref 70–99)
Glucose, Bld: 143 mg/dL — ABNORMAL HIGH (ref 70–99)
Potassium: 4.2 mmol/L (ref 3.5–5.1)
Potassium: 4.1 mmol/L (ref 3.5–5.1)
Sodium: 132 mmol/L — ABNORMAL LOW (ref 135–145)
Sodium: 129 mmol/L — ABNORMAL LOW (ref 135–145)

## 2023-05-18 LAB — MRSA NEXT GEN BY PCR, NASAL: MRSA by PCR Next Gen: NOT DETECTED

## 2023-05-18 MED ORDER — SODIUM CHLORIDE 0.9 % IV SOLN
1.0000 g | Freq: Two times a day (BID) | INTRAVENOUS | Status: DC
  Administered 2023-05-18 – 2023-05-22 (×8): 1 g via INTRAVENOUS
  Filled 2023-05-18 (×8): qty 20

## 2023-05-18 MED ORDER — ORAL CARE MOUTH RINSE: 15.0000 mL | OROMUCOSAL | Status: DC | PRN

## 2023-05-18 MED ORDER — LACTATED RINGERS IV BOLUS
500.0000 mL | Freq: Once | INTRAVENOUS | Status: AC
  Administered 2023-05-18: 500 mL via INTRAVENOUS

## 2023-05-18 MED ORDER — HEPARIN (PORCINE) 25000 UT/250ML-% IV SOLN
1050.0000 [IU]/h | INTRAVENOUS | Status: AC
  Administered 2023-05-18: 850 [IU]/h via INTRAVENOUS
  Administered 2023-05-19: 1150 [IU]/h via INTRAVENOUS
  Administered 2023-05-20 – 2023-05-21 (×2): 1050 [IU]/h via INTRAVENOUS
  Filled 2023-05-18 (×3): qty 250

## 2023-05-18 MED ORDER — SODIUM CHLORIDE 0.9 % IV SOLN
500.0000 mg | Freq: Once | INTRAVENOUS | Status: AC
  Administered 2023-05-18: 500 mg via INTRAVENOUS
  Filled 2023-05-18: qty 10

## 2023-05-18 MED ORDER — ACETAMINOPHEN 10 MG/ML IV SOLN
1000.0000 mg | Freq: Once | INTRAVENOUS | Status: AC
  Administered 2023-05-18: 1000 mg via INTRAVENOUS
  Filled 2023-05-18: qty 100

## 2023-05-18 MED ORDER — NITROGLYCERIN 0.4 MG SL SUBL
0.4000 mg | SUBLINGUAL_TABLET | SUBLINGUAL | Status: DC | PRN
  Administered 2023-05-18 – 2023-05-19 (×3): 0.4 mg via SUBLINGUAL
  Filled 2023-05-18 (×3): qty 1

## 2023-05-18 MED ORDER — SODIUM BICARBONATE 8.4 % IV SOLN
50.0000 meq | Freq: Once | INTRAVENOUS | Status: AC
  Administered 2023-05-18: 50 meq via INTRAVENOUS
  Filled 2023-05-18: qty 50

## 2023-05-18 MED ORDER — STERILE WATER FOR INJECTION IV SOLN
INTRAVENOUS | Status: DC
  Filled 2023-05-18: qty 150
  Filled 2023-05-18: qty 1000
  Filled 2023-05-18: qty 150
  Filled 2023-05-18: qty 1000

## 2023-05-18 MED ORDER — APIXABAN 5 MG PO TABS
5.0000 mg | ORAL_TABLET | Freq: Two times a day (BID) | ORAL | Status: DC
  Administered 2023-05-18: 5 mg via ORAL
  Filled 2023-05-18: qty 1

## 2023-05-18 MED ORDER — CHLORHEXIDINE GLUCONATE CLOTH 2 % EX PADS
6.0000 | MEDICATED_PAD | Freq: Every day | CUTANEOUS | Status: DC
  Administered 2023-05-18 – 2023-05-23 (×5): 6 via TOPICAL

## 2023-05-18 MED ORDER — ATORVASTATIN CALCIUM 10 MG PO TABS
10.0000 mg | ORAL_TABLET | Freq: Every day | ORAL | Status: DC
  Administered 2023-05-18 – 2023-05-23 (×6): 10 mg via ORAL
  Filled 2023-05-18 (×6): qty 1

## 2023-05-18 NOTE — Plan of Care (Signed)
  Problem: Nutrition: Goal: Adequate nutrition will be maintained Outcome: Not Progressing   Problem: Elimination: Goal: Will not experience complications related to urinary retention Outcome: Completed/Met

## 2023-05-18 NOTE — Progress Notes (Signed)
 PHARMACY - ANTICOAGULATION CONSULT NOTE  Pharmacy Consult for Eliquis Indication: atrial fibrillation  Allergies  Allergen Reactions   Ciprofloxacin     Per MAR   Gemifloxacin     Per MAR   Infliximab Other (See Comments)    Unknown reaction  unknown   Levaquin [Levofloxacin]     Per MAR   Meperidine Other (See Comments)    Unknown reaction  unknown   Mercury     hives   Moxifloxacin     Per MAR   Ofloxacin     Per MAR   Penicillins     rash   Phenobarbital Other (See Comments)    Unknown reaction  unknown   Pregabalin Other (See Comments)    Dry mouth, confusion, incontinence   Prochlorperazine Other (See Comments)    Unknown reaction  unknown    Patient Measurements: Height: 5\' 5"  (165.1 cm) Weight: 72.4 kg (159 lb 9.8 oz) IBW/kg (Calculated) : 57 Heparin Dosing Weight:   Vital Signs: Temp: 98.9 F (37.2 C) (03/25 1200) Temp Source: Oral (03/25 1200) BP: 136/100 (03/25 1014) Pulse Rate: 122 (03/25 1014)  Labs: Recent Labs    05/16/23 0513 05/17/23 0435 05/18/23 0458  HGB 10.7* 9.5* 10.6*  HCT 32.1* 28.6* 33.4*  PLT 137* 142* 179  CREATININE 1.11* 2.03* 1.61*    Estimated Creatinine Clearance: 31 mL/min (A) (by C-G formula based on SCr of 1.61 mg/dL (H)).   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   05/11/2023   acetaminophen (TYLENOL) 500 MG tablet Take 1,000 mg by mouth in the morning, at noon, and at bedtime.    05/14/2023 at  2:00 PM   amLODipine (NORVASC) 10 MG tablet Take 10 mg by mouth daily.   05/14/2023 at  8:00 AM   aspirin EC 81 MG tablet Take 81 mg by mouth daily. Swallow whole.   05/14/2023 at  8:00 AM   atorvastatin (LIPITOR) 10 MG tablet Take 5 mg by mouth at bedtime.   05/13/2023   calcium carbonate (OS-CAL) 600 MG TABS tablet Take 600 mg by mouth every morning.   05/14/2023 at  9:00 AM   Carboxymethylcellulose Sodium (ARTIFICIAL TEARS OP) Place 1 drop into both eyes 4 (four) times daily.   05/14/2023 at  4:00 PM   chlorhexidine (PERIDEX) 0.12 % solution Take 15 mLs by mouth 2 (two) times daily.   05/14/2023 at  9:00 AM   Cranberry 450 MG TABS Take 450 mg by mouth daily.   05/14/2023 at  8:00 AM   gabapentin (NEURONTIN) 100 MG capsule Take 200 mg by mouth 2 (two) times daily.   05/14/2023 at  9:00 AM   guaiFENesin (ROBITUSSIN) 100 MG/5ML liquid Take 15 mLs by mouth every 4 (four) hours as needed for cough or to loosen phlegm.   Taking As Needed   hydroxychloroquine (PLAQUENIL) 200 MG tablet Take 200 mg by mouth daily.   05/14/2023 at  8:00 AM   insulin  glargine (LANTUS) 100 UNIT/ML injection Inject 0.2 mLs (20 Units total) into the skin daily.   05/14/2023 at  6:00 AM   isosorbide mononitrate (IMDUR) 30 MG 24 hr tablet Take 30 mg by mouth daily.   05/14/2023   LACTOBACILLUS PO Take 1 capsule by mouth daily.   05/14/2023 at  8:00 AM   LINZESS 145 MCG CAPS capsule Take 145 mcg by mouth daily.   05/14/2023 at  8:00 AM   lisinopril (PRINIVIL,ZESTRIL) 10 MG tablet Take 10 mg by mouth daily.   05/14/2023 at  8:00 AM   loperamide (IMODIUM A-D) 2 MG tablet Take 2 mg by mouth 4 (four) times daily as needed for diarrhea or loose stools.   05/11/2023   melatonin 3 MG TABS tablet Take 9 mg by mouth at bedtime.   05/13/2023   Menthol, Topical Analgesic, (BIOFREEZE COOL THE PAIN) 4 % GEL Apply 1 Application topically as needed (Moderate pain).   Taking As Needed   Menthol-Zinc Oxide (CALMOSEPTINE) 0.44-20.6 % OINT Apply 1  Application topically in the morning and at bedtime.   05/14/2023 at  9:00 AM   metoprolol tartrate (LOPRESSOR) 25 MG tablet Take 12.5 mg by mouth 2 (two) times daily.   05/14/2023 at  9:00 AM   nystatin cream (MYCOSTATIN) Apply 1 Application topically 2 (two) times daily as needed (rash).   Taking As Needed   polyethylene glycol (MIRALAX / GLYCOLAX) packet Take 17 g by mouth daily.   05/14/2023 at  8:00 AM   promethazine (PHENERGAN) 25 MG tablet Take 25 mg by mouth every 6 (six) hours as needed for nausea or vomiting.   05/14/2023 at  9:07 AM   selenium sulfide (SELSUN) 1 % LOTN Apply 1 Application topically daily. On Wednesday and Friday   05/14/2023 at  5:00 PM   sennosides-docusate sodium (SENOKOT-S) 8.6-50 MG tablet Take 2 tablets by mouth in the morning and at bedtime.   05/14/2023 at  8:00 AM   sertraline (ZOLOFT) 50 MG tablet Take 75 mg by mouth daily.   05/14/2023 at  8:00 AM   simethicone (MYLICON) 125 MG chewable tablet Chew 125 mg by mouth at bedtime as needed for flatulence.   Taking As Needed   sodium chloride 1 g tablet Take 1 g by mouth daily. 1 tablet Oral one time a day Routine Give 1 tablet by mouth one time a day for Hyponatremia for 7 Days 05/12/2023 05/19/2023   05/14/2023 at  9:00 AM   triamcinolone (KENALOG) 0.1 % paste Use as directed 1 Application in the mouth or throat at bedtime as needed (cold sores).   Taking As Needed   [EXPIRED] fosfomycin (MONUROL) 3 g PACK Take 3 g by mouth once.       Assessment: Pharmacy consulted for apixaban dosing for Afib. SCr 1.61, Hg 10.6 PLT 179> hx of chronic ITP w/ PLT low of 109 on 3/22. 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    Plan:  Start apixaban 5 mg po bid  Will provide education & 30 day free card prior to discharge (not sure if she will be able to use card at [facility])  Herby Abraham, Pharm.D Use secure chat for questions 05/18/2023 12:32 PM

## 2023-05-18 NOTE — Progress Notes (Signed)
 PHARMACY - ANTICOAGULATION CONSULT NOTE  Pharmacy Consult for heparin Indication: chest pain/ACS  Allergies  Allergen Reactions   Ciprofloxacin     Per MAR   Gemifloxacin     Per MAR   Infliximab Other (See Comments)    Unknown reaction  unknown   Levaquin [Levofloxacin]     Per MAR   Meperidine Other (See Comments)    Unknown reaction  unknown   Mercury     hives   Moxifloxacin     Per MAR   Ofloxacin     Per MAR   Penicillins     rash   Phenobarbital Other (See Comments)    Unknown reaction  unknown   Pregabalin Other (See Comments)    Dry mouth, confusion, incontinence   Prochlorperazine Other (See Comments)    Unknown reaction  unknown    Patient Measurements: Height: 5\' 5"  (165.1 cm) Weight: 72.4 kg (159 lb 9.8 oz) IBW/kg (Calculated) : 57 Heparin Dosing Weight: 71 kg  Vital Signs: Temp: 98.9 F (37.2 C) (03/25 1200) Temp Source: Oral (03/25 1200) BP: 105/50 (03/25 1825) Pulse Rate: 122 (03/25 1825)  Labs: Recent Labs    05/16/23 0513 05/17/23 0435 05/18/23 0458 05/18/23 1656  HGB 10.7* 9.5* 10.6*  --   HCT 32.1* 28.6* 33.4*  --   PLT 137* 142* 179  --   CREATININE 1.11* 2.03* 1.61* 1.58*  TROPONINIHS  --   --   --  1,050*    Estimated Creatinine Clearance: 31.6 mL/min (A) (by C-G formula based on SCr of 1.58 mg/dL (H)).   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 On 3/25 am, patient was noted to be more lethargic and tachypneic; she was transferred to the stepdown unit. She was found to have new onset  afib and started on Eliquis. She received one dose. Later in evening, concern for ACS with elevated troponin and chest pain. Pharmacy consult to manage heparin infusion.  Goal of Therapy:  Heparin level 0.3-0.7 units/ml aPTT 66-102 seconds Monitor platelets by anticoagulation protocol: Yes   Plan:  -Given Eliquis at 1500, normally wait to start heparin infusion approximately 12 hours after last dose; however given urgency of situation, will start heparin now -Heparin 850 units/hr -Check heparin level and aPTT 8 hours after start of infusion -Daily CBC   Pricilla Riffle, PharmD, BCPS Clinical Pharmacist 05/18/2023 6:54 PM

## 2023-05-18 NOTE — Progress Notes (Signed)
 PHARMACY NOTE:  ANTIMICROBIAL RENAL DOSAGE ADJUSTMENT  Current antimicrobial regimen includes a mismatch between antimicrobial dosage and estimated renal function.  As per policy approved by the Pharmacy & Therapeutics and Medical Executive Committees, the antimicrobial dosage will be adjusted accordingly.  Current antimicrobial dosage:  Merrem 500 mg IV q12h  Indication: ESBL UTI, PNA  Renal Function:  Estimated Creatinine Clearance: 31 mL/min (A) (by C-G formula based on SCr of 1.61 mg/dL (H)).    Antimicrobial dosage has been changed to:  Merrem 1 gm IV q12h  Additional comments:Give extra dose Merrem 500 mg IV now to make total AM dose = 1 gram.   Consider change to Septra 1 DS BID when ready to transition to oral   Thank you for allowing pharmacy to be a part of this patient's care.   Herby Abraham, Pharm.D Use secure chat for questions 05/18/2023 10:46 AM

## 2023-05-18 NOTE — Progress Notes (Addendum)
 PROGRESS NOTE    Olivia Burnett  ZOX:[id number] DOB: 1951-02-19 DOA: 05/14/2023 PCP: System, Provider Not In   Brief Narrative: 73 year old with past medical history significant for hypertension, insulin-dependent diabetes, CAD, chronic ITP, psoriatic arthritis, CVA with right-sided weakness, presented with dysuria, productive cough, shortness of breath, fevers and pleuritic chest pain.  Patient presented with mild tachycardia, elevated blood pressure, hyperglycemia CBG 353, white blood cell 10,000, CT chest negative for PE but concerning for multifocal pneumonia.  Previous/25: Patient was noted to be more lethargic and tachypneic.  She has been transferred to stepdown unit, blood gas with hypoxia, she was noted to have new onset A-fib heart rate 100. Started on eliquis, HF oxygen, cardiology has been consulted.   Assessment & Plan:   Principal Problem:   Multifocal pneumonia Active Problems:   CKD stage 3a, GFR 45-59 ml/min (HCC)   UTI (urinary tract infection)   History of stroke   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)   Essential hypertension   Seizures (HCC)   Thrombocytopenia (HCC)   CAD S/P percutaneous coronary angioplasty   Chronic ITP (idiopathic thrombocytopenia) (HCC)   Psoriatic arthritis (HCC)  Addendum from 6;40PM Received message by nurse Troponin 1000, lactic acid 29. Patient report chest discomfort, dullness.  -Plan to repeat EKG -Change Eliquis to Heparin Gtt.  -Increase lipitor to 10 mg -IV bolus -Nitrolycerin PRN of BP allows.  -Discussed with Cardiology on call who agrees with above intervention. Cycle troponin, if chest pain persist to contact cardiology again. Night coverage for [facility].   1-Multifocal Pneumonia, Acute Hypoxic respiratory Failure.  -Presents with cough, dyspnea and pleuritic chest pain. -CTA chest: Negative for PE, patchy infiltrates in the lungs, greater on the right. This is nonspecific but may  represent early multifocal pneumonia. -Respiratory panel negative for influenza, COVID PCR negative. -Blood cultures: No growth.  -Continue  azithromycin. Ceftriaxone change to Meropenem  to cover for Klebsiella ESBL.  -Speech for swallow evaluation--no evidence of aspiration.  3/25: Hypoxic  and lethargic. Placed on Oxygen.  Transfer to step down unit.  Continue with antibiotics.  Chest x ray with small left effusion and atelectasis.   Metabolic Acidosis;  Concern for sepsis, repeating blood cultures, lactic acid, IV fluids change to bicarb Gtt.  ABG obtain CO2 low--concern with metabolic acidosis  Placed on oxygen. Bicarb given.   Acute Metabolic Encephalopathy:  She is more sleepy, lethargic.  -Plan: ABG obtain. She is hypoxic.  -in setting of infection and hypoxia and metabolic acidosis.   A fib, new onset.  Prior history of Stroke, 73 year old. [place] score 7.  Cardiology consulted.  Started on Eliquis.  Rate down to 90--100. If increase will need BB or Amiodarone.   -UTI: -Presents  with dysuria, UA with more than 50 white blood cells. -Follow urine culture: Klebsiella Pneumoniae, ESBL.  -Ceftriaxone change to Meropenem. Continue.   AKI CKD 3A: -Creatinine  baseline 1.3 Cr increase to 2. --1.6 renal US. Negative for hydronephrosis.  Continue with IV fluids. Change to Bicarb gtt.  Strict I and O.   Hyponatremia: -Continue with IV fluids.   Tremor head, arms:  -For last 8 months.  -Needs follow up with neurology   Type 2 diabetes, hyperglycemia uncontrolled -Continue Lantus and sliding scale insulin  Hypertension: -Continue Imdur, lisinopril and metoprolol  Chronic ITP: -Monitor Platelet count  Psoriatic arthritis: -Continue with Plaquenil  CAD: -Continue aspirin, Lipitor, metoprolol and Imdur.  History of CVA: -Continue with  aspirin and Lipitor   Seizures: -Per daughter Keppra was discontinue from adverse effect, getting aggressive, agitated./   -discussed with Neurology, Patient started on  Depakote.   Headaches; resolved. CT head only showed old stroke.    Estimated body mass index is 26.56 kg/m as calculated from the following:   Height as of this encounter: 5\' 5"  (1.651 m).   Weight as of this encounter: 72.4 kg.   DVT prophylaxis: Lovenox Code Status: DNR Family Communication: Daughter over phone 3/22 Disposition Plan:  Status is: Inpatient Remains inpatient appropriate because: management of PNA    Consultants:  Neurology phone  Procedures:  none  Antimicrobials:    Subjective: She is more lethargic today. More tachypnea.  She report dyspnea.   Objective: Vitals:   05/17/23 2102 05/17/23 2108 05/18/23 0625 05/18/23 1014  BP: (!) 146/70 (!) 146/70 109/68 (!) 136/100  Pulse: 67 67  (!) 122  Resp: 20  19 (!) 22  Temp: (!) 100.9 F (38.3 C)  99.6 F (37.6 C) 99.8 F (37.7 C)  TempSrc: Oral  Oral Oral  SpO2: 94%  93% 93%  Weight:      Height:        Intake/Output Summary (Last 24 hours) at 05/18/2023 1022 Last data filed at 05/18/2023 0939 Gross per 24 hour  Intake 2381.16 ml  Output 1300 ml  Net 1081.16 ml   Filed Weights   05/15/23 0010 05/15/23 1100  Weight: 74.8 kg 72.4 kg    Examination:  General exam: NAD Respiratory system: Decreased breath sound, no wheezing.  Cardiovascular system: S 1, S 2 RRR Gastrointestinal system: BS present, soft, nt Central nervous system: sleepy, tachypnea.  Extremities: no edema    Data Reviewed: I have personally reviewed following labs and imaging studies  CBC: Recent Labs  Lab 05/14/23 2122 05/15/23 0527 05/16/23 0513 05/17/23 0435 05/18/23 0458  WBC 10.9* 10.9* 12.5* 10.8* 18.1*  NEUTROABS 8.5*  --   --   --   --   HGB 11.3* 10.3* 10.7* 9.5* 10.6*  HCT 32.1* 29.5* 32.1* 28.6* 33.4*  MCV 88.7 89.1 90.2 92.3 95.4  PLT 117* 109* 137* 142* 179   Basic Metabolic Panel: Recent Labs  Lab 05/14/23 2122 05/15/23 0527 05/16/23 0513  05/17/23 0435 05/18/23 0458  NA 130* 131* 135 134* 132*  K 3.9 3.9 4.2 4.4 4.2  CL 103 104 106 105 106  CO2 19* 20* 18* 17* 14*  GLUCOSE 353* 216* 108* 101* 184*  BUN 24* 20 26* 39* 42*  CREATININE 1.16* 1.09* 1.11* 2.03* 1.61*  CALCIUM 8.6* 8.3* 8.4* 8.2* 8.7*   GFR: Estimated Creatinine Clearance: 31 mL/min (A) (by C-G formula based on SCr of 1.61 mg/dL (H)). Liver Function Tests: Recent Labs  Lab 05/14/23 2122  AST 21  ALT 12  ALKPHOS 68  BILITOT 0.7  PROT 7.1  ALBUMIN 4.0   No results for input(s): "LIPASE", "AMYLASE" in the last 168 hours. Recent Labs  Lab 05/15/23 1536  AMMONIA 23   Coagulation Profile: Recent Labs  Lab 05/14/23 2135  INR 1.1   Cardiac Enzymes: No results for input(s): "CKTOTAL", "CKMB", "CKMBINDEX", "TROPONINI" in the last 168 hours. BNP (last 3 results) No results for input(s): "PROBNP" in the last 8760 hours. HbA1C: No results for input(s): "HGBA1C" in the last 72 hours. CBG: Recent Labs  Lab 05/17/23 0708 05/17/23 1132 05/17/23 1637 05/17/23 2102 05/18/23 0729  GLUCAP 102* 114* 111* 166* 197*   Lipid Profile: No results for input(s): "  CHOL", "HDL", "LDLCALC", "TRIG", "CHOLHDL", "LDLDIRECT" in the last 72 hours. Thyroid Function Tests: No results for input(s): "TSH", "T4TOTAL", "FREET4", "T3FREE", "THYROIDAB" in the last 72 hours. Anemia Panel: No results for input(s): "VITAMINB12", "FOLATE", "FERRITIN", "TIBC", "IRON", "RETICCTPCT" in the last 72 hours. Sepsis Labs: Recent Labs  Lab 05/14/23 2128  LATICACIDVEN 1.2    Recent Results (from the past 240 hours)  Blood Culture (routine x 2)     Status: None (Preliminary result)   Collection Time: 05/14/23  9:22 PM   Specimen: BLOOD  Result Value Ref Range Status   Specimen Description   Final    BLOOD LEFT ANTECUBITAL Performed at [facility], [address] [place], [place] [address]    Special Requests   Final    BOTTLES DRAWN AEROBIC AND  ANAEROBIC Blood Culture results may not be optimal due to an inadequate volume of blood received in culture bottles Performed at [facility], [address], [place], [place] [address]    Culture   Final    NO GROWTH 2 DAYS Performed at [facility], [address] [place], [place] [address]    Report Status PENDING  Incomplete  Blood Culture (routine x 2)     Status: None (Preliminary result)   Collection Time: 05/14/23  9:50 PM   Specimen: BLOOD RIGHT ARM  Result Value Ref Range Status   Specimen Description   Final    BLOOD RIGHT ARM Performed at [facility], [address], [place], [place] [address]    Special Requests   Final    BOTTLES DRAWN AEROBIC AND ANAEROBIC Blood Culture results may not be optimal due to an inadequate volume of blood received in culture bottles Performed at [facility], [address], [place], [place] [address]    Culture   Final    NO GROWTH 2 DAYS Performed at [facility], [address], [place], [place] [address]    Report Status PENDING  Incomplete  Resp panel by RT-PCR (RSV, Flu A&B, Covid) Anterior Nasal Swab     Status: None   Collection Time: 05/14/23 10:00 PM   Specimen: Anterior Nasal Swab  Result Value Ref Range Status   SARS Coronavirus 2 by RT PCR NEGATIVE NEGATIVE Final    Comment: (NOTE) SARS-CoV-2 target nucleic acids are NOT DETECTED.  The SARS-CoV-2 RNA is generally detectable in upper respiratory specimens during the acute phase of infection. The lowest concentration of SARS-CoV-2 viral copies this assay can detect is 138 copies/mL. A negative result does not preclude SARS-Cov-2 infection and should not be used as the sole basis for treatment or other patient management decisions. A negative result may occur with  improper specimen collection/handling, submission of specimen other than nasopharyngeal swab, presence of viral mutation(s) within the areas  targeted by this assay, and inadequate number of viral copies(<138 copies/mL). A negative result must be combined with clinical observations, patient history, and epidemiological information. The expected result is Negative.  Fact Sheet for Patients:  BloggerCourse.com  Fact Sheet for Healthcare Providers:  SeriousBroker.it  This test is no t yet approved or cleared by the [place] FDA and  has been authorized for detection and/or diagnosis of SARS-CoV-2 by FDA under an Emergency Use Authorization (EUA). This EUA will remain  in effect (meaning this test can be used) for the duration of the COVID-19 declaration under Section 564(b)(1) of the Act, 21 U.S.C.section 360bbb-3(b)(1), unless the authorization  is terminated  or revoked sooner.       Influenza A by PCR NEGATIVE NEGATIVE Final   Influenza B by PCR NEGATIVE NEGATIVE Final    Comment: (NOTE) The Xpert Xpress SARS-CoV-2/FLU/RSV plus assay is intended as an aid in the diagnosis of influenza from Nasopharyngeal swab specimens and should not be used as a sole basis for treatment. Nasal washings and aspirates are unacceptable for Xpert Xpress SARS-CoV-2/FLU/RSV testing.  Fact Sheet for Patients: BloggerCourse.com  Fact Sheet for Healthcare Providers: SeriousBroker.it  This test is not yet approved or cleared by the [place] FDA and has been authorized for detection and/or diagnosis of SARS-CoV-2 by FDA under an Emergency Use Authorization (EUA). This EUA will remain in effect (meaning this test can be used) for the duration of the COVID-19 declaration under Section 564(b)(1) of the Act, 21 U.S.C. section 360bbb-3(b)(1), unless the authorization is terminated or revoked.     Resp Syncytial Virus by PCR NEGATIVE NEGATIVE Final    Comment: (NOTE) Fact Sheet for  Patients: BloggerCourse.com  Fact Sheet for Healthcare Providers: SeriousBroker.it  This test is not yet approved or cleared by the [place] FDA and has been authorized for detection and/or diagnosis of SARS-CoV-2 by FDA under an Emergency Use Authorization (EUA). This EUA will remain in effect (meaning this test can be used) for the duration of the COVID-19 declaration under Section 564(b)(1) of the Act, 21 U.S.C. section 360bbb-3(b)(1), unless the authorization is terminated or revoked.  Performed at [facility], [address], [place], [place] [address]   Urine Culture     Status: Abnormal   Collection Time: 05/15/23 12:29 AM   Specimen: Urine, Random  Result Value Ref Range Status   Specimen Description   Final    URINE, RANDOM Performed at [facility], [address] [place], [place] [address]    Special Requests   Final    NONE Reflexed from [id number] Performed at [facility], [address] [place], [place] [address]    Culture (A)  Final    >=100,000 COLONIES/mL KLEBSIELLA PNEUMONIAE Confirmed Extended Spectrum Beta-Lactamase Producer (ESBL).  In bloodstream infections from ESBL organisms, carbapenems are preferred over piperacillin/tazobactam. They are shown to have a lower risk of mortality.    Report Status 05/17/2023 FINAL  Final   Organism ID, Bacteria KLEBSIELLA PNEUMONIAE (A)  Final      Susceptibility   Klebsiella pneumoniae - MIC*    AMPICILLIN >=32 RESISTANT Resistant     CEFAZOLIN >=64 RESISTANT Resistant     CEFEPIME >=32 RESISTANT Resistant     CEFTRIAXONE >=64 RESISTANT Resistant     CIPROFLOXACIN <=0.25 SENSITIVE Sensitive     GENTAMICIN <=1 SENSITIVE Sensitive     IMIPENEM <=0.25 SENSITIVE Sensitive     NITROFURANTOIN <=16 SENSITIVE Sensitive     TRIMETH/SULFA <=20 SENSITIVE Sensitive     AMPICILLIN/SULBACTAM 16 INTERMEDIATE  Intermediate     PIP/TAZO <=4 SENSITIVE Sensitive ug/mL    * >=100,000 COLONIES/mL KLEBSIELLA PNEUMONIAE  MRSA Next Gen by PCR, Nasal     Status: None   Collection Time: 05/15/23  5:40 PM   Specimen: Nasal Mucosa; Nasal Swab  Result Value Ref Range Status   MRSA by PCR Next Gen NOT DETECTED NOT DETECTED Final    Comment: (NOTE) The GeneXpert MRSA Assay (FDA approved for NASAL specimens only), is one component of a comprehensive MRSA colonization surveillance program. It is not intended to diagnose MRSA infection nor to  guide or monitor treatment for MRSA infections. Test performance is not FDA approved in patients less than 18 years old. Performed at [facility], [address], [place], [place] [address]          Radiology Studies: US RENAL Result Date: 05/17/2023 CLINICAL DATA:  [id number] AKI (acute kidney injury) (HCC) [id number] EXAM: RENAL / URINARY TRACT ULTRASOUND COMPLETE COMPARISON:  None Available. FINDINGS: Right Kidney: Renal measurements: 10.4 x 5.5 x 4.4 cm = volume: 131.5 mL. Echogenicity increased. No mass or hydronephrosis visualized. Left Kidney: Renal measurements: 10.5 x 5.1 x 4 cm = volume: 111.8 mL. Echogenicity increased. No mass or hydronephrosis visualized. Urinary bladder: Appears normal for degree of bladder distention. Other: None. IMPRESSION: Increased parenchymal echogenicity suggestive of renal parenchymal disease. Electronically Signed   By: Tish Frederickson M.D.   On: 05/17/2023 17:28   CT HEAD WO CONTRAST ( ) Result Date: 05/16/2023 CLINICAL DATA:  Headache.  Increasing frequency or severity. EXAM: CT HEAD WITHOUT CONTRAST TECHNIQUE: Contiguous axial images were obtained from the base of the skull through the vertex without intravenous contrast. RADIATION DOSE REDUCTION: This exam was performed according to the departmental dose-optimization program which includes automated exposure control, adjustment of the mA and/or kV according to  patient size and/or use of iterative reconstruction technique. COMPARISON:  MR head 02/09/2023. FINDINGS: Brain: Advanced atrophy and white matter disease is again noted. Remote lacunar infarcts of the basal ganglia and thalami bilaterally are stable. Extensive periventricular and subcortical white matter disease is stable bilaterally. Remote lacunar infarcts of the left corona radiata are stable. A remote infarct is present in the posteromedial left occipital lobe. The ventricles are proportionate to the degree of atrophy. No significant extraaxial fluid collection is present. Remote lacunar infarcts of the cerebellum bilaterally are stable. No acute infarcts are present. Remote infarcts are present in the mid brain. Vascular: Extensive atherosclerotic changes are present within the cavernous internal carotid arteries bilaterally and at the dural margin of the left vertebral artery. No hyperdense vessel is present. Skull: Calvarium is intact. No focal lytic or blastic lesions are present. No significant extracranial soft tissue lesion is present. Sinuses/Orbits: The paranasal sinuses and mastoid air cells are clear. Bilateral lens replacements are noted. Globes and orbits are otherwise unremarkable. IMPRESSION: 1. No acute intracranial abnormality or significant interval change. 2. Advanced atrophy and white matter disease likely reflects the sequela of chronic microvascular ischemia. 3. Multiple remote infarcts as described are stable. Electronically Signed   By: Marin Roberts M.D.   On: 05/16/2023 20:01        Scheduled Meds:  aspirin EC  81 mg Oral Daily   atorvastatin  5 mg Oral QHS   azithromycin  500 mg Oral Daily   enoxaparin (LOVENOX) injection  30 mg Subcutaneous Q24H   gabapentin  200 mg Oral BID   hydroxychloroquine  200 mg Oral Daily   insulin aspart  0-5 Units Subcutaneous QHS   insulin aspart  0-9 Units Subcutaneous TID WC   insulin glargine  15 Units Subcutaneous QHS    isosorbide mononitrate  30 mg Oral Daily   linaclotide  145 mcg Oral Daily   metoprolol tartrate  12.5 mg Oral BID   pantoprazole  40 mg Oral Daily   sertraline  75 mg Oral Daily   sodium bicarbonate  50 mEq Intravenous Once   valproic acid  500 mg Oral TID   Continuous Infusions:  meropenem (MERREM) IV 500 mg (05/18/23 0921)   sodium  bicarbonate 150 mEq in sterile water 1,150 mL infusion       LOS: 3 days    Time spent: 35 minutes    Briannah Lona A Jaison Petraglia, MD [facility]   If 7PM-7AM, please contact night-coverage [email or web address]  05/18/2023, 10:22 AM

## 2023-05-18 NOTE — Progress Notes (Signed)
   05/18/23 1132  Oxygen Therapy/Pulse Ox  O2 Device (S)  HHFNC  $ High Flow Nasal Cannula  Yes  O2 Therapy Oxygen humidified  Heater temperature (S)  93.2 F (34 C)  O2 Flow Rate (L/min) (S)  20 L/min  FiO2 (%) (S)  45 %  SpO2 92 %  Safety Instructions Yes (Comment)

## 2023-05-18 NOTE — Discharge Instructions (Addendum)

## 2023-05-18 NOTE — Progress Notes (Signed)
 RN aware that patient is more lethargic than yesterday, RN took a set of vital signs that were abnormal, RN made MD aware of situation, MD placed orders to transfer patient to stepdown unit, respiratory therapist and rapid response RN contacted by primary RN, primary RN transferred patient to stepdown unit and gave receiving RN bedside report regarding patient.

## 2023-05-18 NOTE — Progress Notes (Signed)
   05/18/23 1635  Oxygen Therapy/Pulse Ox  O2 Device (S)  HFNC (Discontinued HHFNC and changed to a HFNC [place] 4 L .)  O2 Therapy Oxygen humidified  O2 Flow Rate (L/min) 4 L/min  FiO2 (%) 36 %  SpO2 100 %  Safety Instructions Yes (Comment)

## 2023-05-19 ENCOUNTER — Inpatient Hospital Stay (HOSPITAL_COMMUNITY)
Admit: 2023-05-19 | Discharge: 2023-05-19 | Disposition: A | Discharge status: Home / Self Care | Attending: Internal Medicine

## 2023-05-19 ENCOUNTER — Inpatient Hospital Stay (HOSPITAL_COMMUNITY)

## 2023-05-19 DIAGNOSIS — I4891 Unspecified atrial fibrillation: Secondary | ICD-10-CM | POA: Diagnosis not present

## 2023-05-19 DIAGNOSIS — R4182 Altered mental status, unspecified: Secondary | ICD-10-CM | POA: Diagnosis not present

## 2023-05-19 DIAGNOSIS — I214 Non-ST elevation (NSTEMI) myocardial infarction: Secondary | ICD-10-CM | POA: Diagnosis not present

## 2023-05-19 DIAGNOSIS — R079 Chest pain, unspecified: Secondary | ICD-10-CM

## 2023-05-19 DIAGNOSIS — I2510 Atherosclerotic heart disease of native coronary artery without angina pectoris: Secondary | ICD-10-CM | POA: Diagnosis not present

## 2023-05-19 DIAGNOSIS — J189 Pneumonia, unspecified organism: Secondary | ICD-10-CM | POA: Diagnosis not present

## 2023-05-19 DIAGNOSIS — R569 Unspecified convulsions: Secondary | ICD-10-CM | POA: Diagnosis not present

## 2023-05-19 LAB — GLUCOSE, CAPILLARY
Glucose-Capillary: 119 mg/dL — ABNORMAL HIGH (ref 70–99)
Glucose-Capillary: 188 mg/dL — ABNORMAL HIGH (ref 70–99)
Glucose-Capillary: 107 mg/dL — ABNORMAL HIGH (ref 70–99)
Glucose-Capillary: 196 mg/dL — ABNORMAL HIGH (ref 70–99)

## 2023-05-19 LAB — ECHOCARDIOGRAM COMPLETE
AR max vel: 1.72 cm2
AV Peak grad: 6.5 mmHg
Ao pk vel: 1.28 m/s
Area-P 1/2: 4.89 cm2
Height: 65 in
MV VTI: 1.58 cm2
S' Lateral: 3 cm
Weight: 2553.81 [oz_av]

## 2023-05-19 LAB — CBC
HCT: 29.5 % — ABNORMAL LOW (ref 36.0–46.0)
Hemoglobin: 9.7 g/dL — ABNORMAL LOW (ref 12.0–15.0)
MCH: 30.5 pg (ref 26.0–34.0)
MCHC: 32.9 g/dL (ref 30.0–36.0)
MCV: 92.8 fL (ref 80.0–100.0)
Platelets: 206 10*3/uL (ref 150–400)
RBC: 3.18 MIL/uL — ABNORMAL LOW (ref 3.87–5.11)
RDW: 13.9 % (ref 11.5–15.5)
WBC: 12 10*3/uL — ABNORMAL HIGH (ref 4.0–10.5)
nRBC: 0 % (ref 0.0–0.2)

## 2023-05-19 LAB — TROPONIN I (HIGH SENSITIVITY)
Troponin I (High Sensitivity): 625 ng/L (ref <18)
Troponin I (High Sensitivity): 514 ng/L (ref <18)

## 2023-05-19 LAB — BASIC METABOLIC PANEL
Anion gap: 12 (ref 5–15)
BUN: 38 mg/dL — ABNORMAL HIGH (ref 8–23)
CO2: 23 mmol/L (ref 22–32)
Calcium: 8.2 mg/dL — ABNORMAL LOW (ref 8.9–10.3)
Chloride: 96 mmol/L — ABNORMAL LOW (ref 98–111)
Creatinine, Ser: 1.37 mg/dL — ABNORMAL HIGH (ref 0.44–1.00)
GFR, Estimated: 41 mL/min — ABNORMAL LOW (ref >60)
Glucose, Bld: 117 mg/dL — ABNORMAL HIGH (ref 70–99)
Potassium: 3.5 mmol/L (ref 3.5–5.1)
Sodium: 131 mmol/L — ABNORMAL LOW (ref 135–145)

## 2023-05-19 LAB — APTT
aPTT: 57 s — ABNORMAL HIGH (ref 24–36)
aPTT: 61 s — ABNORMAL HIGH (ref 24–36)

## 2023-05-19 LAB — HEPARIN LEVEL (UNFRACTIONATED): Heparin Unfractionated: >1.1 [IU]/mL — ABNORMAL HIGH (ref 0.30–0.70)

## 2023-05-19 MED ORDER — METOPROLOL TARTRATE 25 MG PO TABS
25.0000 mg | ORAL_TABLET | Freq: Two times a day (BID) | ORAL | Status: DC
  Administered 2023-05-19 – 2023-05-20 (×2): 25 mg via ORAL
  Filled 2023-05-19 (×2): qty 1

## 2023-05-19 MED ORDER — HEPARIN BOLUS VIA INFUSION
1000.0000 [IU] | Freq: Once | INTRAVENOUS | Status: AC
  Administered 2023-05-19: 1000 [IU] via INTRAVENOUS
  Filled 2023-05-19: qty 1000

## 2023-05-19 MED ORDER — PERFLUTREN LIPID MICROSPHERE
1.0000 mL | INTRAVENOUS | Status: AC | PRN
  Administered 2023-05-19: 2 mL via INTRAVENOUS

## 2023-05-19 MED ORDER — DILTIAZEM LOAD VIA INFUSION
15.0000 mg | Freq: Once | INTRAVENOUS | Status: AC
  Administered 2023-05-19: 15 mg via INTRAVENOUS
  Filled 2023-05-19: qty 15

## 2023-05-19 MED ORDER — DILTIAZEM HCL-DEXTROSE 125-5 MG/125ML-% IV SOLN (PREMIX)
5.0000 mg/h | INTRAVENOUS | Status: DC
  Administered 2023-05-19 – 2023-05-20 (×2): 5 mg/h via INTRAVENOUS
  Filled 2023-05-19 (×2): qty 125

## 2023-05-19 NOTE — Progress Notes (Signed)
 EEG complete - results pending

## 2023-05-19 NOTE — Progress Notes (Signed)
 PROGRESS NOTE    Olivia Burnett  ZOX:[id number] DOB: 15-Dec-1950 DOA: 05/14/2023 PCP: System, Provider Not In    Brief Narrative:   Olivia Burnett is a 73 y.o. female with past medical history significant for HTN, DM2, chronic ITP, CAD, psoriatic arthritis, CKD stage IIIa, CVA with right-sided weakness who presented to [facility] ED on 3/21 from [facility] via EMS nausea, vomiting, generalized bodyaches, fever, of cough, shortness of breath, dysuria, and pleuritic chest pain.  Chest pain worse with deep inspiration, productive cough progressing.  In the ED, temperature 99.4 F, HR 96, RR 16, BP 168/74, SpO2 99% on room air.  WBC 10.9, hemoglobin 11.3, platelet count 117.  Sodium 130, potassium 3.9, chloride 103, CO2 19, BUN 24, creatinine 1.16.  Glucose 353.  High sensitive troponin 9.  Lactic acid 1.2.  Beta-hydroxybutyrate acid 0.07.  COVID/influenza/RSV PCR negative.  Chest x-ray with no active cardiopulmonary disease process.  CT angiogram chest with no evidence of significant pulmonary embolism, mild cardiac enlargement, patchy infiltrates in the lungs greater on the right consistent with multifocal pneumonia.  Patient was started on empiric antibiotics, given antiemetics, analgesics and IV fluids.  TRH consulted for admission.  Assessment & Plan:    Acute hypoxic/hypoxemic respiratory failure Multifocal pneumonia Lying to the ED from SNF with productive cough, shortness of breath, fevers.  Patient with WBC count of 10.9.  CT angiogram chest negative for PE but with patchy infiltrates concerning for multifocal pneumonia.  Completed 5-day course of azithromycin. -- WBC 10.9>>12.5>18.1>12.0 -- Blood cultures x 2 3/21: No growth x 4 days -- Blood cultures 3/25: No growth less than 12 hours -- Continue meropenem 1 g IV every 12 hours -- CBC daily  ESBL Klebsiella pneumonia UTI Urinalysis 3/24 with large leukocytes, negative nitrite, rare bacteria, greater than 50 WBCs.   Was initially started on ceftriaxone for multifocal pneumonia as above, urine culture returned back ESBL Klebsiella pneumonia and patient was changed to meropenem on 3/24 -- Meropenem 1 g IV every 12 hours  NSTEMI Given continued pleuritic chest pain, troponin was reordered on 3/25 notably elevated to 1050 in which he was 9 at time of admission.  Previous NSTEMI in 2019 with PCI/DES to left circumflex.  EKG with atrial fibrillation with rate 116 with no concerning ST elevation/depression or T wave inversion. -- Cardiology following, appreciate assistance -- Troponin 9>1050>920>625>514 -- Heparin drip  Paroxysmal atrial fibrillation with RVR CHA2DS2-VASc score = 7. -- Cardiology following, appreciate assistance -- TTE: Pending; previous LVEF normal 2023 -- Cardizem 50 mg IV bolus, followed by Cardizem drip -- Heparin drip  Acute metabolic encephalopathy Etiology likely multifactorial in the setting of multifocal pneumonia, ESBL Klebsiella pneumonia UTI. -- Continue as above for UTI/pneumonia -- EEG: Pending -- Supportive care  Acute renal failure on CKD stage IIIa Acute metabolic acidosis -- Creatinine 1.16>>2.03>1.58, 1.37 -- Continue IV bicarbonate drip -- Avoid nephrotoxins, renal dose all medications -- BMP daily  Type 2 diabetes mellitus, with hyperglycemia Hemoglobin C 8.2% December 2024, not optimally controlled. -- Lantus 15 units subcutaneously daily -- Sensitive SSI for coverage -- CBG before every meal/at bedtime  Essential hypertension -- Metoprolol tartrate 12.5 g p.o. twice daily -- Isosorbide mononitrate 30 mg p.o. daily  Chronic ITP -- Platelet count 206, stable  Psoriatic arthritis -- Plaquenil 200 mg p.o. daily  Multivessel CAD  Cardiac catheterization January 2019 with DES to left circumflex, balloon angioplasty to ramus intermedius, CTO of RCA and noted diffuse LAD disease  40-50%. -- Heparin drip -- Isosorbide mononitrate 30 mg p.o. daily --  Aspirin 81 mg p.o. daily -- Atorvastatin 10 mg p.o. daily  Questionable seizure disorder Tremor, abnormal eye movements Patient reports that she was recently taken off her antiepileptics. -- EEG: Pending --Started on Depakote after conversation with neurology by previous hospitalist Dr Sunnie Nielsen -- Outpatient follow-up with neurology  Depression/anxiety -- Sertraline 75 mg p.o. daily  History of CVA with residual right-sided weakness Currently resident at [facility], SNF, apparently bedbound given significant decline since December 2024 and that apparently bedbound/wheelchair dependent. -- Continue aspirin, statin -- Supportive care -- Plan to return to SNF at time of discharge,  DVT prophylaxis:     Code Status: Limited: Do not attempt resuscitation (DNR) -DNR-LIMITED -Do Not Intubate/DNI  Family Communication: No family present bedside this morning  Disposition Plan:  Level of care: Stepdown Status is: Inpatient Remains inpatient appropriate because: IV heparin, Cardizem drip, IV antibiotics    Consultants:  Cardiology  Procedures:  TTE: Pending EEG: Pending  Antimicrobials:  Ceftriaxone 3/21 - 3/23 Azithromycin 3/22 - 3/26 Meropenem 3/24>>   Subjective: Patient seen examined bedside, lying in bed.  Continues to complain of chest discomfort with deep inspiration, cough.  Remains confused, alert to self and place but not time or situation.  Troponin is trending down, remains on heparin drip, starting Cardizem drip for persistent A-fib with RVR.  No family present at bedside.  No other specific complaints or concerns at this time.  Denies headache, no abdominal pain, no fever/chills, no nausea/vomiting/diarrhea.  Discussed with RN at bedside.  No other acute concerns overnight per nursing staff.  Objective: Vitals:   05/19/23 1130 05/19/23 1155 05/19/23 1200 05/19/23 1205  BP: (!) 141/99   113/81  Pulse: (!) 130 (!) 118 (!) 116 89  Resp: 20 (!) 21 (!) 22 19  Temp:       TempSrc:      SpO2: 93% 96% 97% 96%  Weight:      Height:        Intake/Output Summary (Last 24 hours) at 05/19/2023 1254 Last data filed at 05/19/2023 1128 Gross per 24 hour  Intake 1925.31 ml  Output 830 ml  Net 1095.31 ml   Filed Weights   05/15/23 0010 05/15/23 1100  Weight: 74.8 kg 72.4 kg    Examination:  Physical Exam: GEN: NAD, alert confused; oriented to person/place, chronically ill appearance, appears older than stated age HEENT: NCAT, PERRL, EOMI, sclera clear, MMM PULM: Breath sounds slight diminished bilateral bases, no wheezing/crackles, normal respiratory effort without accessory muscle use, on 4 L nasal cannula with SpO2 94% at rest. CV: RRR w/o M/G/R GI: abd soft, NTND, NABS, no R/G/M MSK: Moves all extremities independently NEURO: Noted rotatory motion of bilateral eyes that is not sustaining when asked to move in certain directions on physical exam, otherwise no other focal neurological deficits appreciated  Integumentary: No concerning rashes/lesions/wounds noted on exposed skin surface.    Data Reviewed: I have personally reviewed following labs and imaging studies  CBC: Recent Labs  Lab 05/14/23 2122 05/15/23 0527 05/16/23 0513 05/17/23 0435 05/18/23 0458 05/19/23 0515  WBC 10.9* 10.9* 12.5* 10.8* 18.1* 12.0*  NEUTROABS 8.5*  --   --   --   --   --   HGB 11.3* 10.3* 10.7* 9.5* 10.6* 9.7*  HCT 32.1* 29.5* 32.1* 28.6* 33.4* 29.5*  MCV 88.7 89.1 90.2 92.3 95.4 92.8  PLT 117* 109* 137* 142* 179 206   Basic  Metabolic Panel: Recent Labs  Lab 05/16/23 0513 05/17/23 0435 05/18/23 0458 05/18/23 1656 05/19/23 0638  NA 135 134* 132* 129* 131*  K 4.2 4.4 4.2 4.1 3.5  CL 106 105 106 98 96*  CO2 18* 17* 14* 18* 23  GLUCOSE 108* 101* 184* 143* 117*  BUN 26* 39* 42* 43* 38*  CREATININE 1.11* 2.03* 1.61* 1.58* 1.37*  CALCIUM 8.4* 8.2* 8.7* 8.3* 8.2*   GFR: Estimated Creatinine Clearance: 36.5 mL/min (A) (by C-G formula based on SCr of 1.37  mg/dL (H)). Liver Function Tests: Recent Labs  Lab 05/14/23 2122  AST 21  ALT 12  ALKPHOS 68  BILITOT 0.7  PROT 7.1  ALBUMIN 4.0   No results for input(s): "LIPASE", "AMYLASE" in the last 168 hours. Recent Labs  Lab 05/15/23 1536  AMMONIA 23   Coagulation Profile: Recent Labs  Lab 05/14/23 2135  INR 1.1   Cardiac Enzymes: No results for input(s): "CKTOTAL", "CKMB", "CKMBINDEX", "TROPONINI" in the last 168 hours. BNP (last 3 results) No results for input(s): "PROBNP" in the last 8760 hours. HbA1C: No results for input(s): "HGBA1C" in the last 72 hours. CBG: Recent Labs  Lab 05/18/23 1702 05/18/23 1940 05/18/23 2113 05/19/23 0731 05/19/23 1122  GLUCAP 133* 140* 157* 119* 188*   Lipid Profile: No results for input(s): "CHOL", "HDL", "LDLCALC", "TRIG", "CHOLHDL", "LDLDIRECT" in the last 72 hours. Thyroid Function Tests: No results for input(s): "TSH", "T4TOTAL", "FREET4", "T3FREE", "THYROIDAB" in the last 72 hours. Anemia Panel: No results for input(s): "VITAMINB12", "FOLATE", "FERRITIN", "TIBC", "IRON", "RETICCTPCT" in the last 72 hours. Sepsis Labs: Recent Labs  Lab 05/14/23 2128 05/18/23 1656 05/18/23 1822  LATICACIDVEN 1.2 2.9* 1.6    Recent Results (from the past 240 hours)  Blood Culture (routine x 2)     Status: None (Preliminary result)   Collection Time: 05/14/23  9:22 PM   Specimen: BLOOD  Result Value Ref Range Status   Specimen Description   Final    BLOOD LEFT ANTECUBITAL Performed at [facility], [address] [place], [place] [address]    Special Requests   Final    BOTTLES DRAWN AEROBIC AND ANAEROBIC Blood Culture results may not be optimal due to an inadequate volume of blood received in culture bottles Performed at [facility], [address], [place], [place] [address]    Culture   Final    NO GROWTH 4 DAYS Performed at [facility], [address], [place], [place] [address]     Report Status PENDING  Incomplete  Blood Culture (routine x 2)     Status: None (Preliminary result)   Collection Time: 05/14/23  9:50 PM   Specimen: BLOOD RIGHT ARM  Result Value Ref Range Status   Specimen Description   Final    BLOOD RIGHT ARM Performed at [facility], [address], [place], [place] [address]    Special Requests   Final    BOTTLES DRAWN AEROBIC AND ANAEROBIC Blood Culture results may not be optimal due to an inadequate volume of blood received in culture bottles Performed at [facility], [address], [place], [place] [address]    Culture   Final    NO GROWTH 4 DAYS Performed at [facility], [address] [place], [place] [address]    Report Status PENDING  Incomplete  Resp panel by RT-PCR (RSV, Flu A&B, Covid) Anterior Nasal Swab     Status: None  Collection Time: 05/14/23 10:00 PM   Specimen: Anterior Nasal Swab  Result Value Ref Range Status   SARS Coronavirus 2 by RT PCR NEGATIVE NEGATIVE Final    Comment: (NOTE) SARS-CoV-2 target nucleic acids are NOT DETECTED.  The SARS-CoV-2 RNA is generally detectable in upper respiratory specimens during the acute phase of infection. The lowest concentration of SARS-CoV-2 viral copies this assay can detect is 138 copies/mL. A negative result does not preclude SARS-Cov-2 infection and should not be used as the sole basis for treatment or other patient management decisions. A negative result may occur with  improper specimen collection/handling, submission of specimen other than nasopharyngeal swab, presence of viral mutation(s) within the areas targeted by this assay, and inadequate number of viral copies(<138 copies/mL). A negative result must be combined with clinical observations, patient history, and epidemiological information. The expected result is Negative.  Fact Sheet for Patients:  BloggerCourse.com  Fact Sheet for Healthcare  Providers:  SeriousBroker.it  This test is no t yet approved or cleared by the [place] FDA and  has been authorized for detection and/or diagnosis of SARS-CoV-2 by FDA under an Emergency Use Authorization (EUA). This EUA will remain  in effect (meaning this test can be used) for the duration of the COVID-19 declaration under Section 564(b)(1) of the Act, 21 U.S.C.section 360bbb-3(b)(1), unless the authorization is terminated  or revoked sooner.       Influenza A by PCR NEGATIVE NEGATIVE Final   Influenza B by PCR NEGATIVE NEGATIVE Final    Comment: (NOTE) The Xpert Xpress SARS-CoV-2/FLU/RSV plus assay is intended as an aid in the diagnosis of influenza from Nasopharyngeal swab specimens and should not be used as a sole basis for treatment. Nasal washings and aspirates are unacceptable for Xpert Xpress SARS-CoV-2/FLU/RSV testing.  Fact Sheet for Patients: BloggerCourse.com  Fact Sheet for Healthcare Providers: SeriousBroker.it  This test is not yet approved or cleared by the [place] FDA and has been authorized for detection and/or diagnosis of SARS-CoV-2 by FDA under an Emergency Use Authorization (EUA). This EUA will remain in effect (meaning this test can be used) for the duration of the COVID-19 declaration under Section 564(b)(1) of the Act, 21 U.S.C. section 360bbb-3(b)(1), unless the authorization is terminated or revoked.     Resp Syncytial Virus by PCR NEGATIVE NEGATIVE Final    Comment: (NOTE) Fact Sheet for Patients: BloggerCourse.com  Fact Sheet for Healthcare Providers: SeriousBroker.it  This test is not yet approved or cleared by the [place] FDA and has been authorized for detection and/or diagnosis of SARS-CoV-2 by FDA under an Emergency Use Authorization (EUA). This EUA will remain in effect (meaning this test can be  used) for the duration of the COVID-19 declaration under Section 564(b)(1) of the Act, 21 U.S.C. section 360bbb-3(b)(1), unless the authorization is terminated or revoked.  Performed at [facility], [address] [place], [place] [address]   Urine Culture     Status: Abnormal   Collection Time: 05/15/23 12:29 AM   Specimen: Urine, Random  Result Value Ref Range Status   Specimen Description   Final    URINE, RANDOM Performed at [facility], [address], [place], [place] [address]    Special Requests   Final    NONE Reflexed from [id number] Performed at [facility], [address] [place], [place] [address]    Culture (A)  Final    >=100,000 COLONIES/mL KLEBSIELLA PNEUMONIAE Confirmed Extended Spectrum Beta-Lactamase Producer (ESBL).  In bloodstream infections from ESBL organisms, carbapenems are preferred over piperacillin/tazobactam. They are shown to have a lower risk of mortality.    Report Status 05/17/2023 FINAL  Final   Organism ID, Bacteria KLEBSIELLA PNEUMONIAE (A)  Final      Susceptibility   Klebsiella pneumoniae - MIC*    AMPICILLIN >=32 RESISTANT Resistant     CEFAZOLIN >=64 RESISTANT Resistant     CEFEPIME >=32 RESISTANT Resistant     CEFTRIAXONE >=64 RESISTANT Resistant     CIPROFLOXACIN <=0.25 SENSITIVE Sensitive     GENTAMICIN <=1 SENSITIVE Sensitive     IMIPENEM <=0.25 SENSITIVE Sensitive     NITROFURANTOIN <=16 SENSITIVE Sensitive     TRIMETH/SULFA <=20 SENSITIVE Sensitive     AMPICILLIN/SULBACTAM 16 INTERMEDIATE Intermediate     PIP/TAZO <=4 SENSITIVE Sensitive ug/mL    * >=100,000 COLONIES/mL KLEBSIELLA PNEUMONIAE  MRSA Next Gen by PCR, Nasal     Status: None   Collection Time: 05/15/23  5:40 PM   Specimen: Nasal Mucosa; Nasal Swab  Result Value Ref Range Status   MRSA by PCR Next Gen NOT DETECTED NOT DETECTED Final    Comment: (NOTE) The GeneXpert MRSA Assay (FDA approved for NASAL  specimens only), is one component of a comprehensive MRSA colonization surveillance program. It is not intended to diagnose MRSA infection nor to guide or monitor treatment for MRSA infections. Test performance is not FDA approved in patients less than 70 years old. Performed at [facility], [address], [place], [place] [address]   MRSA Next Gen by PCR, Nasal     Status: None   Collection Time: 05/18/23 10:59 AM   Specimen: Nasal Mucosa; Nasal Swab  Result Value Ref Range Status   MRSA by PCR Next Gen NOT DETECTED NOT DETECTED Final    Comment: (NOTE) The GeneXpert MRSA Assay (FDA approved for NASAL specimens only), is one component of a comprehensive MRSA colonization surveillance program. It is not intended to diagnose MRSA infection nor to guide or monitor treatment for MRSA infections. Test performance is not FDA approved in patients less than 31 years old. Performed at [facility], [address], [place], [place] [address]   Culture, blood (Routine X 2) w Reflex to ID Panel     Status: None (Preliminary result)   Collection Time: 05/18/23  4:56 PM   Specimen: BLOOD  Result Value Ref Range Status   Specimen Description   Final    BLOOD BLOOD RIGHT HAND Performed at [facility], [address], [place], [place] [address]    Special Requests   Final    BOTTLES DRAWN AEROBIC ONLY Blood Culture results may not be optimal due to an inadequate volume of blood received in culture bottles Performed at [facility], [address], [place], [place] [address]    Culture   Final    NO GROWTH < 12 HOURS Performed at [facility], [address] [place], [place] [address]    Report Status PENDING  Incomplete  Culture, blood (Routine X 2) w Reflex to ID Panel     Status: None (Preliminary result)   Collection Time: 05/18/23  5:03 PM   Specimen: BLOOD  Result Value Ref Range Status   Specimen  Description   Final    BLOOD BLOOD RIGHT HAND Performed at [facility], [address] [place], [place] [address]    Special Requests   Final  BOTTLES DRAWN AEROBIC ONLY Blood Culture results may not be optimal due to an inadequate volume of blood received in culture bottles Performed at [facility], [address] [place], [place] [address]    Culture   Final    NO GROWTH < 12 HOURS Performed at [facility], [address] [place], [place] [address]    Report Status PENDING  Incomplete         Radiology Studies: DG CHEST PORT 1 VIEW Result Date: 05/18/2023 CLINICAL DATA:  Hypoxia. EXAM: PORTABLE CHEST 1 VIEW COMPARISON:  Chest radiograph dated 05/14/2023. FINDINGS: Small left pleural effusion and left lung base atelectasis. Pneumonia is not excluded. The right lung is clear. No pneumothorax. Stable cardiomegaly. No acute osseous pathology. IMPRESSION: Small left pleural effusion and left lung base atelectasis. Electronically Signed   By: Elgie Collard M.D.   On: 05/18/2023 11:46   US RENAL Result Date: 05/17/2023 CLINICAL DATA:  [id number] AKI (acute kidney injury) (HCC) [id number] EXAM: RENAL / URINARY TRACT ULTRASOUND COMPLETE COMPARISON:  None Available. FINDINGS: Right Kidney: Renal measurements: 10.4 x 5.5 x 4.4 cm = volume: 131.5 mL. Echogenicity increased. No mass or hydronephrosis visualized. Left Kidney: Renal measurements: 10.5 x 5.1 x 4 cm = volume: 111.8 mL. Echogenicity increased. No mass or hydronephrosis visualized. Urinary bladder: Appears normal for degree of bladder distention. Other: None. IMPRESSION: Increased parenchymal echogenicity suggestive of renal parenchymal disease. Electronically Signed   By: Tish Frederickson M.D.   On: 05/17/2023 17:28        Scheduled Meds:  aspirin EC  81 mg Oral Daily   atorvastatin  10 mg Oral QHS   Chlorhexidine Gluconate Cloth  6 each Topical Daily   gabapentin  200 mg Oral BID    hydroxychloroquine  200 mg Oral Daily   insulin aspart  0-5 Units Subcutaneous QHS   insulin aspart  0-9 Units Subcutaneous TID WC   insulin glargine  15 Units Subcutaneous QHS   isosorbide mononitrate  30 mg Oral Daily   linaclotide  145 mcg Oral Daily   metoprolol tartrate  12.5 mg Oral BID   pantoprazole  40 mg Oral Daily   sertraline  75 mg Oral Daily   valproic acid  500 mg Oral TID   Continuous Infusions:  diltiazem (CARDIZEM) infusion 5 mg/hr (05/19/23 1155)   heparin 1,000 Units/hr (05/19/23 1128)   meropenem (MERREM) IV Stopped (05/19/23 1034)   sodium bicarbonate 150 mEq in sterile water 1,150 mL infusion Stopped (05/19/23 1034)     LOS: 4 days    Time spent: 52 minutes spent on 05/19/2023 caring for this patient face-to-face including chart review, ordering labs/tests, documenting, discussion with nursing staff, consultants, updating family and interview/physical exam    Alvira Philips [place], DO [facility] Available via Epic secure chat 7am-7pm After these hours, please refer to coverage provider listed on [email or web address] 05/19/2023, 12:54 PM

## 2023-05-19 NOTE — Procedures (Signed)
 Patient Name: SOKHA CRAKER  MRN: [id number]  Epilepsy Attending: Charlsie Quest  Referring Physician/Provider: Alba Cory, MD  Date: 05/19/2023 Duration: 24.22 mins  Patient history: 72yo F with ams. EEG to evaluate for seizure  Level of alertness: Awake  AEDs during EEG study: GBP, VPA  Technical aspects: This EEG study was done with scalp electrodes positioned according to the 10-20 International system of electrode placement. Electrical activity was reviewed with band pass filter of 1-70Hz , sensitivity of 7 uV/mm, display speed of 31mm/sec with a 60Hz  notched filter applied as appropriate. EEG data were recorded continuously and digitally stored.  Video monitoring was available and reviewed as appropriate.  Description: The posterior dominant rhythm consists of 7.5 Hz activity of moderate voltage (25-35 uV) seen predominantly in posterior head regions, symmetric and reactive to eye opening and eye closing. EEG showed intermittent generalized 3 to 6 Hz theta-delta slowing. Hyperventilation and photic stimulation were not performed.      ABNORMALITY - Intermittent slow, generalized   IMPRESSION: This study is suggestive of mild diffuse encephalopathy. No seizures or epileptiform discharges were seen throughout the recording.   Lanaysia Fritchman Annabelle Harman

## 2023-05-19 NOTE — Progress Notes (Signed)
 PHARMACY - ANTICOAGULATION CONSULT NOTE  Pharmacy Consult for heparin Indication: chest pain/ACS  Allergies  Allergen Reactions   Ciprofloxacin     Per MAR   Gemifloxacin     Per MAR   Infliximab Other (See Comments)    Unknown reaction  unknown   Levaquin [Levofloxacin]     Per MAR   Meperidine Other (See Comments)    Unknown reaction  unknown   Mercury     hives   Moxifloxacin     Per MAR   Ofloxacin     Per MAR   Penicillins     rash   Phenobarbital Other (See Comments)    Unknown reaction  unknown   Pregabalin Other (See Comments)    Dry mouth, confusion, incontinence   Prochlorperazine Other (See Comments)    Unknown reaction  unknown    Patient Measurements: Height: 5\' 5"  (165.1 cm) Weight: 72.4 kg (159 lb 9.8 oz) IBW/kg (Calculated) : 57 Heparin Dosing Weight: 71 kg  Vital Signs: Temp: 98.5 F (36.9 C) (03/26 0400) Temp Source: Oral (03/26 0400) BP: 139/92 (03/26 0500) Pulse Rate: 114 (03/26 0500)  Labs: Recent Labs    05/17/23 0435 05/18/23 0458 05/18/23 1656 05/18/23 1815 05/19/23 0515  HGB 9.5* 10.6*  --   --  9.7*  HCT 28.6* 33.4*  --   --  29.5*  PLT 142* 179  --   --  206  APTT  --   --   --   --  57*  HEPARINUNFRC  --   --   --   --  >1.10*  CREATININE 2.03* 1.61* 1.58*  --   --   TROPONINIHS  --   --  1,050* 920*  --     Estimated Creatinine Clearance: 31.6 mL/min (A) (by C-G formula based on SCr of 1.58 mg/dL (H)).   Medical History: Past Medical History:  Diagnosis Date   CAD S/P percutaneous coronary angioplasty 05/15/2023   Chronic ITP (idiopathic thrombocytopenia) (HCC) 05/15/2023   Diabetes mellitus without complication (HCC)    Essential hypertension 05/15/2023   Ganglion cyst    Hypertension    Psoriatic arthritis (HCC) 05/15/2023   Seizures (HCC)    Stroke [facility])      Assessment: 73 year old female presented with dysuria, productive cough, shortness of breath and chest pain. On 3/25 am, patient was noted  to be more lethargic and tachypneic; she was transferred to the stepdown unit. She was found to have new onset afib and started on Eliquis. She received one dose. Later in evening, concern for ACS with elevated troponin and chest pain. Pharmacy consult to manage heparin infusion.  05/19/23 aPTT = 57 (subtherapeutic) with heparin gtt @ 850 units/hr HL > 1.1 remains falsely elevated due to recent DOAC use Hgb = 9.7, Pltc wnl No complications of therapy reported  Goal of Therapy:  Heparin level 0.3-0.7 units/ml aPTT 66-102 seconds Monitor platelets by anticoagulation protocol: Yes   Plan:  -Increase Heparin to 1000 units/hr -Check aPTT 8 hours after rate increase -Daily CBC & heparin level   Maryellen Pile, PharmD Clinical Pharmacist 05/19/2023 6:08 AM

## 2023-05-19 NOTE — Progress Notes (Incomplete)
 PHARMACY - ANTICOAGULATION CONSULT NOTE  Pharmacy Consult for heparin Indication: chest pain/ACS  Allergies  Allergen Reactions   Ciprofloxacin     Per MAR   Gemifloxacin     Per MAR   Infliximab Other (See Comments)    Unknown reaction  unknown   Levaquin [Levofloxacin]     Per MAR   Meperidine Other (See Comments)    Unknown reaction  unknown   Mercury     hives   Moxifloxacin     Per MAR   Ofloxacin     Per MAR   Penicillins     rash   Phenobarbital Other (See Comments)    Unknown reaction  unknown   Pregabalin Other (See Comments)    Dry mouth, confusion, incontinence   Prochlorperazine Other (See Comments)    Unknown reaction  unknown    Patient Measurements: Height: 5\' 5"  (165.1 cm) Weight: 72.4 kg (159 lb 9.8 oz) IBW/kg (Calculated) : 57 Heparin Dosing Weight: Use total body weight 72 kg  Vital Signs: Temp: 99.4 F (37.4 C) (03/26 0731) Temp Source: Oral (03/26 0731) BP: 141/104 (03/26 1022) Pulse Rate: 133 (03/26 1022)  Labs: Recent Labs    05/17/23 0435 05/18/23 0458 05/18/23 1656 05/18/23 1656 05/18/23 1815 05/19/23 0515 05/19/23 0638 05/19/23 0824  HGB 9.5* 10.6*  --   --   --  9.7*  --   --   HCT 28.6* 33.4*  --   --   --  29.5*  --   --   PLT 142* 179  --   --   --  206  --   --   APTT  --   --   --   --   --  57*  --   --   HEPARINUNFRC  --   --   --   --   --  >1.10*  --   --   CREATININE 2.03* 1.61* 1.58*  --   --   --  1.37*  --   TROPONINIHS  --   --  1,050*   < > 920*  --  625* 514*   < > = values in this interval not displayed.    Estimated Creatinine Clearance: 36.5 mL/min (A) (by C-G formula based on SCr of 1.37 mg/dL (H)).   Medical History: Past Medical History:  Diagnosis Date   CAD S/P percutaneous coronary angioplasty 05/15/2023   Chronic ITP (idiopathic thrombocytopenia) (HCC) 05/15/2023   Diabetes mellitus without complication (HCC)    Essential hypertension 05/15/2023   Ganglion cyst    Hypertension     Psoriatic arthritis (HCC) 05/15/2023   Seizures (HCC)    Stroke [facility])      Assessment: 73 year old female presented with dysuria, productive cough, shortness of breath and chest pain. On 3/25 am, patient was noted to be more lethargic and tachypneic; she was transferred to the stepdown unit. She was found to have new onset afib and started on Eliquis. She received one dose. Later in evening, concern for ACS with elevated troponin and chest pain. Pharmacy consult to manage heparin infusion.  Today, 05/19/23 aPTT = *** CBC: Hgb low but stable, Plt WNL No bleeding reported  Dosing with aPTT as heparin level falsely elevated after dose of apixaban.   Goal of Therapy:  Heparin level 0.3-0.7 units/ml aPTT 66-102 seconds Monitor platelets by anticoagulation protocol: Yes   Plan:  Heparin *** Check aPTT 8 hours CBC, heparin level, aPTT daily Once heparin  level and aPTT correlate, can monitor using heparin level only Monitor for signs of bleeding Follow for ability to resume apixaban  Cindi Carbon, PharmD, BCPS Clinical Pharmacist 05/19/2023 10:51 AM

## 2023-05-19 NOTE — Progress Notes (Signed)
 PHARMACY - ANTICOAGULATION CONSULT NOTE  Pharmacy Consult for heparin Indication: chest pain/ACS  Allergies  Allergen Reactions   Ciprofloxacin     Per MAR   Gemifloxacin     Per MAR   Infliximab Other (See Comments)    Unknown reaction  unknown   Levaquin [Levofloxacin]     Per MAR   Meperidine Other (See Comments)    Unknown reaction  unknown   Mercury     hives   Moxifloxacin     Per MAR   Ofloxacin     Per MAR   Penicillins     rash   Phenobarbital Other (See Comments)    Unknown reaction  unknown   Pregabalin Other (See Comments)    Dry mouth, confusion, incontinence   Prochlorperazine Other (See Comments)    Unknown reaction  unknown    Patient Measurements: Height: 5\' 5"  (165.1 cm) Weight: 72.4 kg (159 lb 9.8 oz) IBW/kg (Calculated) : 57 Heparin Dosing Weight: 71 kg  Vital Signs: Temp: 98.5 F (36.9 C) (03/26 1128) Temp Source: Oral (03/26 1128) BP: 113/81 (03/26 1205) Pulse Rate: 89 (03/26 1205)  Labs: Recent Labs    05/17/23 0435 05/18/23 0458 05/18/23 1656 05/18/23 1656 05/18/23 1815 05/19/23 0515 05/19/23 0638 05/19/23 0824 05/19/23 1409  HGB 9.5* 10.6*  --   --   --  9.7*  --   --   --   HCT 28.6* 33.4*  --   --   --  29.5*  --   --   --   PLT 142* 179  --   --   --  206  --   --   --   APTT  --   --   --   --   --  57*  --   --  61*  HEPARINUNFRC  --   --   --   --   --  >1.10*  --   --   --   CREATININE 2.03* 1.61* 1.58*  --   --   --  1.37*  --   --   TROPONINIHS  --   --  1,050*   < > 920*  --  625* 514*  --    < > = values in this interval not displayed.    Estimated Creatinine Clearance: 36.5 mL/min (A) (by C-G formula based on SCr of 1.37 mg/dL (H)).   Medical History: Past Medical History:  Diagnosis Date   CAD S/P percutaneous coronary angioplasty 05/15/2023   Chronic ITP (idiopathic thrombocytopenia) (HCC) 05/15/2023   Diabetes mellitus without complication (HCC)    Essential hypertension 05/15/2023   Ganglion  cyst    Hypertension    Psoriatic arthritis (HCC) 05/15/2023   Seizures (HCC)    Stroke [facility])      Assessment: 73 year old female presented with dysuria, productive cough, shortness of breath and chest pain. On 3/25 am, patient was noted to be more lethargic and tachypneic; she was transferred to the stepdown unit. She was found to have new onset afib and started on Eliquis. She received one dose. Later in evening, concern for ACS with elevated troponin and chest pain. Pharmacy consult to manage heparin infusion.  05/19/23 -aPTT 61 (subtherapeutic) with heparin infusion at 1000 units/hr -HL > 1.1 remains falsely elevated due to recent DOAC use -Hgb = 9.7, Pltc wnl -No complications of therapy noted  Goal of Therapy:  Heparin level 0.3-0.7 units/ml aPTT 66-102 seconds Monitor platelets by anticoagulation  protocol: Yes   Plan:  -Bolus heparin 1000 units -Increase heparin infusion to 1150 units/hr -Check aPTT 8 hours after rate increase -Daily CBC & heparin level   Pricilla Riffle, PharmD, BCPS Clinical Pharmacist 05/19/2023 3:30 PM

## 2023-05-19 NOTE — Consult Note (Addendum)
 Cardiology Consultation   Patient ID: Olivia Burnett MRN: [id number]; DOB: [birth year]  Admit date: 05/14/2023 Date of Consult: 05/19/2023  PCP:  System, Provider Not In   [facility] Providers Cardiologist:  None [facility]   Patient Profile:   Olivia Burnett is a 73 y.o. 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 [place].  History of Present Illness:   Olivia Burnett is a 73 yo with history of multivessel CAD  She is s/pNSTEMI in January 2019 (?[facility])  when she had DES to LCx, balloon angioplasty to the ramus intermedius, CTO of the RCA and diffuse LAD disease 40 to 50%.  Per cath reports she had EF 35 to 40% however surface echocardiogram showed 50%.  Last echocardiogram  February 2023 with EF 55 to 60%.  Last clinic note on May 2024 she was continued on aspirin and Plavix for this as well as her recurrent stroke history.  Also on Imdur for antianginal effect.   She had an ED evaluation in January 2024 for CP with negative workup There was consideration of stress testing if chest pain did not improve.  Does have previously on monitor in 2023 indicating 39 episodes of SVT so has been on beta-blocker for this as well.  Pt admitted from care facility on 05/14/23  for SOB, fever, cough, chest pain wit hcough.   Being treated with broad spectrum ABX for possible multifocal pneumonia  Now meropenem to cover for Klebsiella ESBL.  Due to pleuritic chest pains yesterday, troponins drawn  Initial1050 but have now down trended to 514 now.  On admission it was 9.  She also has had new onset atrial fibrillation, heart rates have been uncontrolled between 110-140.  Also with lactic acid yesterday of 2.9.  She has been started on IV heparin.    Patient also  has had issues with encephalopathy throughout this admission.  Per daughter she has baseline dementia that has worsened more recently and especially throughout this hospitalization.  She lives at a assisted living facility and essentially wheelchair-bound.  Patient reports that she has had pleuritic chest pain that is sharp with some radiation to her left shoulder when she is coughing, at times worse with positional changes.  Worsen taking a deep breath.  She has been given nitroglycerin on a couple occasions with some benefit at times   Reports having some mild chest pain 2 days prior to admission.  Daughter reports that they have been trying to get her to see her primary cardiologist at [facility] however have struggled to get an appointment.  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  However since this time they have noticed significant decline in functional status.  Previously was able to ambulate with a walker 150 feet.  Additionally, daughter reports that she thinks patient has had previous history of atrial fibrillation as this was mentioned at some other point but does not recall any details of this.  Chest x-ray yesterday indicating small pleural effusion.  Sodium 131, creatinine 1.37.  Last troponin was 514.  WBC 12.  Hemoglobin 9.7.  Lactic acid has down trended to 1.6 today.  Past Medical History:  Diagnosis Date  CAD S/P percutaneous coronary angioplasty 05/15/2023   Chronic ITP (idiopathic thrombocytopenia) (HCC) 05/15/2023   Diabetes mellitus without complication (HCC)    Essential hypertension 05/15/2023   Ganglion cyst    Hypertension    Psoriatic arthritis (HCC) 05/15/2023   Seizures (HCC)    Stroke [facility])     Past Surgical History:  Procedure Laterality Date   ABDOMINAL HYSTERECTOMY     APPENDECTOMY     TONSILLECTOMY      Inpatient Medications: Scheduled Meds:  aspirin EC   81 mg Oral Daily   atorvastatin  10 mg Oral QHS   Chlorhexidine Gluconate Cloth  6 each Topical Daily   diltiazem  15 mg Intravenous Once   gabapentin  200 mg Oral BID   hydroxychloroquine  200 mg Oral Daily   insulin aspart  0-5 Units Subcutaneous QHS   insulin aspart  0-9 Units Subcutaneous TID WC   insulin glargine  15 Units Subcutaneous QHS   isosorbide mononitrate  30 mg Oral Daily   linaclotide  145 mcg Oral Daily   metoprolol tartrate  12.5 mg Oral BID   pantoprazole  40 mg Oral Daily   sertraline  75 mg Oral Daily   valproic acid  500 mg Oral TID   Continuous Infusions:  diltiazem (CARDIZEM) infusion 5 mg/hr (05/19/23 1155)   heparin 1,000 Units/hr (05/19/23 1128)   meropenem (MERREM) IV Stopped (05/19/23 1034)   sodium bicarbonate 150 mEq in sterile water 1,150 mL infusion Stopped (05/19/23 1034)   PRN Meds: acetaminophen **OR** acetaminophen, bisacodyl, guaiFENesin, melatonin, nitroGLYCERIN, ondansetron (ZOFRAN) IV, mouth rinse, polyethylene glycol  Allergies:    Allergies  Allergen Reactions   Ciprofloxacin     Per MAR   Gemifloxacin     Per MAR   Infliximab Other (See Comments)    Unknown reaction  unknown   Levaquin [Levofloxacin]     Per MAR   Meperidine Other (See Comments)    Unknown reaction  unknown   Mercury     hives   Moxifloxacin     Per MAR   Ofloxacin     Per MAR   Penicillins     rash   Phenobarbital Other (See Comments)    Unknown reaction  unknown   Pregabalin Other (See Comments)    Dry mouth, confusion, incontinence   Prochlorperazine Other (See Comments)    Unknown reaction  unknown    Social History:   Social History   Socioeconomic History   Marital status: Widowed    Spouse name: Not on file   Number of children: Not on file   Years of education: Not on file   Highest education level: Not on file  Occupational History   Not on file  Tobacco Use   Smoking status: Never   Smokeless tobacco: Not on file  Vaping  Use   Vaping status: Never Used  Substance and Sexual Activity   Alcohol use: No   Drug use: No   Sexual activity: Not Currently  Other Topics Concern   Not on file  Social History Narrative   Not on file   Social Drivers of Health   Financial Resource Strain: Not on file  Food Insecurity: No Food Insecurity (02/09/2023)   Hunger Vital Sign    Worried About Running Out of Food in the Last Year: Never true    Ran Out of Food in the Last Year: Never true  Transportation Needs: No Transportation Needs (02/09/2023)   PRAPARE - Transportation  Lack of Transportation (Medical): No    Lack of Transportation (Non-Medical): No  Physical Activity: Not on file  Stress: Not on file  Social Connections: Not on file  Intimate Partner Violence: Not At Risk (02/09/2023)   Humiliation, Afraid, Rape, and Kick questionnaire    Fear of Current or Ex-Partner: No    Emotionally Abused: No    Physically Abused: No    Sexually Abused: No    Family History:   History reviewed. No pertinent family history.   ROS:  Please see the history of present illness. All other ROS reviewed and negative.     Physical Exam/Data:   Vitals:   05/19/23 0900 05/19/23 1000 05/19/23 1022 05/19/23 1128  BP: (!) 122/99 (!) 141/104 (!) 141/104   Pulse: (!) 156 (!) 163 (!) 133   Resp: 20 (!) 27    Temp:    98.5 F (36.9 C)  TempSrc:    Oral  SpO2: 95% 98%    Weight:      Height:        Intake/Output Summary (Last 24 hours) at 05/19/2023 1155 Last data filed at 05/19/2023 1128 Gross per 24 hour  Intake 2294.33 ml  Output 830 ml  Net 1464.33 ml      05/15/2023   11:00 AM 05/15/2023   12:10 AM 02/10/2023    2:45 PM  Last 3 Weights  Weight (lbs) 159 lb 9.8 oz 164 lb 14.5 oz 164 lb 14.5 oz  Weight (kg) 72.4 kg 74.8 kg 74.8 kg     Body mass index is 26.56 kg/m.  General: Confused, strange eye rolling movement HEENT: normal Neck: no JVD Vascular: No carotid bruits; Distal pulses 2+  bilaterally Cardiac: Irregularly irregular, tachycardic Lungs:  clear to auscultation bilaterally, no wheezing, rhonchi or rales  Abd: soft, nontender, no hepatomegaly  Ext: no edema Musculoskeletal:  No deformities, BUE and BLE strength normal and equal Skin: warm and dry  Neuro:  CNs 2-12 intact, no focal abnormalities noted Psych:  Normal affect   EKG:  The EKG was personally reviewed and demonstrates: Atrial fibrillation heart rate 122 with no acute ST-T wave changes. Telemetry:  Telemetry was personally reviewed and demonstrates: Atrial fibrillation heart rate between 110-140  Relevant CV Studies: Cardiac catheterization March 26, 2017 Diagnostic Summary  Chronic Total Occlusion of the RCA with good collateral filling. Severe  stenosis of the Ramus Branch, Marginal Branch  Otherwise Diffuse non-obstructive coronary artery disease.  Segmental LV dysfunction of posterior wall, inferior wall -severe hypokinesis  LV ejection fraction is 35-40 %  Interventional Summary  Successful PCI - Resolute Onyx Drug Eluting Stent of the ostial 1st Obtuse  Marginal Coronary Artery. Successful PCI - PTCA of the proximal Ramus Coronary  Artery.  Interventional Recommendations  Medical therapy for MI, CAD, LV dysfunction  Dual anti-platelet therapy with Ticagrelor and Aspirin 81 mg for at least 12  months is recommended.  I have reviewed the recent history and physical documentation. I personally  spent 41 minutes continuously monitoring the patient during the administration  of moderate sedation. Pre and post activities have been reviewed. I was  present for the entire procedure.   Signatures   Electronically signed by Cathlean Cower, MD,   [facility] Interventional Physician  on 03-26-2017 11 28   Angiographic Findings   Cardiac Arteries and Lesion Findings  LCx    Lesion on 1st Ob Marg  Ostial.90% stenosis 20 mm length reduced to 0%. Pre    procedure TIMI II flow was  noted. Post Procedure TIMI III  flow was present.    Good run off was present. The lesion was diagnosed as High Risk  C .    Devices used    - [employer] 0.014" X 190 WhisperMS J-Tip PTCI guidewire. Number of passes    1.    - 2.0x12 Euphora. 4 inflation s  to a max pressure of  8 atm.    - 2.25X26 Resolute Onyx.  Primary Device Length  26 mm. 3 inflation s  to    a max pressure of  12 atm.    - 2.25x71mm [place] Euphora. 1 inflation s  to a max pressure of  18 atm.  Ramus    Lesion on Ramus  Proximal subsection.95% stenosis 10 mm length reduced to    10%. Pre procedure TIMI II flow was noted. Post Procedure TIMI III flow was    present. Poor run off was present. The lesion was diagnosed as Low Risk     A .    Devices used    - 2.0x12 [employer]. Length  12 mm.  LMCA    Lesion on LMCA  Distal subsection.10% stenosis 7 mm length .The lesion was    lightly calcified.  LAD    Lesion on Prox LAD  40% stenosis 16 mm length .    Lesion on Dist LAD  50% stenosis 18 mm length .  RCA    Lesion on Prox RCA  95% stenosis 46 mm length .The lesion was diffuse.The    lesion showed with irregular contour.    Lesion on Dist RCA  100% stenosis 10 mm length . Pre procedure TIMI I flow    was noted. Good run off was present. The lesion was diagnosed as High Risk     C .  Cardiac Collaterals    - Moderatecollateral flow from the 2nd Septal to the R PDA.    - Poorcollateral flow from the Dist CX to the 3rd RPL.   Echocardiogram 04/16/2021 The left ventricular size is normal.  There is moderate concentric left ventricular hypertrophy with normal  wall motion, normal systolic function and ejection fraction '55-60%'.  Left ventricular filling pattern is pseudonormal.  The right ventricle is normal size.  There is normal right ventricular wall thickness.  The right ventricular systolic function is mild to moderately reduced.  Right ventricular hypokinesis  The left atrium is mildly dilated.  Lipomatous hypertrophy of the interatrial septum is  noted.  There was insufficient TR detected to calculate RV systolic pressure.  Mild pulmonic valvular regurgitation.  Pericardial fat pad is noted.   Laboratory Data:  High Sensitivity Troponin:   Recent Labs  Lab 05/14/23 2122 05/18/23 1656 05/18/23 1815 05/19/23 0638 05/19/23 0824  TROPONINIHS 9 1,050* 920* 625* 514*     Chemistry Recent Labs  Lab 05/18/23 0458 05/18/23 1656 05/19/23 0638  NA 132* 129* 131*  K 4.2 4.1 3.5  CL 106 98 96*  CO2 14* 18* 23  GLUCOSE 184* 143* 117*  BUN 42* 43* 38*  CREATININE 1.61* 1.58* 1.37*  CALCIUM 8.7* 8.3* 8.2*  GFRNONAA 34* 34* 41*  ANIONGAP 12 13 12     Recent Labs  Lab 05/14/23 2122  PROT 7.1  ALBUMIN 4.0  AST 21  ALT 12  ALKPHOS 68  BILITOT 0.7   Lipids No results for input(s): "CHOL", "TRIG", "HDL", "LABVLDL", "LDLCALC", "CHOLHDL" in the last 168 hours.  Hematology Recent Labs  Lab 05/17/23 [phone] 05/18/23 6578 05/19/23 4696  WBC 10.8* 18.1* 12.0*  RBC 3.10* 3.50* 3.18*  HGB 9.5* 10.6* 9.7*  HCT 28.6* 33.4* 29.5*  MCV 92.3 95.4 92.8  MCH 30.6 30.3 30.5  MCHC 33.2 31.7 32.9  RDW 14.1 13.9 13.9  PLT 142* 179 206   Thyroid No results for input(s): "TSH", "FREET4" in the last 168 hours.  BNP Recent Labs  Lab 05/18/23 1815  BNP 919.6*    DDimer No results for input(s): "DDIMER" in the last 168 hours.   Radiology/Studies:  DG CHEST PORT 1 VIEW Result Date: 05/18/2023 CLINICAL DATA:  Hypoxia. EXAM: PORTABLE CHEST 1 VIEW COMPARISON:  Chest radiograph dated 05/14/2023. FINDINGS: Small left pleural effusion and left lung base atelectasis. Pneumonia is not excluded. The right lung is clear. No pneumothorax. Stable cardiomegaly. No acute osseous pathology. IMPRESSION: Small left pleural effusion and left lung base atelectasis. Electronically Signed   By: Elgie Collard M.D.   On: 05/18/2023 11:46   US RENAL Result Date: 05/17/2023 CLINICAL DATA:  [id number] AKI (acute kidney injury) (HCC) [id number] EXAM: RENAL / URINARY  TRACT ULTRASOUND COMPLETE COMPARISON:  None Available. FINDINGS: Right Kidney: Renal measurements: 10.4 x 5.5 x 4.4 cm = volume: 131.5 mL. Echogenicity increased. No mass or hydronephrosis visualized. Left Kidney: Renal measurements: 10.5 x 5.1 x 4 cm = volume: 111.8 mL. Echogenicity increased. No mass or hydronephrosis visualized. Urinary bladder: Appears normal for degree of bladder distention. Other: None. IMPRESSION: Increased parenchymal echogenicity suggestive of renal parenchymal disease. Electronically Signed   By: Tish Frederickson M.D.   On: 05/17/2023 17:28   CT HEAD WO CONTRAST ( ) Result Date: 05/16/2023 CLINICAL DATA:  Headache.  Increasing frequency or severity. EXAM: CT HEAD WITHOUT CONTRAST TECHNIQUE: Contiguous axial images were obtained from the base of the skull through the vertex without intravenous contrast. RADIATION DOSE REDUCTION: This exam was performed according to the departmental dose-optimization program which includes automated exposure control, adjustment of the mA and/or kV according to patient size and/or use of iterative reconstruction technique. COMPARISON:  MR head 02/09/2023. FINDINGS: Brain: Advanced atrophy and white matter disease is again noted. Remote lacunar infarcts of the basal ganglia and thalami bilaterally are stable. Extensive periventricular and subcortical white matter disease is stable bilaterally. Remote lacunar infarcts of the left corona radiata are stable. A remote infarct is present in the posteromedial left occipital lobe. The ventricles are proportionate to the degree of atrophy. No significant extraaxial fluid collection is present. Remote lacunar infarcts of the cerebellum bilaterally are stable. No acute infarcts are present. Remote infarcts are present in the mid brain. Vascular: Extensive atherosclerotic changes are present within the cavernous internal carotid arteries bilaterally and at the dural margin of the left vertebral artery. No hyperdense  vessel is present. Skull: Calvarium is intact. No focal lytic or blastic lesions are present. No significant extracranial soft tissue lesion is present. Sinuses/Orbits: The paranasal sinuses and mastoid air cells are clear. Bilateral lens replacements are noted. Globes and orbits are otherwise unremarkable. IMPRESSION: 1. No acute intracranial abnormality or significant interval change. 2. Advanced atrophy and white matter disease likely reflects the sequela of chronic microvascular ischemia. 3. Multiple remote infarcts as described are stable. Electronically Signed   By: Marin Roberts M.D.   On: 05/16/2023 20:01     Assessment and Plan:   NSTEMI In the setting of multifocal pneumonia/sepsis   She has been complaining of pleuritic chest pain, not clearly angina   EKG with no acute ST-T wave changes.  Troponins downtrending from yesterday  [phone].    Echo today shows LVEF and RVEF normal    No wal motion abormalities   LA enlarged   Suspect current presentation likely representing demand ischemia given sepsis and A-fib RVR.  She has extensive CAD including a CTO of RCA   Would make interpretation of stress test difficult    For now would not pursue more invasive evaluation unless develops more typical symtpoms  Keep on heparin for today.    For now  Continue with IV heparin will treat for at least 48 hours  Multivessel CAD  Last catheterization was in January 2019 where she had DES to LCx and balloon angioplasty to her ramus intermedius.  Had CTO of the RCA and diffuse LAD disease 40 to 50%.  Will reassess when recovers from this acute illness Continue with aspirin, Imdur 30 mg.  On atorvastatin 10 mg, last LDL around 80.  Will check lipid panel here.   New onset A-fib RVR Uncontrolled rates between 110-140, euvolemic.  On IV heparin, watch platelets closely Agree with dilt 15mg  push and starting IV dilt drip   Also on lopressor 12.5mg  BID.  Check TSH.  Follow BP    Chronic ITP Platelets 206.-Stable during this admission.  Monitor closely  Recurrent stroke Per previous notes has been on aspirin Plavix chronically for this but do not see that she is on Plavix.  HOld with heparin    Seizures  Has strange eye movement. On Depakote.    Risk Assessment/Risk Scores:   TIMI Risk Score for Unstable Angina or Non-ST Elevation MI:   The patient's TIMI risk score is 6, which indicates a 41% risk of all cause mortality, new or recurrent myocardial infarction or need for urgent revascularization in the next 14 days.{  CHA2DS2-VASc Score = 7  This indicates a 11.2% annual risk of stroke. The patient's score is based upon: CHF History: 0 HTN History: 1 Diabetes History: 1 Stroke History: 2 Vascular Disease History: 1 Age Score: 1 Gender Score: 1   For questions or updates, please contact [facility] Please consult [email or web address] for contact info under    Signed, Olivia Kitchens, PA-C  05/19/2023 11:55 AM   PT seen and examined   I have amended note above by Thamas Jaegers to reflect my findings  Pt is 73 yo with hx of CAD, ITP, CVA, seizures  dementia   Admitted from care faciltty with cough, SOB, pleuritic  CP and fevers. Being treated for pneumonia/infection    Yesterday pt hypoxic, acidotic  She was  transferred to step down   She  developed atria fibrillation Troponin 1000   Lactic acid 2.9     Pt switched to IV heparin from Eliquis   History is difficult    She has some L shoulder pain and pleuritic pains in chest    Daughter says NSTEMI came on suddenly in 2019   Pt doesn't remember   Currently in afib   Rates 100s   Neck   Full   Unable to judge JVP Lung are relatively clear Cardiac exam  Irreg irreg   No S3 Abd is benign  Obese Ext with tra edema   Impression    NSTEMI   May indeed reflect demand in setting of significant CAD and hypoxia/acidosis.   As noted above noninvasive imaging for stress would be diffcult to interpret with  CTO of RCA    Echo today-- LVEF is normal   No wall motion abnormalities  Recomm  continuing medical Rx for infection, pulmonary and atrial fibrillaltion  Keep on heparin today  Atrial fibrillation    Continue heparin   ON diltiazem gtt   Will increase metoprolol   Will follow up in am    Dietrich Pates MD

## 2023-05-20 DIAGNOSIS — J189 Pneumonia, unspecified organism: Secondary | ICD-10-CM | POA: Diagnosis not present

## 2023-05-20 LAB — MAGNESIUM: Magnesium: 2.8 mg/dL — ABNORMAL HIGH (ref 1.7–2.4)

## 2023-05-20 LAB — CBC
HCT: 28.6 % — ABNORMAL LOW (ref 36.0–46.0)
Hemoglobin: 9.4 g/dL — ABNORMAL LOW (ref 12.0–15.0)
MCH: 30.3 pg (ref 26.0–34.0)
MCHC: 32.9 g/dL (ref 30.0–36.0)
MCV: 92.3 fL (ref 80.0–100.0)
Platelets: 225 10*3/uL (ref 150–400)
RBC: 3.1 MIL/uL — ABNORMAL LOW (ref 3.87–5.11)
RDW: 13.8 % (ref 11.5–15.5)
WBC: 14.6 10*3/uL — ABNORMAL HIGH (ref 4.0–10.5)
nRBC: 0 % (ref 0.0–0.2)

## 2023-05-20 LAB — BASIC METABOLIC PANEL WITH GFR
Anion gap: 9 (ref 5–15)
BUN: 31 mg/dL — ABNORMAL HIGH (ref 8–23)
CO2: 25 mmol/L (ref 22–32)
Calcium: 8.1 mg/dL — ABNORMAL LOW (ref 8.9–10.3)
Chloride: 96 mmol/L — ABNORMAL LOW (ref 98–111)
Creatinine, Ser: 1.37 mg/dL — ABNORMAL HIGH (ref 0.44–1.00)
GFR, Estimated: 41 mL/min — ABNORMAL LOW (ref >60)
Glucose, Bld: 181 mg/dL — ABNORMAL HIGH (ref 70–99)
Potassium: 3.6 mmol/L (ref 3.5–5.1)
Sodium: 130 mmol/L — ABNORMAL LOW (ref 135–145)

## 2023-05-20 LAB — TSH: TSH: 0.519 u[IU]/mL (ref 0.350–4.500)

## 2023-05-20 LAB — GLUCOSE, CAPILLARY
Glucose-Capillary: 155 mg/dL — ABNORMAL HIGH (ref 70–99)
Glucose-Capillary: 201 mg/dL — ABNORMAL HIGH (ref 70–99)
Glucose-Capillary: 205 mg/dL — ABNORMAL HIGH (ref 70–99)
Glucose-Capillary: 225 mg/dL — ABNORMAL HIGH (ref 70–99)

## 2023-05-20 LAB — CULTURE, BLOOD (ROUTINE X 2)
Culture: NO GROWTH
Culture: NO GROWTH

## 2023-05-20 LAB — HEPARIN LEVEL (UNFRACTIONATED)
Heparin Unfractionated: 0.28 [IU]/mL — ABNORMAL LOW (ref 0.30–0.70)
Heparin Unfractionated: 0.67 [IU]/mL (ref 0.30–0.70)
Heparin Unfractionated: 0.76 [IU]/mL — ABNORMAL HIGH (ref 0.30–0.70)

## 2023-05-20 LAB — PHOSPHORUS: Phosphorus: 2.5 mg/dL (ref 2.5–4.6)

## 2023-05-20 LAB — APTT: aPTT: 78 s — ABNORMAL HIGH (ref 24–36)

## 2023-05-20 MED ORDER — METOPROLOL TARTRATE 25 MG PO TABS
25.0000 mg | ORAL_TABLET | Freq: Four times a day (QID) | ORAL | Status: DC
  Administered 2023-05-20 – 2023-05-22 (×7): 25 mg via ORAL
  Filled 2023-05-20 (×7): qty 1

## 2023-05-20 NOTE — Consult Note (Signed)
 Rounding Note    Patient Name: ERIC MORGANTI Date of Encounter: 05/20/2023  [facility] Cardiologist:   Subjective   No CP  L shoulder pain   Breathing is fair   Inpatient Medications    Scheduled Meds:  aspirin EC  81 mg Oral Daily   atorvastatin  10 mg Oral QHS   Chlorhexidine Gluconate Cloth  6 each Topical Daily   gabapentin  200 mg Oral BID   hydroxychloroquine  200 mg Oral Daily   insulin aspart  0-5 Units Subcutaneous QHS   insulin aspart  0-9 Units Subcutaneous TID WC   insulin glargine  15 Units Subcutaneous QHS   isosorbide mononitrate  30 mg Oral Daily   linaclotide  145 mcg Oral Daily   metoprolol tartrate  25 mg Oral BID   pantoprazole  40 mg Oral Daily   sertraline  75 mg Oral Daily   valproic acid  500 mg Oral TID   Continuous Infusions:  diltiazem (CARDIZEM) infusion 5 mg/hr (05/20/23 0524)   heparin 1,150 Units/hr (05/19/23 1703)   meropenem (MERREM) IV Stopped (05/20/23 0004)   PRN Meds: acetaminophen **OR** acetaminophen, bisacodyl, guaiFENesin, melatonin, nitroGLYCERIN, ondansetron (ZOFRAN) IV, mouth rinse, polyethylene glycol   Vital Signs    Vitals:   05/19/23 2200 05/19/23 2228 05/20/23 0000 05/20/23 0400  BP:      Pulse:  97    Resp: (!) 21 20    Temp:   99.1 F (37.3 C) 98.2 F (36.8 C)  TempSrc:   Oral Axillary  SpO2: 93% 94%    Weight:      Height:        Intake/Output Summary (Last 24 hours) at 05/20/2023 0651 Last data filed at 05/19/2023 1701 Gross per 24 hour  Intake 311.05 ml  Output 1080 ml  Net -768.95 ml    2 L positive      05/15/2023   11:00 AM 05/15/2023   12:10 AM 02/10/2023    2:45 PM  Last 3 Weights  Weight (lbs) 159 lb 9.8 oz 164 lb 14.5 oz 164 lb 14.5 oz  Weight (kg) 72.4 kg 74.8 kg 74.8 kg      Telemetry    Afob  80s to 120s  - Personally Reviewed  ECG     - Personally Reviewed  Physical Exam   GEN: Obese 73 yo in no acute distress.   Neck: Neck is full  Cardiac  Irreg irreg   Respiratory: Clear to auscultation bilaterally. GI: Soft, nontender, non-distended  MS: Tr t o1+ LE dema; No deformity.   Labs    High Sensitivity Troponin:   Recent Labs  Lab 05/14/23 2122 05/18/23 1656 05/18/23 1815 05/19/23 0638 05/19/23 0824  TROPONINIHS 9 1,050* 920* 625* 514*     Chemistry Recent Labs  Lab 05/14/23 2122 05/15/23 0527 05/18/23 1656 05/19/23 0638 05/20/23 0012  NA 130*   < > 129* 131* 130*  K 3.9   < > 4.1 3.5 3.6  CL 103   < > 98 96* 96*  CO2 19*   < > 18* 23 25  GLUCOSE 353*   < > 143* 117* 181*  BUN 24*   < > 43* 38* 31*  CREATININE 1.16*   < > 1.58* 1.37* 1.37*  CALCIUM 8.6*   < > 8.3* 8.2* 8.1*  MG  --   --   --   --  2.8*  PROT 7.1  --   --   --   --  ALBUMIN 4.0  --   --   --   --   AST 21  --   --   --   --   ALT 12  --   --   --   --   ALKPHOS 68  --   --   --   --   BILITOT 0.7  --   --   --   --   GFRNONAA 50*   < > 34* 41* 41*  ANIONGAP 8   < > 13 12 9    < > = values in this interval not displayed.    Lipids No results for input(s): "CHOL", "TRIG", "HDL", "LABVLDL", "LDLCALC", "CHOLHDL" in the last 168 hours.  Hematology Recent Labs  Lab 05/18/23 0458 05/19/23 0515 05/20/23 0012  WBC 18.1* 12.0* 14.6*  RBC 3.50* 3.18* 3.10*  HGB 10.6* 9.7* 9.4*  HCT 33.4* 29.5* 28.6*  MCV 95.4 92.8 92.3  MCH 30.3 30.5 30.3  MCHC 31.7 32.9 32.9  RDW 13.9 13.9 13.8  PLT 179 206 225   Thyroid  Recent Labs  Lab 05/20/23 0012  TSH 0.519    BNP Recent Labs  Lab 05/18/23 1815  BNP 919.6*    DDimer No results for input(s): "DDIMER" in the last 168 hours.   Radiology    EEG adult Result Date: 05/19/2023 Charlsie Quest, MD     05/19/2023  8:35 PM Patient Name: MARTHA ELLERBY MRN: [id number] Epilepsy Attending: Charlsie Quest Referring Physician/Provider: Alba Cory, MD Date: 05/19/2023 Duration: 24.22 mins Patient history: 72yo F with ams. EEG to evaluate for seizure Level of alertness: Awake AEDs during EEG study:  GBP, VPA Technical aspects: This EEG study was done with scalp electrodes positioned according to the 10-20 International system of electrode placement. Electrical activity was reviewed with band pass filter of 1-70Hz , sensitivity of 7 uV/mm, display speed of 9mm/sec with a 60Hz  notched filter applied as appropriate. EEG data were recorded continuously and digitally stored.  Video monitoring was available and reviewed as appropriate. Description: The posterior dominant rhythm consists of 7.5 Hz activity of moderate voltage (25-35 uV) seen predominantly in posterior head regions, symmetric and reactive to eye opening and eye closing. EEG showed intermittent generalized 3 to 6 Hz theta-delta slowing. Hyperventilation and photic stimulation were not performed.    ABNORMALITY - Intermittent slow, generalized  IMPRESSION: This study is suggestive of mild diffuse encephalopathy. No seizures or epileptiform discharges were seen throughout the recording.  Charlsie Quest   ECHOCARDIOGRAM COMPLETE Result Date: 05/19/2023    ECHOCARDIOGRAM REPORT   Patient Name:   FAITH PATRICELLI Date of Exam: 05/19/2023 Medical Rec #:  [id number]         Height:       65.0 in Accession #:    [id number]        Weight:       159.6 lb Date of Birth:  [birth year]         BSA:          1.797 m Patient Age:    72 years          BP:           113/81 mmHg Patient Gender: F                 HR:           88 bpm. Exam Location:  Inpatient Procedure: 2D Echo, Cardiac Doppler, Color Doppler and Intracardiac  Opacification Agent (Both Spectral and Color Flow Doppler were            utilized during procedure). Indications:    Chest Pain  History:        Patient has prior history of Echocardiogram examinations. CAD,                 Stroke; Risk Factors:Diabetes and Hypertension.  Sonographer:    Lamont Snowball Referring Phys: [id number] ERIC J [place]  Sonographer Comments: Patient is obese. Image acquisition challenging due to respiratory motion.  IMPRESSIONS  1. Left ventricular ejection fraction, by estimation, is 60 to 65%. The left ventricle has normal function. The left ventricle has no regional wall motion abnormalities. There is mild left ventricular hypertrophy. Left ventricular diastolic parameters are indeterminate.  2. Right ventricular systolic function is normal. The right ventricular size is normal.  3. Left atrial size was moderately dilated.  4. The mitral valve is normal in structure. No evidence of mitral valve regurgitation. No evidence of mitral stenosis.  5. The aortic valve is normal in structure. Aortic valve regurgitation is not visualized. No aortic stenosis is present.  6. Aortic dilatation noted. There is mild dilatation of the ascending aorta, measuring 41 mm.  7. The inferior vena cava is dilated in size with <50% respiratory variability, suggesting right atrial pressure of 15 mmHg. FINDINGS  Left Ventricle: Left ventricular ejection fraction, by estimation, is 60 to 65%. The left ventricle has normal function. The left ventricle has no regional wall motion abnormalities. The left ventricular internal cavity size was normal in size. There is  mild left ventricular hypertrophy. Left ventricular diastolic parameters are indeterminate. Right Ventricle: The right ventricular size is normal. No increase in right ventricular wall thickness. Right ventricular systolic function is normal. Left Atrium: Left atrial size was moderately dilated. Right Atrium: Right atrial size was normal in size. Pericardium: There is no evidence of pericardial effusion. Mitral Valve: The mitral valve is normal in structure. No evidence of mitral valve regurgitation. No evidence of mitral valve stenosis. MV peak gradient, 6.9 mmHg. The mean mitral valve gradient is 3.0 mmHg. Tricuspid Valve: The tricuspid valve is normal in structure. Tricuspid valve regurgitation is not demonstrated. No evidence of tricuspid stenosis. Aortic Valve: The aortic valve is normal  in structure. Aortic valve regurgitation is not visualized. No aortic stenosis is present. Aortic valve peak gradient measures 6.5 mmHg. Pulmonic Valve: The pulmonic valve was normal in structure. Pulmonic valve regurgitation is trivial. No evidence of pulmonic stenosis. Aorta: Aortic dilatation noted. There is mild dilatation of the ascending aorta, measuring 41 mm. Venous: The inferior vena cava is dilated in size with less than 50% respiratory variability, suggesting right atrial pressure of 15 mmHg. IAS/Shunts: No atrial level shunt detected by color flow Doppler.  LEFT VENTRICLE PLAX 2D LVIDd:         4.60 cm LVIDs:         3.00 cm LV PW:         1.10 cm LV IVS:        1.20 cm LVOT diam:     2.00 cm LV SV:         34 LV SV Index:   19 LVOT Area:     3.14 cm  RIGHT VENTRICLE          IVC RV Basal diam:  3.00 cm  IVC diam: 2.50 cm LEFT ATRIUM  Index        RIGHT ATRIUM           Index LA diam:        4.10 cm 2.28 cm/m   RA Area:     13.10 cm LA Vol (A2C):   85.7 ml 47.68 ml/m  RA Volume:   28.30 ml  15.75 ml/m LA Vol (A4C):   72.6 ml 40.39 ml/m LA Biplane Vol: 80.0 ml 44.51 ml/m  AORTIC VALVE AV Area (Vmax): 1.72 cm AV Vmax:        127.67 cm/s AV Peak Grad:   6.5 mmHg LVOT Vmax:      69.97 cm/s LVOT Vmean:     48.133 cm/s LVOT VTI:       0.109 m  AORTA Ao Root diam: 2.90 cm Ao Asc diam:  4.10 cm MITRAL VALVE MV Area (PHT): 4.89 cm     SHUNTS MV Area VTI:   1.58 cm     Systemic VTI:  0.11 m MV Peak grad:  6.9 mmHg     Systemic Diam: 2.00 cm MV Mean grad:  3.0 mmHg MV Vmax:       1.32 m/s MV Vmean:      80.6 cm/s MV E velocity: 113.00 cm/s Donato Schultz MD Electronically signed by Donato Schultz MD Signature Date/Time: 05/19/2023/4:11:30 PM    Final    DG CHEST PORT 1 VIEW Result Date: 05/18/2023 CLINICAL DATA:  Hypoxia. EXAM: PORTABLE CHEST 1 VIEW COMPARISON:  Chest radiograph dated 05/14/2023. FINDINGS: Small left pleural effusion and left lung base atelectasis. Pneumonia is not excluded. The  right lung is clear. No pneumothorax. Stable cardiomegaly. No acute osseous pathology. IMPRESSION: Small left pleural effusion and left lung base atelectasis. Electronically Signed   By: Elgie Collard M.D.   On: 05/18/2023 11:46    Cardiac Studies    Echo   March 26/2025  1. Left ventricular ejection fraction, by estimation, is 60 to 65%. The  left ventricle has normal function. The left ventricle has no regional  wall motion abnormalities. There is mild left ventricular hypertrophy.  Left ventricular diastolic parameters  are indeterminate.   2. Right ventricular systolic function is normal. The right ventricular  size is normal.   3. Left atrial size was moderately dilated.   4. The mitral valve is normal in structure. No evidence of mitral valve  regurgitation. No evidence of mitral stenosis.   5. The aortic valve is normal in structure. Aortic valve regurgitation is  not visualized. No aortic stenosis is present.   6. Aortic dilatation noted. There is mild dilatation of the ascending  aorta, measuring 41 mm.   7. The inferior vena cava is dilated in size with <50% respiratory  variability, suggesting right atrial pressure of 15 mmHg.   Cardiac catheterization 03/14/2017 Diagnostic Summary  Chronic Total Occlusion of the RCA with good collateral filling. Severe  stenosis of the Ramus Branch, Marginal Branch  Otherwise Diffuse non-obstructive coronary artery disease.  Segmental LV dysfunction of posterior wall, inferior wall -severe hypokinesis  LV ejection fraction is 35-40 %  Interventional Summary  Successful PCI - Resolute Onyx Drug Eluting Stent of the ostial 1st Obtuse  Marginal Coronary Artery. Successful PCI - PTCA of the proximal Ramus Coronary  Artery.  Interventional Recommendations  Medical therapy for MI, CAD, LV dysfunction  Dual anti-platelet therapy with Ticagrelor and Aspirin 81 mg for at least 12  Patient Profile      EREN PUEBLA is a  73 y.o.  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 [place].   Assessment & Plan    1  NSTEMI / CAD   PTwith known CAD  Presented with multifocal pneumonia/sepsis   Pleuritic CP   Developed hypoxia, afib with RVR     Trop elevated   1050 peak    Echo yesterday LVEF normal   No wall motion abnormalities   Most likely represents demand ischemia in setting of known signficant CAD and sepsis and afib with RVR    On heparin   Continue through today      2 Atrial fibrillation Rates still high at times      ON IV diltiazem  Will increase metoprolol to 25 q 6 hours  Continue heparin today   3  HL   Keep on lipitor     3  Hx CVA  Pt had been on ASA and Plaavix   Curr on heparin and ASA    For questions or updates, please contact  [facility] Please consult [email or web address] for contact info under        Signed, Dietrich Pates, MD  05/20/2023, 6:51 AM

## 2023-05-20 NOTE — Progress Notes (Signed)
 PHARMACY - ANTICOAGULATION CONSULT NOTE  Pharmacy Consult for heparin Indication: chest pain/ACS  Allergies  Allergen Reactions   Ciprofloxacin     Per MAR   Gemifloxacin     Per MAR   Infliximab Other (See Comments)    Unknown reaction  unknown   Levaquin [Levofloxacin]     Per MAR   Meperidine Other (See Comments)    Unknown reaction  unknown   Mercury     hives   Moxifloxacin     Per MAR   Ofloxacin     Per MAR   Penicillins     rash   Phenobarbital Other (See Comments)    Unknown reaction  unknown   Pregabalin Other (See Comments)    Dry mouth, confusion, incontinence   Prochlorperazine Other (See Comments)    Unknown reaction  unknown    Patient Measurements: Height: 5\' 5"  (165.1 cm) Weight: 72.4 kg (159 lb 9.8 oz) IBW/kg (Calculated) : 57 Heparin Dosing Weight: 71 kg  Vital Signs: Temp: 99 F (37.2 C) (03/27 2000) Temp Source: Axillary (03/27 2000) BP: 101/74 (03/27 2000) Pulse Rate: 104 (03/27 2000)  Labs: Recent Labs    05/18/23 0458 05/18/23 0458 05/18/23 1656 05/18/23 1815 05/19/23 0515 05/19/23 1610 05/19/23 0824 05/19/23 1409 05/20/23 0012 05/20/23 0819 05/20/23 1835  HGB 10.6*  --   --   --  9.7*  --   --   --  9.4*  --   --   HCT 33.4*  --   --   --  29.5*  --   --   --  28.6*  --   --   PLT 179  --   --   --  206  --   --   --  225  --   --   APTT  --   --   --   --  57*  --   --  61* 78*  --   --   HEPARINUNFRC  --    < >  --   --  >1.10*  --   --   --  0.67 0.76* 0.28*  CREATININE 1.61*  --  1.58*  --   --  1.37*  --   --  1.37*  --   --   TROPONINIHS  --    < > 1,050* 920*  --  625* 514*  --   --   --   --    < > = values in this interval not displayed.    Estimated Creatinine Clearance: 36.5 mL/min (A) (by C-G formula based on SCr of 1.37 mg/dL (H)).  Assessment: 73 year old female presented with dysuria, productive cough, shortness of breath and chest pain. On 3/25 am, patient was noted to be more lethargic and  tachypneic; she was transferred to the stepdown unit. She was found to have new onset afib and started on Eliquis. She received one dose. Later in evening, concern for ACS with elevated troponin and chest pain. Pharmacy consult to manage heparin infusion.  05/20/23 HL now slightly subtherapeutic at 0.28 on 950 units/hr (previously borderline high on same rate) Hgb low but stable; Plt stable WNL SCr downtrending but remains above admission level No bleeding or infusion issues per RN  Goal of Therapy:  Heparin level 0.3-0.7 units/ml Monitor platelets by anticoagulation protocol: Yes   Plan:  increase heparin infusion to 1050 units/hr Check HL 8 hours after rate change Daily CBC & heparin level  Adalberto Cole, PharmD, BCPS 05/20/2023 8:35 PM

## 2023-05-20 NOTE — Progress Notes (Signed)
 PROGRESS NOTE    Olivia Burnett  ZOX:[id number] DOB: Dec 11, 1950 DOA: 05/14/2023 PCP: System, Provider Not In    Brief Narrative:   Olivia Burnett is a 73 y.o. female with past medical history significant for HTN, DM2, chronic ITP, CAD, psoriatic arthritis, CKD stage IIIa, CVA with right-sided weakness who presented to [facility] ED on 3/21 from [facility] SN[facility] via EMS nausea, vomiting, generalized bodyaches, fever, of cough, shortness of breath, dysuria, and pleuritic chest pain.  Chest pain worse with deep inspiration, productive cough progressing.  In the ED, temperature 99.4 F, HR 96, RR 16, BP 168/74, SpO2 99% on room air.  WBC 10.9, hemoglobin 11.3, platelet count 117.  Sodium 130, potassium 3.9, chloride 103, CO2 19, BUN 24, creatinine 1.16.  Glucose 353.  High sensitive troponin 9.  Lactic acid 1.2.  Beta-hydroxybutyrate acid 0.07.  COVID/influenza/RSV PCR negative.  Chest x-ray with no active cardiopulmonary disease process.  CT angiogram chest with no evidence of significant pulmonary embolism, mild cardiac enlargement, patchy infiltrates in the lungs greater on the right consistent with multifocal pneumonia.  Patient was started on empiric antibiotics, given antiemetics, analgesics and IV fluids.  TRH consulted for admission.  Assessment & Plan:    Acute hypoxic/hypoxemic respiratory failure Multifocal pneumonia Lying to the ED from SNF with productive cough, shortness of breath, fevers.  Patient with WBC count of 10.9.  CT angiogram chest negative for PE but with patchy infiltrates concerning for multifocal pneumonia.  Completed 5-day course of azithromycin. -- WBC 10.9>>12.5>18.1>12.0>14.6 -- Blood cultures x 2 3/21: No growth x 4 days -- Blood cultures 3/25: No growth less than 12 hours -- Continue meropenem 1 g IV every 12 hours -- CBC daily  ESBL Klebsiella pneumonia UTI Urinalysis 3/24 with large leukocytes, negative nitrite, rare bacteria, greater than 50  WBCs.  Was initially started on ceftriaxone for multifocal pneumonia as above, urine culture returned back ESBL Klebsiella pneumonia and patient was changed to meropenem on 3/24 -- Meropenem 1 g IV every 12 hours x 7 days (End 4/1)  NSTEMI Given continued pleuritic chest pain, troponin was reordered on 3/25 notably elevated to 1050 in which he was 9 at time of admission.  Previous NSTEMI in 2019 with PCI/DES to left circumflex.  EKG with atrial fibrillation with rate 116 with no concerning ST elevation/depression or T wave inversion. -- Cardiology following, appreciate assistance -- Troponin 9>1050>920>625>514 -- Heparin drip  Paroxysmal atrial fibrillation with RVR CHA2DS2-VASc score = 7.  TTE with LVEF 60 to 65%, no LV regional wall motion abnormalities, mild LVH, LA moderately dilated, noted ascending aortic aneurysm 41 mm, IVC dilated. -- Cardiology following, appreciate assistance -- metoprolol tartrate 25 mg PO BID -- Cardizem drip -- Heparin drip -- Continue monitor on telemetry  Ascending aortic aneurysm TTE with noted ascending aortic aneurysm 41 mm.  Continue outpatient surveillance.  Acute metabolic encephalopathy Etiology likely multifactorial in the setting of multifocal pneumonia, ESBL Klebsiella pneumonia UTI. -- Continue as above for UTI/pneumonia -- EEG: With no signs of epileptiform or seizure activity -- Supportive care  Acute renal failure on CKD stage IIIa Acute metabolic acidosis -- Creatinine 1.16>>2.03>1.58>1.37>1.37 -- Continue IV bicarbonate drip -- Avoid nephrotoxins, renal dose all medications -- BMP daily  Type 2 diabetes mellitus, with hyperglycemia Hemoglobin C 8.2% December 2024, not optimally controlled. -- Lantus 15 units subcutaneously daily -- Sensitive SSI for coverage -- CBG before every meal/at bedtime  Essential hypertension -- Metoprolol tartrate 25 mg p.o. twice  daily -- Isosorbide mononitrate 30 mg p.o. daily  Chronic ITP --  Platelet count 206, stable  Psoriatic arthritis -- Plaquenil 200 mg p.o. daily  Multivessel CAD  Cardiac catheterization January 2019 with DES to left circumflex, balloon angioplasty to ramus intermedius, CTO of RCA and noted diffuse LAD disease 40-50%. -- Heparin drip -- Isosorbide mononitrate 30 mg p.o. daily -- Aspirin 81 mg p.o. daily -- Atorvastatin 10 mg p.o. daily  Questionable seizure disorder Tremor, abnormal eye movements Patient reports that she was recently taken off her antiepileptics. -- EEG: With no seizure or epileptiform discharges noted -- Started on Depakote after conversation with neurology by previous hospitalist Dr Sunnie Nielsen -- Outpatient follow-up with neurology  Depression/anxiety -- Sertraline 75 mg p.o. daily  History of CVA with residual right-sided weakness Currently resident at [facility], SNF, apparently bedbound given significant decline since December 2024 and that apparently bedbound/wheelchair dependent. -- Continue aspirin, statin -- Supportive care -- Plan to return to SNF at time of discharge,  DVT prophylaxis:     Code Status: Limited: Do not attempt resuscitation (DNR) -DNR-LIMITED -Do Not Intubate/DNI  Family Communication: No family present bedside this morning  Disposition Plan:  Level of care: Stepdown Status is: Inpatient Remains inpatient appropriate because: IV heparin, Cardizem drip, IV antibiotics    Consultants:  Cardiology  Procedures:  TTE:  EEG:   Antimicrobials:  Ceftriaxone 3/21 - 3/23 Azithromycin 3/22 - 3/26 Meropenem 3/24>>   Subjective: Patient seen examined bedside, lying in bed.  Remains pleasantly confused.  Alert.  No specific complaints this morning other than generalized weakness/body aches.  Heart rate controlled on increased dose of metoprolol and Cardizem drip.  No significant findings on TTE or EEG yesterday. No family present at bedside.  No other specific complaints or concerns at this time.   Denies headache, no abdominal pain, no fever/chills, no nausea/vomiting/diarrhea.  Discussed with RN.  No other acute concerns overnight per nursing staff.  Objective: Vitals:   05/19/23 2200 05/19/23 2228 05/20/23 0000 05/20/23 0400  BP:      Pulse:  97    Resp: (!) 21 20    Temp:   99.1 F (37.3 C) 98.2 F (36.8 C)  TempSrc:   Oral Axillary  SpO2: 93% 94%    Weight:      Height:        Intake/Output Summary (Last 24 hours) at 05/20/2023 1013 Last data filed at 05/19/2023 1701 Gross per 24 hour  Intake 311.05 ml  Output 1080 ml  Net -768.95 ml   Filed Weights   05/15/23 0010 05/15/23 1100  Weight: 74.8 kg 72.4 kg    Examination:  Physical Exam: GEN: NAD, alert confused; oriented to person/place, chronically ill appearance, appears older than stated age HEENT: NCAT, PERRL, EOMI, sclera clear, MMM PULM: Breath sounds slight diminished bilateral bases, no wheezing/crackles, normal respiratory effort without accessory muscle use, on 4 L nasal cannula with SpO2 94% at rest. CV: RRR w/o M/G/R GI: abd soft, NTND, NABS, no R/G/M MSK: Moves all extremities independently NEURO: No focal neurological deficits appreciated  Integumentary: No concerning rashes/lesions/wounds noted on exposed skin surface.    Data Reviewed: I have personally reviewed following labs and imaging studies  CBC: Recent Labs  Lab 05/14/23 2122 05/15/23 0527 05/16/23 0513 05/17/23 0435 05/18/23 0458 05/19/23 0515 05/20/23 0012  WBC 10.9*   < > 12.5* 10.8* 18.1* 12.0* 14.6*  NEUTROABS 8.5*  --   --   --   --   --   --  HGB 11.3*   < > 10.7* 9.5* 10.6* 9.7* 9.4*  HCT 32.1*   < > 32.1* 28.6* 33.4* 29.5* 28.6*  MCV 88.7   < > 90.2 92.3 95.4 92.8 92.3  PLT 117*   < > 137* 142* 179 206 225   < > = values in this interval not displayed.   Basic Metabolic Panel: Recent Labs  Lab 05/17/23 0435 05/18/23 0458 05/18/23 1656 05/19/23 0638 05/20/23 0012  NA 134* 132* 129* 131* 130*  K 4.4 4.2 4.1  3.5 3.6  CL 105 106 98 96* 96*  CO2 17* 14* 18* 23 25  GLUCOSE 101* 184* 143* 117* 181*  BUN 39* 42* 43* 38* 31*  CREATININE 2.03* 1.61* 1.58* 1.37* 1.37*  CALCIUM 8.2* 8.7* 8.3* 8.2* 8.1*  MG  --   --   --   --  2.8*  PHOS  --   --   --   --  2.5   GFR: Estimated Creatinine Clearance: 36.5 mL/min (A) (by C-G formula based on SCr of 1.37 mg/dL (H)). Liver Function Tests: Recent Labs  Lab 05/14/23 2122  AST 21  ALT 12  ALKPHOS 68  BILITOT 0.7  PROT 7.1  ALBUMIN 4.0   No results for input(s): "LIPASE", "AMYLASE" in the last 168 hours. Recent Labs  Lab 05/15/23 1536  AMMONIA 23   Coagulation Profile: Recent Labs  Lab 05/14/23 2135  INR 1.1   Cardiac Enzymes: No results for input(s): "CKTOTAL", "CKMB", "CKMBINDEX", "TROPONINI" in the last 168 hours. BNP (last 3 results) No results for input(s): "PROBNP" in the last 8760 hours. HbA1C: No results for input(s): "HGBA1C" in the last 72 hours. CBG: Recent Labs  Lab 05/19/23 0731 05/19/23 1122 05/19/23 1654 05/19/23 2124 05/20/23 0748  GLUCAP 119* 188* 107* 196* 155*   Lipid Profile: No results for input(s): "CHOL", "HDL", "LDLCALC", "TRIG", "CHOLHDL", "LDLDIRECT" in the last 72 hours. Thyroid Function Tests: Recent Labs    05/20/23 0012  TSH 0.519   Anemia Panel: No results for input(s): "VITAMINB12", "FOLATE", "FERRITIN", "TIBC", "IRON", "RETICCTPCT" in the last 72 hours. Sepsis Labs: Recent Labs  Lab 05/14/23 2128 05/18/23 1656 05/18/23 1822  LATICACIDVEN 1.2 2.9* 1.6    Recent Results (from the past 240 hours)  Blood Culture (routine x 2)     Status: None (Preliminary result)   Collection Time: 05/14/23  9:22 PM   Specimen: BLOOD  Result Value Ref Range Status   Specimen Description   Final    BLOOD LEFT ANTECUBITAL Performed at [facility], [address] [place], [place] [address]    Special Requests   Final    BOTTLES DRAWN AEROBIC AND ANAEROBIC Blood Culture results  may not be optimal due to an inadequate volume of blood received in culture bottles Performed at [facility], [address], [place], [place] [address]    Culture   Final    NO GROWTH 4 DAYS Performed at [facility], [address], [place], [place] [address]    Report Status PENDING  Incomplete  Blood Culture (routine x 2)     Status: None (Preliminary result)   Collection Time: 05/14/23  9:50 PM   Specimen: BLOOD RIGHT ARM  Result Value Ref Range Status   Specimen Description   Final    BLOOD RIGHT ARM Performed at [facility], [address] [place], [place] [address]    Special Requests   Final    BOTTLES  DRAWN AEROBIC AND ANAEROBIC Blood Culture results may not be optimal due to an inadequate volume of blood received in culture bottles Performed at [facility], [address], [place], [place] [address]    Culture   Final    NO GROWTH 4 DAYS Performed at [facility], [address] [place], [place] [address]    Report Status PENDING  Incomplete  Resp panel by RT-PCR (RSV, Flu A&B, Covid) Anterior Nasal Swab     Status: None   Collection Time: 05/14/23 10:00 PM   Specimen: Anterior Nasal Swab  Result Value Ref Range Status   SARS Coronavirus 2 by RT PCR NEGATIVE NEGATIVE Final    Comment: (NOTE) SARS-CoV-2 target nucleic acids are NOT DETECTED.  The SARS-CoV-2 RNA is generally detectable in upper respiratory specimens during the acute phase of infection. The lowest concentration of SARS-CoV-2 viral copies this assay can detect is 138 copies/mL. A negative result does not preclude SARS-Cov-2 infection and should not be used as the sole basis for treatment or other patient management decisions. A negative result may occur with  improper specimen collection/handling, submission of specimen other than nasopharyngeal swab, presence of viral mutation(s) within the areas targeted by this assay, and  inadequate number of viral copies(<138 copies/mL). A negative result must be combined with clinical observations, patient history, and epidemiological information. The expected result is Negative.  Fact Sheet for Patients:  BloggerCourse.com  Fact Sheet for Healthcare Providers:  SeriousBroker.it  This test is no t yet approved or cleared by the [place] FDA and  has been authorized for detection and/or diagnosis of SARS-CoV-2 by FDA under an Emergency Use Authorization (EUA). This EUA will remain  in effect (meaning this test can be used) for the duration of the COVID-19 declaration under Section 564(b)(1) of the Act, 21 U.S.C.section 360bbb-3(b)(1), unless the authorization is terminated  or revoked sooner.       Influenza A by PCR NEGATIVE NEGATIVE Final   Influenza B by PCR NEGATIVE NEGATIVE Final    Comment: (NOTE) The Xpert Xpress SARS-CoV-2/FLU/RSV plus assay is intended as an aid in the diagnosis of influenza from Nasopharyngeal swab specimens and should not be used as a sole basis for treatment. Nasal washings and aspirates are unacceptable for Xpert Xpress SARS-CoV-2/FLU/RSV testing.  Fact Sheet for Patients: BloggerCourse.com  Fact Sheet for Healthcare Providers: SeriousBroker.it  This test is not yet approved or cleared by the [place] FDA and has been authorized for detection and/or diagnosis of SARS-CoV-2 by FDA under an Emergency Use Authorization (EUA). This EUA will remain in effect (meaning this test can be used) for the duration of the COVID-19 declaration under Section 564(b)(1) of the Act, 21 U.S.C. section 360bbb-3(b)(1), unless the authorization is terminated or revoked.     Resp Syncytial Virus by PCR NEGATIVE NEGATIVE Final    Comment: (NOTE) Fact Sheet for Patients: BloggerCourse.com  Fact Sheet for Healthcare  Providers: SeriousBroker.it  This test is not yet approved or cleared by the [place] FDA and has been authorized for detection and/or diagnosis of SARS-CoV-2 by FDA under an Emergency Use Authorization (EUA). This EUA will remain in effect (meaning this test can be used) for the duration of the COVID-19 declaration under Section 564(b)(1) of the Act, 21 U.S.C. section 360bbb-3(b)(1), unless the authorization is terminated or revoked.  Performed at [facility], [address] [place], [place] [address]   Urine Culture     Status: Abnormal  Collection Time: 05/15/23 12:29 AM   Specimen: Urine, Random  Result Value Ref Range Status   Specimen Description   Final    URINE, RANDOM Performed at [facility], [address], [place], [place] [address]    Special Requests   Final    NONE Reflexed from [id number] Performed at [facility], [address] [place], [place] [address]    Culture (A)  Final    >=100,000 COLONIES/mL KLEBSIELLA PNEUMONIAE Confirmed Extended Spectrum Beta-Lactamase Producer (ESBL).  In bloodstream infections from ESBL organisms, carbapenems are preferred over piperacillin/tazobactam. They are shown to have a lower risk of mortality.    Report Status 05/17/2023 FINAL  Final   Organism ID, Bacteria KLEBSIELLA PNEUMONIAE (A)  Final      Susceptibility   Klebsiella pneumoniae - MIC*    AMPICILLIN >=32 RESISTANT Resistant     CEFAZOLIN >=64 RESISTANT Resistant     CEFEPIME >=32 RESISTANT Resistant     CEFTRIAXONE >=64 RESISTANT Resistant     CIPROFLOXACIN <=0.25 SENSITIVE Sensitive     GENTAMICIN <=1 SENSITIVE Sensitive     IMIPENEM <=0.25 SENSITIVE Sensitive     NITROFURANTOIN <=16 SENSITIVE Sensitive     TRIMETH/SULFA <=20 SENSITIVE Sensitive     AMPICILLIN/SULBACTAM 16 INTERMEDIATE Intermediate     PIP/TAZO <=4 SENSITIVE Sensitive ug/mL    * >=100,000 COLONIES/mL  KLEBSIELLA PNEUMONIAE  MRSA Next Gen by PCR, Nasal     Status: None   Collection Time: 05/15/23  5:40 PM   Specimen: Nasal Mucosa; Nasal Swab  Result Value Ref Range Status   MRSA by PCR Next Gen NOT DETECTED NOT DETECTED Final    Comment: (NOTE) The GeneXpert MRSA Assay (FDA approved for NASAL specimens only), is one component of a comprehensive MRSA colonization surveillance program. It is not intended to diagnose MRSA infection nor to guide or monitor treatment for MRSA infections. Test performance is not FDA approved in patients less than 61 years old. Performed at [facility], [address], [place], [place] [address]   MRSA Next Gen by PCR, Nasal     Status: None   Collection Time: 05/18/23 10:59 AM   Specimen: Nasal Mucosa; Nasal Swab  Result Value Ref Range Status   MRSA by PCR Next Gen NOT DETECTED NOT DETECTED Final    Comment: (NOTE) The GeneXpert MRSA Assay (FDA approved for NASAL specimens only), is one component of a comprehensive MRSA colonization surveillance program. It is not intended to diagnose MRSA infection nor to guide or monitor treatment for MRSA infections. Test performance is not FDA approved in patients less than 51 years old. Performed at [facility], [address] [place], [place] [address]   Culture, blood (Routine X 2) w Reflex to ID Panel     Status: None (Preliminary result)   Collection Time: 05/18/23  4:56 PM   Specimen: BLOOD  Result Value Ref Range Status   Specimen Description   Final    BLOOD BLOOD RIGHT HAND Performed at [facility], [address] [place], [place] [address]    Special Requests   Final    BOTTLES DRAWN AEROBIC ONLY Blood Culture results may not be optimal due to an inadequate volume of blood received in culture bottles Performed at [facility], [address] [place], [place] [address]    Culture   Final    NO GROWTH < 12  HOURS Performed at [facility],  [address], [place], [place] [address]    Report Status PENDING  Incomplete  Culture, blood (Routine X 2) w Reflex to ID Panel     Status: None (Preliminary result)   Collection Time: 05/18/23  5:03 PM   Specimen: BLOOD  Result Value Ref Range Status   Specimen Description   Final    BLOOD BLOOD RIGHT HAND Performed at [facility], [address], [place], [place] [address]    Special Requests   Final    BOTTLES DRAWN AEROBIC ONLY Blood Culture results may not be optimal due to an inadequate volume of blood received in culture bottles Performed at [facility], [address] [place], [place] [address]    Culture   Final    NO GROWTH < 12 HOURS Performed at [facility], [address], [place], [place] [address]    Report Status PENDING  Incomplete         Radiology Studies: EEG adult Result Date: 05/19/2023 Charlsie Quest, MD     05/19/2023  8:35 PM Patient Name: JARYIAH MEHLMAN MRN: [id number] Epilepsy Attending: Charlsie Quest Referring Physician/Provider: Alba Cory, MD Date: 05/19/2023 Duration: 24.22 mins Patient history: 72yo F with ams. EEG to evaluate for seizure Level of alertness: Awake AEDs during EEG study: GBP, VPA Technical aspects: This EEG study was done with scalp electrodes positioned according to the 10-20 International system of electrode placement. Electrical activity was reviewed with band pass filter of 1-70Hz , sensitivity of 7 uV/mm, display speed of 71mm/sec with a 60Hz  notched filter applied as appropriate. EEG data were recorded continuously and digitally stored.  Video monitoring was available and reviewed as appropriate. Description: The posterior dominant rhythm consists of 7.5 Hz activity of moderate voltage (25-35 uV) seen predominantly in posterior head regions, symmetric and reactive to eye opening and eye closing. EEG showed intermittent generalized 3  to 6 Hz theta-delta slowing. Hyperventilation and photic stimulation were not performed.    ABNORMALITY - Intermittent slow, generalized  IMPRESSION: This study is suggestive of mild diffuse encephalopathy. No seizures or epileptiform discharges were seen throughout the recording.  Charlsie Quest   ECHOCARDIOGRAM COMPLETE Result Date: 05/19/2023    ECHOCARDIOGRAM REPORT   Patient Name:   CLASSIE WENG Date of Exam: 05/19/2023 Medical Rec #:  [id number]         Height:       65.0 in Accession #:    [id number]        Weight:       159.6 lb Date of Birth:  02/07/1951         BSA:          1.797 m Patient Age:    72 years          BP:           113/81 mmHg Patient Gender: F                 HR:           88 bpm. Exam Location:  Inpatient Procedure: 2D Echo, Cardiac Doppler, Color Doppler and Intracardiac            Opacification Agent (Both Spectral and Color Flow Doppler were            utilized during procedure). Indications:    Chest Pain  History:        Patient has prior history of Echocardiogram examinations. CAD,  Stroke; Risk Factors:Diabetes and Hypertension.  Sonographer:    Lamont Snowball Referring Phys: [id number] Ovetta Bazzano J [place]  Sonographer Comments: Patient is obese. Image acquisition challenging due to respiratory motion. IMPRESSIONS  1. Left ventricular ejection fraction, by estimation, is 60 to 65%. The left ventricle has normal function. The left ventricle has no regional wall motion abnormalities. There is mild left ventricular hypertrophy. Left ventricular diastolic parameters are indeterminate.  2. Right ventricular systolic function is normal. The right ventricular size is normal.  3. Left atrial size was moderately dilated.  4. The mitral valve is normal in structure. No evidence of mitral valve regurgitation. No evidence of mitral stenosis.  5. The aortic valve is normal in structure. Aortic valve regurgitation is not visualized. No aortic stenosis is present.  6. Aortic  dilatation noted. There is mild dilatation of the ascending aorta, measuring 41 mm.  7. The inferior vena cava is dilated in size with <50% respiratory variability, suggesting right atrial pressure of 15 mmHg. FINDINGS  Left Ventricle: Left ventricular ejection fraction, by estimation, is 60 to 65%. The left ventricle has normal function. The left ventricle has no regional wall motion abnormalities. The left ventricular internal cavity size was normal in size. There is  mild left ventricular hypertrophy. Left ventricular diastolic parameters are indeterminate. Right Ventricle: The right ventricular size is normal. No increase in right ventricular wall thickness. Right ventricular systolic function is normal. Left Atrium: Left atrial size was moderately dilated. Right Atrium: Right atrial size was normal in size. Pericardium: There is no evidence of pericardial effusion. Mitral Valve: The mitral valve is normal in structure. No evidence of mitral valve regurgitation. No evidence of mitral valve stenosis. MV peak gradient, 6.9 mmHg. The mean mitral valve gradient is 3.0 mmHg. Tricuspid Valve: The tricuspid valve is normal in structure. Tricuspid valve regurgitation is not demonstrated. No evidence of tricuspid stenosis. Aortic Valve: The aortic valve is normal in structure. Aortic valve regurgitation is not visualized. No aortic stenosis is present. Aortic valve peak gradient measures 6.5 mmHg. Pulmonic Valve: The pulmonic valve was normal in structure. Pulmonic valve regurgitation is trivial. No evidence of pulmonic stenosis. Aorta: Aortic dilatation noted. There is mild dilatation of the ascending aorta, measuring 41 mm. Venous: The inferior vena cava is dilated in size with less than 50% respiratory variability, suggesting right atrial pressure of 15 mmHg. IAS/Shunts: No atrial level shunt detected by color flow Doppler.  LEFT VENTRICLE PLAX 2D LVIDd:         4.60 cm LVIDs:         3.00 cm LV PW:         1.10 cm LV  IVS:        1.20 cm LVOT diam:     2.00 cm LV SV:         34 LV SV Index:   19 LVOT Area:     3.14 cm  RIGHT VENTRICLE          IVC RV Basal diam:  3.00 cm  IVC diam: 2.50 cm LEFT ATRIUM             Index        RIGHT ATRIUM           Index LA diam:        4.10 cm 2.28 cm/m   RA Area:     13.10 cm LA Vol (A2C):   85.7 ml 47.68 ml/m  RA Volume:   28.30 ml  15.75  ml/m LA Vol (A4C):   72.6 ml 40.39 ml/m LA Biplane Vol: 80.0 ml 44.51 ml/m  AORTIC VALVE AV Area (Vmax): 1.72 cm AV Vmax:        127.67 cm/s AV Peak Grad:   6.5 mmHg LVOT Vmax:      69.97 cm/s LVOT Vmean:     48.133 cm/s LVOT VTI:       0.109 m  AORTA Ao Root diam: 2.90 cm Ao Asc diam:  4.10 cm MITRAL VALVE MV Area (PHT): 4.89 cm     SHUNTS MV Area VTI:   1.58 cm     Systemic VTI:  0.11 m MV Peak grad:  6.9 mmHg     Systemic Diam: 2.00 cm MV Mean grad:  3.0 mmHg MV Vmax:       1.32 m/s MV Vmean:      80.6 cm/s MV E velocity: 113.00 cm/s Donato Schultz MD Electronically signed by Donato Schultz MD Signature Date/Time: 05/19/2023/4:11:30 PM    Final    DG CHEST PORT 1 VIEW Result Date: 05/18/2023 CLINICAL DATA:  Hypoxia. EXAM: PORTABLE CHEST 1 VIEW COMPARISON:  Chest radiograph dated 05/14/2023. FINDINGS: Small left pleural effusion and left lung base atelectasis. Pneumonia is not excluded. The right lung is clear. No pneumothorax. Stable cardiomegaly. No acute osseous pathology. IMPRESSION: Small left pleural effusion and left lung base atelectasis. Electronically Signed   By: Elgie Collard M.D.   On: 05/18/2023 11:46        Scheduled Meds:  aspirin EC  81 mg Oral Daily   atorvastatin  10 mg Oral QHS   Chlorhexidine Gluconate Cloth  6 each Topical Daily   gabapentin  200 mg Oral BID   hydroxychloroquine  200 mg Oral Daily   insulin aspart  0-5 Units Subcutaneous QHS   insulin aspart  0-9 Units Subcutaneous TID WC   insulin glargine  15 Units Subcutaneous QHS   isosorbide mononitrate  30 mg Oral Daily   linaclotide  145 mcg Oral Daily    metoprolol tartrate  25 mg Oral BID   pantoprazole  40 mg Oral Daily   sertraline  75 mg Oral Daily   valproic acid  500 mg Oral TID   Continuous Infusions:  diltiazem (CARDIZEM) infusion 5 mg/hr (05/20/23 0524)   heparin 1,150 Units/hr (05/19/23 1703)   meropenem (MERREM) IV Stopped (05/20/23 0004)     LOS: 5 days    Time spent: 52 minutes spent on 05/20/2023 caring for this patient face-to-face including chart review, ordering labs/tests, documenting, discussion with nursing staff, consultants, updating family and interview/physical exam    Alvira Philips [place], DO [facility] Available via Epic secure chat 7am-7pm After these hours, please refer to coverage provider listed on [email or web address] 05/20/2023, 10:13 AM

## 2023-05-20 NOTE — Progress Notes (Signed)
 PHARMACY - ANTICOAGULATION CONSULT NOTE  Pharmacy Consult for heparin Indication: chest pain/ACS  Allergies  Allergen Reactions   Ciprofloxacin     Per MAR   Gemifloxacin     Per MAR   Infliximab Other (See Comments)    Unknown reaction  unknown   Levaquin [Levofloxacin]     Per MAR   Meperidine Other (See Comments)    Unknown reaction  unknown   Mercury     hives   Moxifloxacin     Per MAR   Ofloxacin     Per MAR   Penicillins     rash   Phenobarbital Other (See Comments)    Unknown reaction  unknown   Pregabalin Other (See Comments)    Dry mouth, confusion, incontinence   Prochlorperazine Other (See Comments)    Unknown reaction  unknown    Patient Measurements: Height: 5\' 5"  (165.1 cm) Weight: 72.4 kg (159 lb 9.8 oz) IBW/kg (Calculated) : 57 Heparin Dosing Weight: 71 kg  Vital Signs: Temp: 99.1 F (37.3 C) (03/27 0000) Temp Source: Oral (03/27 0000) BP: 151/68 (03/26 2100) Pulse Rate: 97 (03/26 2228)  Labs: Recent Labs    05/18/23 0458 05/18/23 0458 05/18/23 1656 05/18/23 1815 05/19/23 0515 05/19/23 1610 05/19/23 0824 05/19/23 1409 05/20/23 0012  HGB 10.6*  --   --   --  9.7*  --   --   --  9.4*  HCT 33.4*  --   --   --  29.5*  --   --   --  28.6*  PLT 179  --   --   --  206  --   --   --  225  APTT  --   --   --   --  57*  --   --  61* 78*  HEPARINUNFRC  --   --   --   --  >1.10*  --   --   --  0.67  CREATININE 1.61*  --  1.58*  --   --  1.37*  --   --  1.37*  TROPONINIHS  --    < > 1,050* 920*  --  625* 514*  --   --    < > = values in this interval not displayed.    Estimated Creatinine Clearance: 36.5 mL/min (A) (by C-G formula based on SCr of 1.37 mg/dL (H)).   Medical History: Past Medical History:  Diagnosis Date   CAD S/P percutaneous coronary angioplasty 05/15/2023   Chronic ITP (idiopathic thrombocytopenia) (HCC) 05/15/2023   Diabetes mellitus without complication (HCC)    Essential hypertension 05/15/2023   Ganglion  cyst    Hypertension    Psoriatic arthritis (HCC) 05/15/2023   Seizures (HCC)    Stroke [facility])      Assessment: 73 year old female presented with dysuria, productive cough, shortness of breath and chest pain. On 3/25 am, patient was noted to be more lethargic and tachypneic; she was transferred to the stepdown unit. She was found to have new onset afib and started on Eliquis. She received one dose. Later in evening, concern for ACS with elevated troponin and chest pain. Pharmacy consult to manage heparin infusion.  05/20/23 -aPTT 78 (therapeutic) with heparin infusion at 1150 units/hr -HL = 0.67 (therapeutic)  -Hgb = 9.4, Pltc 225k -No complications of therapy noted  Goal of Therapy:  Heparin level 0.3-0.7 units/ml aPTT 66-102 seconds Monitor platelets by anticoagulation protocol: Yes   Plan:  -Continue heparin infusion @  1150 units/hr - As both aPTT and HL now correlate (both in therapeutic range), will monitor heparin therapy with only heparin levels now -Check HL in 8 hours to confirm therapeutic dose -Daily CBC & heparin level   Raiford Fetterman, Joselyn Glassman, PharmD Clinical Pharmacist 05/20/2023 1:07 AM

## 2023-05-20 NOTE — Progress Notes (Signed)
 PHARMACY - ANTICOAGULATION CONSULT NOTE  Pharmacy Consult for heparin Indication: chest pain/ACS  Allergies  Allergen Reactions   Ciprofloxacin     Per MAR   Gemifloxacin     Per MAR   Infliximab Other (See Comments)    Unknown reaction  unknown   Levaquin [Levofloxacin]     Per MAR   Meperidine Other (See Comments)    Unknown reaction  unknown   Mercury     hives   Moxifloxacin     Per MAR   Ofloxacin     Per MAR   Penicillins     rash   Phenobarbital Other (See Comments)    Unknown reaction  unknown   Pregabalin Other (See Comments)    Dry mouth, confusion, incontinence   Prochlorperazine Other (See Comments)    Unknown reaction  unknown    Patient Measurements: Height: 5\' 5"  (165.1 cm) Weight: 72.4 kg (159 lb 9.8 oz) IBW/kg (Calculated) : 57 Heparin Dosing Weight: 71 kg  Vital Signs: Temp: 98.2 F (36.8 C) (03/27 0400) Temp Source: Axillary (03/27 0400) Pulse Rate: 97 (03/26 2228)  Labs: Recent Labs    05/18/23 0458 05/18/23 0458 05/18/23 1656 05/18/23 1815 05/19/23 0515 05/19/23 5409 05/19/23 0824 05/19/23 1409 05/20/23 0012 05/20/23 0819  HGB 10.6*  --   --   --  9.7*  --   --   --  9.4*  --   HCT 33.4*  --   --   --  29.5*  --   --   --  28.6*  --   PLT 179  --   --   --  206  --   --   --  225  --   APTT  --   --   --   --  57*  --   --  61* 78*  --   HEPARINUNFRC  --   --   --   --  >1.10*  --   --   --  0.67 0.76*  CREATININE 1.61*  --  1.58*  --   --  1.37*  --   --  1.37*  --   TROPONINIHS  --    < > 1,050* 920*  --  625* 514*  --   --   --    < > = values in this interval not displayed.    Estimated Creatinine Clearance: 36.5 mL/min (A) (by C-G formula based on SCr of 1.37 mg/dL (H)).  Assessment: 73 year old female presented with dysuria, productive cough, shortness of breath and chest pain. On 3/25 am, patient was noted to be more lethargic and tachypneic; she was transferred to the stepdown unit. She was found to have new  onset afib and started on Eliquis. She received one dose. Later in evening, concern for ACS with elevated troponin and chest pain. Pharmacy consult to manage heparin infusion.  05/20/23 HL now slightly SUPRAtherapeutic on 1150 units/hr (previously borderline high on same rate) Hgb low but stable; Plt stable WNL SCr downtrending but remains above admission level No bleeding or infusion issues per RN  Goal of Therapy:  Heparin level 0.3-0.7 units/ml Monitor platelets by anticoagulation protocol: Yes   Plan:  Decrease heparin infusion to 950 units/hr Check HL 8 hours after rate change Daily CBC & heparin level   Tripp Goins A, PharmD Clinical Pharmacist 05/20/2023 9:49 AM

## 2023-05-21 ENCOUNTER — Inpatient Hospital Stay (HOSPITAL_COMMUNITY)

## 2023-05-21 DIAGNOSIS — J189 Pneumonia, unspecified organism: Secondary | ICD-10-CM | POA: Diagnosis not present

## 2023-05-21 LAB — CBC
HCT: 31.5 % — ABNORMAL LOW (ref 36.0–46.0)
Hemoglobin: 9.8 g/dL — ABNORMAL LOW (ref 12.0–15.0)
MCH: 30.2 pg (ref 26.0–34.0)
MCHC: 31.1 g/dL (ref 30.0–36.0)
MCV: 96.9 fL (ref 80.0–100.0)
Platelets: 191 10*3/uL (ref 150–400)
RBC: 3.25 MIL/uL — ABNORMAL LOW (ref 3.87–5.11)
RDW: 13.7 % (ref 11.5–15.5)
WBC: 10.2 10*3/uL (ref 4.0–10.5)
nRBC: 0 % (ref 0.0–0.2)

## 2023-05-21 LAB — BASIC METABOLIC PANEL WITH GFR
Anion gap: 9 (ref 5–15)
BUN: 24 mg/dL — ABNORMAL HIGH (ref 8–23)
CO2: 25 mmol/L (ref 22–32)
Calcium: 8.1 mg/dL — ABNORMAL LOW (ref 8.9–10.3)
Chloride: 101 mmol/L (ref 98–111)
Creatinine, Ser: 1.07 mg/dL — ABNORMAL HIGH (ref 0.44–1.00)
GFR, Estimated: 55 mL/min — ABNORMAL LOW (ref >60)
Glucose, Bld: 169 mg/dL — ABNORMAL HIGH (ref 70–99)
Potassium: 3.4 mmol/L — ABNORMAL LOW (ref 3.5–5.1)
Sodium: 135 mmol/L (ref 135–145)

## 2023-05-21 LAB — GLUCOSE, CAPILLARY
Glucose-Capillary: 130 mg/dL — ABNORMAL HIGH (ref 70–99)
Glucose-Capillary: 126 mg/dL — ABNORMAL HIGH (ref 70–99)
Glucose-Capillary: 220 mg/dL — ABNORMAL HIGH (ref 70–99)
Glucose-Capillary: 194 mg/dL — ABNORMAL HIGH (ref 70–99)

## 2023-05-21 LAB — BRAIN NATRIURETIC PEPTIDE: B Natriuretic Peptide: 710.7 pg/mL — ABNORMAL HIGH (ref 0.0–100.0)

## 2023-05-21 LAB — HEPARIN LEVEL (UNFRACTIONATED): Heparin Unfractionated: 0.32 [IU]/mL (ref 0.30–0.70)

## 2023-05-21 MED ORDER — FUROSEMIDE 10 MG/ML IJ SOLN
40.0000 mg | Freq: Once | INTRAMUSCULAR | Status: AC
  Administered 2023-05-21: 40 mg via INTRAVENOUS
  Filled 2023-05-21: qty 4

## 2023-05-21 MED ORDER — POTASSIUM CHLORIDE CRYS ER 20 MEQ PO TBCR
30.0000 meq | EXTENDED_RELEASE_TABLET | ORAL | Status: AC
  Administered 2023-05-21 (×2): 30 meq via ORAL
  Filled 2023-05-21 (×2): qty 1

## 2023-05-21 NOTE — Progress Notes (Signed)
 Rounding Note    Patient Name: Olivia Burnett Date of Encounter: 05/21/2023  [facility] Cardiologist: [facility]  Subjective   PT just moved to floor    SOme SOB   No CP   Inpatient Medications    Scheduled Meds:  aspirin EC  81 mg Oral Daily   atorvastatin  10 mg Oral QHS   Chlorhexidine Gluconate Cloth  6 each Topical Daily   furosemide  40 mg Intravenous Once   gabapentin  200 mg Oral BID   hydroxychloroquine  200 mg Oral Daily   insulin aspart  0-5 Units Subcutaneous QHS   insulin aspart  0-9 Units Subcutaneous TID WC   insulin glargine  15 Units Subcutaneous QHS   isosorbide mononitrate  30 mg Oral Daily   linaclotide  145 mcg Oral Daily   metoprolol tartrate  25 mg Oral QID   pantoprazole  40 mg Oral Daily   potassium chloride  30 mEq Oral Q3H   sertraline  75 mg Oral Daily   valproic acid  500 mg Oral TID   Continuous Infusions:  diltiazem (CARDIZEM) infusion Stopped (05/20/23 1606)   heparin 1,050 Units/hr (05/20/23 2123)   meropenem (MERREM) IV Stopped (05/20/23 2237)   PRN Meds: acetaminophen **OR** acetaminophen, bisacodyl, guaiFENesin, melatonin, nitroGLYCERIN, ondansetron (ZOFRAN) IV, mouth rinse, polyethylene glycol   Vital Signs    Vitals:   05/21/23 0400 05/21/23 0500 05/21/23 0600 05/21/23 0700  BP: (!) 147/71 (!) 153/81 (!) 140/81 (!) 149/74  Pulse:      Resp: 17 16 18 17   Temp: 98.2 F (36.8 C)     TempSrc: Axillary     SpO2:      Weight:      Height:        Intake/Output Summary (Last 24 hours) at 05/21/2023 0818 Last data filed at 05/20/2023 2207 Gross per 24 hour  Intake 706.37 ml  Output 1350 ml  Net -643.63 ml      05/15/2023   11:00 AM 05/15/2023   12:10 AM 02/10/2023    2:45 PM  Last 3 Weights  Weight (lbs) 159 lb 9.8 oz 164 lb 14.5 oz 164 lb 14.5 oz  Weight (kg) 72.4 kg 74.8 kg 74.8 kg      Telemetry    Atrial fibrillation  80s to 100s  - Personally Reviewed  ECG     - Personally Reviewed  Physical  Exam   GEN: No acute distress.  Just transferred to floor bed  Neck: Neck is full  Cardiac:  Irreg irreg  No S3   Respiratory: Clear to auscultation  GI: Soft, nontender, non-distended  MS: No edema;  Labs    High Sensitivity Troponin:   Recent Labs  Lab 05/14/23 2122 05/18/23 1656 05/18/23 1815 05/19/23 0638 05/19/23 0824  TROPONINIHS 9 1,050* 920* 625* 514*     Chemistry Recent Labs  Lab 05/14/23 2122 05/15/23 0527 05/19/23 0638 05/20/23 0012 05/21/23 0407  NA 130*   < > 131* 130* 135  K 3.9   < > 3.5 3.6 3.4*  CL 103   < > 96* 96* 101  CO2 19*   < > 23 25 25   GLUCOSE 353*   < > 117* 181* 169*  BUN 24*   < > 38* 31* 24*  CREATININE 1.16*   < > 1.37* 1.37* 1.07*  CALCIUM 8.6*   < > 8.2* 8.1* 8.1*  MG  --   --   --  2.8*  --  PROT 7.1  --   --   --   --   ALBUMIN 4.0  --   --   --   --   AST 21  --   --   --   --   ALT 12  --   --   --   --   ALKPHOS 68  --   --   --   --   BILITOT 0.7  --   --   --   --   GFRNONAA 50*   < > 41* 41* 55*  ANIONGAP 8   < > 12 9 9    < > = values in this interval not displayed.    Lipids No results for input(s): "CHOL", "TRIG", "HDL", "LABVLDL", "LDLCALC", "CHOLHDL" in the last 168 hours.  Hematology Recent Labs  Lab 05/19/23 0515 05/20/23 0012 05/21/23 0407  WBC 12.0* 14.6* 10.2  RBC 3.18* 3.10* 3.25*  HGB 9.7* 9.4* 9.8*  HCT 29.5* 28.6* 31.5*  MCV 92.8 92.3 96.9  MCH 30.5 30.3 30.2  MCHC 32.9 32.9 31.1  RDW 13.9 13.8 13.7  PLT 206 225 191   Thyroid  Recent Labs  Lab 05/20/23 0012  TSH 0.519    BNP Recent Labs  Lab 05/18/23 1815 05/21/23 0407  BNP 919.6* 710.7*    DDimer No results for input(s): "DDIMER" in the last 168 hours.   Radiology    EEG adult Result Date: 05/19/2023 Charlsie Quest, MD     05/19/2023  8:35 PM Patient Name: Olivia Burnett MRN: [id number] Epilepsy Attending: Charlsie Quest Referring Physician/Provider: Alba Cory, MD Date: 05/19/2023 Duration: 24.22 mins Patient  history: 72yo F with ams. EEG to evaluate for seizure Level of alertness: Awake AEDs during EEG study: GBP, VPA Technical aspects: This EEG study was done with scalp electrodes positioned according to the 10-20 International system of electrode placement. Electrical activity was reviewed with band pass filter of 1-70Hz , sensitivity of 7 uV/mm, display speed of 53mm/sec with a 60Hz  notched filter applied as appropriate. EEG data were recorded continuously and digitally stored.  Video monitoring was available and reviewed as appropriate. Description: The posterior dominant rhythm consists of 7.5 Hz activity of moderate voltage (25-35 uV) seen predominantly in posterior head regions, symmetric and reactive to eye opening and eye closing. EEG showed intermittent generalized 3 to 6 Hz theta-delta slowing. Hyperventilation and photic stimulation were not performed.    ABNORMALITY - Intermittent slow, generalized  IMPRESSION: This study is suggestive of mild diffuse encephalopathy. No seizures or epileptiform discharges were seen throughout the recording.  Charlsie Quest   ECHOCARDIOGRAM COMPLETE Result Date: 05/19/2023    ECHOCARDIOGRAM REPORT   Patient Name:   Olivia Burnett Date of Exam: 05/19/2023 Medical Rec #:  [id number]         Height:       65.0 in Accession #:    [id number]        Weight:       159.6 lb Date of Birth:  [birth year]         BSA:          1.797 m Patient Age:    72 years          BP:           113/81 mmHg Patient Gender: F                 HR:  88 bpm. Exam Location:  Inpatient Procedure: 2D Echo, Cardiac Doppler, Color Doppler and Intracardiac            Opacification Agent (Both Spectral and Color Flow Doppler were            utilized during procedure). Indications:    Chest Pain  History:        Patient has prior history of Echocardiogram examinations. CAD,                 Stroke; Risk Factors:Diabetes and Hypertension.  Sonographer:    Lamont Snowball Referring Phys: [id number] ERIC J  [place]  Sonographer Comments: Patient is obese. Image acquisition challenging due to respiratory motion. IMPRESSIONS  1. Left ventricular ejection fraction, by estimation, is 60 to 65%. The left ventricle has normal function. The left ventricle has no regional Burnett motion abnormalities. There is mild left ventricular hypertrophy. Left ventricular diastolic parameters are indeterminate.  2. Right ventricular systolic function is normal. The right ventricular size is normal.  3. Left atrial size was moderately dilated.  4. The mitral valve is normal in structure. No evidence of mitral valve regurgitation. No evidence of mitral stenosis.  5. The aortic valve is normal in structure. Aortic valve regurgitation is not visualized. No aortic stenosis is present.  6. Aortic dilatation noted. There is mild dilatation of the ascending aorta, measuring 41 mm.  7. The inferior vena cava is dilated in size with <50% respiratory variability, suggesting right atrial pressure of 15 mmHg. FINDINGS  Left Ventricle: Left ventricular ejection fraction, by estimation, is 60 to 65%. The left ventricle has normal function. The left ventricle has no regional Burnett motion abnormalities. The left ventricular internal cavity size was normal in size. There is  mild left ventricular hypertrophy. Left ventricular diastolic parameters are indeterminate. Right Ventricle: The right ventricular size is normal. No increase in right ventricular Burnett thickness. Right ventricular systolic function is normal. Left [facility]: Left atrial size was moderately dilated. Right [facility]: Right atrial size was normal in size. Pericardium: There is no evidence of pericardial effusion. Mitral Valve: The mitral valve is normal in structure. No evidence of mitral valve regurgitation. No evidence of mitral valve stenosis. MV peak gradient, 6.9 mmHg. The mean mitral valve gradient is 3.0 mmHg. Tricuspid Valve: The tricuspid valve is normal in structure. Tricuspid valve  regurgitation is not demonstrated. No evidence of tricuspid stenosis. Aortic Valve: The aortic valve is normal in structure. Aortic valve regurgitation is not visualized. No aortic stenosis is present. Aortic valve peak gradient measures 6.5 mmHg. Pulmonic Valve: The pulmonic valve was normal in structure. Pulmonic valve regurgitation is trivial. No evidence of pulmonic stenosis. Aorta: Aortic dilatation noted. There is mild dilatation of the ascending aorta, measuring 41 mm. Venous: The inferior vena cava is dilated in size with less than 50% respiratory variability, suggesting right atrial pressure of 15 mmHg. IAS/Shunts: No atrial level shunt detected by color flow Doppler.  LEFT VENTRICLE PLAX 2D LVIDd:         4.60 cm LVIDs:         3.00 cm LV PW:         1.10 cm LV IVS:        1.20 cm LVOT diam:     2.00 cm LV SV:         34 LV SV Index:   19 LVOT Area:     3.14 cm  RIGHT VENTRICLE  IVC RV Basal diam:  3.00 cm  IVC diam: 2.50 cm LEFT ATRIUM             Index        RIGHT ATRIUM           Index LA diam:        4.10 cm 2.28 cm/m   RA Area:     13.10 cm LA Vol (A2C):   85.7 ml 47.68 ml/m  RA Volume:   28.30 ml  15.75 ml/m LA Vol (A4C):   72.6 ml 40.39 ml/m LA Biplane Vol: 80.0 ml 44.51 ml/m  AORTIC VALVE AV Area (Vmax): 1.72 cm AV Vmax:        127.67 cm/s AV Peak Grad:   6.5 mmHg LVOT Vmax:      69.97 cm/s LVOT Vmean:     48.133 cm/s LVOT VTI:       0.109 m  AORTA Ao Root diam: 2.90 cm Ao Asc diam:  4.10 cm MITRAL VALVE MV Area (PHT): 4.89 cm     SHUNTS MV Area VTI:   1.58 cm     Systemic VTI:  0.11 m MV Peak grad:  6.9 mmHg     Systemic Diam: 2.00 cm MV Mean grad:  3.0 mmHg MV Vmax:       1.32 m/s MV Vmean:      80.6 cm/s MV E velocity: 113.00 cm/s Donato Schultz MD Electronically signed by Donato Schultz MD Signature Date/Time: 05/19/2023/4:11:30 PM    Final     Cardiac Studies   Echo   March 26/2025   1. Left ventricular ejection fraction, by estimation, is 60 to 65%. The  left ventricle has  normal function. The left ventricle has no regional  Burnett motion abnormalities. There is mild left ventricular hypertrophy.  Left ventricular diastolic parameters  are indeterminate.   2. Right ventricular systolic function is normal. The right ventricular  size is normal.   3. Left atrial size was moderately dilated.   4. The mitral valve is normal in structure. No evidence of mitral valve  regurgitation. No evidence of mitral stenosis.   5. The aortic valve is normal in structure. Aortic valve regurgitation is  not visualized. No aortic stenosis is present.   6. Aortic dilatation noted. There is mild dilatation of the ascending  aorta, measuring 41 mm.   7. The inferior vena cava is dilated in size with <50% respiratory  variability, suggesting right atrial pressure of 15 mmHg.    Cardiac catheterization 03/14/2017 Diagnostic Summary  Chronic Total Occlusion of the RCA with good collateral filling. Severe  stenosis of the Ramus Branch, Marginal Branch  Otherwise Diffuse non-obstructive coronary artery disease.  Segmental LV dysfunction of posterior Burnett, inferior Burnett -severe hypokinesis  LV ejection fraction is 35-40 %  Interventional Summary  Successful PCI - Resolute Onyx Drug Eluting Stent of the ostial 1st Obtuse  Marginal Coronary Artery. Successful PCI - PTCA of the proximal Ramus Coronary  Artery.  Interventional Recommendations  Medical therapy for MI, CAD, LV dysfunction  Dual anti-platelet therapy with Ticagrelor and Aspirin 81 mg for at least 12  Patient Profile      Olivia Burnett is a 73 y.o. 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 [place]  Assessment & Plan    1  NSTEMI  Known CAD   Pt presented with multifocal  pneumonia, sepsis   Had pleuritc CP    Developed hypoxia and afib with RVR   Trop peak was 1050    Echo   LVEF normal with no Burnett motion abnormaliies  Trop elev most likely due to demand ischemia in setting of sepsis, rapid HR and known signfiicant CAD      I do not think the pt is having any active ischemia   I would recomm continued medical Rx and would not plan any testing   2  Atrial fibrillation     Rates controlled on metoprolol  Would continue Eliquis as it did not resolve     3  HL  On lipitor  4  Hx CVA   Pt on ASA and plavix at home   Would switch to just plavix since on Eliquis when no procedures planned  Will sign off   Please call with questions    [facility] follows [facility]       For questions or updates, please contact [facility] Please consult [email or web address] for contact info under        Signed, Dietrich Pates, MD  05/21/2023, 8:18 AM

## 2023-05-21 NOTE — Progress Notes (Signed)
 [facility] Liaison Note   Received request from [place], Transitions of Care Manager, for hospice services at facility after discharge. Spoke with patient,  son Trey Paula and daughter Amy to initiate education related to hospice philosophy, services, and team approach to care. Patient/family verbalized understanding of information given. Per discussion, the plan is for discharge home by PTAR/EMS when medically stable.  DME needs discussed. Patient has the following equipment in the home:  manual wheelchair  Patient/family requests the following equipment for delivery:   Hospital bed and possibly Oxygen  The address has been verified and is correct in the chart. Emeterio Reeve, Daughter [phone] will be point of contact.    Please send signed and completed DNR home with patient/family. Please provide prescriptions at discharge as needed to ensure ongoing symptom management.   [facility] information and contact numbers given to Amy and Trey Paula at bedside.  Above information shared with Erick Alley, Transitions of Care Manager.   Please call with any questions or concerns.   Thank you for the opportunity to participate in this patient's care.    Roe Rutherford, BSN, RN Hospice Nurse Liaison [phone]

## 2023-05-21 NOTE — Progress Notes (Signed)
 PHARMACY - ANTICOAGULATION CONSULT NOTE  Pharmacy Consult for heparin Indication: chest pain/ACS  Allergies  Allergen Reactions   Ciprofloxacin     Per MAR   Gemifloxacin     Per MAR   Infliximab Other (See Comments)    Unknown reaction  unknown   Levaquin [Levofloxacin]     Per MAR   Meperidine Other (See Comments)    Unknown reaction  unknown   Mercury     hives   Moxifloxacin     Per MAR   Ofloxacin     Per MAR   Penicillins     rash   Phenobarbital Other (See Comments)    Unknown reaction  unknown   Pregabalin Other (See Comments)    Dry mouth, confusion, incontinence   Prochlorperazine Other (See Comments)    Unknown reaction  unknown    Patient Measurements: Height: 5\' 5"  (165.1 cm) Weight: 72.4 kg (159 lb 9.8 oz) IBW/kg (Calculated) : 57 Heparin Dosing Weight: 71 kg  Vital Signs: Temp: 98.2 F (36.8 C) (03/28 0400) Temp Source: Axillary (03/28 0400) BP: 140/89 (03/28 0300) Pulse Rate: 109 (03/28 0000)  Labs: Recent Labs    05/18/23 0458 05/18/23 1815 05/19/23 0515 05/19/23 8295 05/19/23 0824 05/19/23 1409 05/20/23 0012 05/20/23 0819 05/20/23 1835 05/21/23 0407  HGB  --   --  9.7*  --   --   --  9.4*  --   --  9.8*  HCT  --   --  29.5*  --   --   --  28.6*  --   --  31.5*  PLT  --   --  206  --   --   --  225  --   --  191  APTT  --   --  57*  --   --  61* 78*  --   --   --   HEPARINUNFRC   < >  --  >1.10*  --   --   --  0.67 0.76* 0.28* 0.32  CREATININE  --   --   --  1.37*  --   --  1.37*  --   --  1.07*  TROPONINIHS  --  920*  --  625* 514*  --   --   --   --   --    < > = values in this interval not displayed.    Estimated Creatinine Clearance: 46.7 mL/min (A) (by C-G formula based on SCr of 1.07 mg/dL (H)).  Assessment: 73 year old female presented with dysuria, productive cough, shortness of breath and chest pain. On 3/25 am, patient was noted to be more lethargic and tachypneic; she was transferred to the stepdown unit. She  was found to have new onset afib and started on Eliquis. She received one dose. Later in evening, concern for ACS with elevated troponin and chest pain. Pharmacy consult to manage heparin infusion.  05/21/23 HL 0.32- now therapeutic on IV heparin 1050 units/hr  Hgb low but stable; Plt stable WNL SCr improved to baseline No bleeding or infusion issues per RN  Goal of Therapy:  Heparin level 0.3-0.7 units/ml Monitor platelets by anticoagulation protocol: Yes   Plan:  Continue heparin infusion at 1050 units/hr Daily CBC & heparin level F/U long-term anticoagulation plans  Junita Push, PharmD, BCPS 05/21/2023 4:44 AM

## 2023-05-21 NOTE — TOC Progression Note (Signed)
 Transition of Care [facility]) - Progression Note   Patient Details  Name: Olivia Burnett MRN: [id number] Date of Birth: 1950/10/01  Transition of Care [facility]) CM/SW Contact  Ewing Schlein, LCSW Phone Number: 05/21/2023, 10:16 AM  Clinical Narrative: CSW notified by hospitalist that patient's son, Trey Paula, would like the patient to return to [facility] with hospice services at discharge. CSW confirmed with Soy in admissions the facility uses [facility].  CSW met with patient and son to discuss hospice and son is agreeable to CSW making hospice referral to [facility]. CSW made hospice referral to Melissa/Nicole with [facility]. [facility] to follow up with family. CSW also left voicemail for patient's daughter, Emeterio Reeve requesting call back. FL2 started.  Expected Discharge Plan: Long Term Nursing Home ([facility] hospice services) Barriers to Discharge: Continued Medical Work up  Expected Discharge Plan and Services In-house Referral: Clinical Social Work Post Acute Care Choice: Hospice, Nursing Home Living arrangements for the past 2 months: Skilled Nursing Facility           DME Arranged: N/A DME Agency: NA  Social Determinants of Health (SDOH) Interventions SDOH Screenings   Food Insecurity: No Food Insecurity (02/09/2023)  Housing: Low Risk  (02/09/2023)  Transportation Needs: No Transportation Needs (02/09/2023)  Utilities: Not At Risk (02/09/2023)  Tobacco Use: Unknown (05/15/2023)   Readmission Risk Interventions     No data to display

## 2023-05-21 NOTE — Progress Notes (Signed)
 PROGRESS NOTE    Olivia Burnett  ZOX:[id number] DOB: 21-Nov-1950 DOA: 05/14/2023 PCP: System, Provider Not In    Brief Narrative:   Olivia Burnett is a 73 y.o. female with past medical history significant for HTN, DM2, chronic ITP, CAD, psoriatic arthritis, CKD stage IIIa, CVA with right-sided weakness who presented to [facility] ED on 3/21 from [facility] SNF via EMS nausea, vomiting, generalized bodyaches, fever, of cough, shortness of breath, dysuria, and pleuritic chest pain.  Chest pain worse with deep inspiration, productive cough progressing.  In the ED, temperature 99.4 F, HR 96, RR 16, BP 168/74, SpO2 99% on room air.  WBC 10.9, hemoglobin 11.3, platelet count 117.  Sodium 130, potassium 3.9, chloride 103, CO2 19, BUN 24, creatinine 1.16.  Glucose 353.  High sensitive troponin 9.  Lactic acid 1.2.  Beta-hydroxybutyrate acid 0.07.  COVID/influenza/RSV PCR negative.  Chest x-ray with no active cardiopulmonary disease process.  CT angiogram chest with no evidence of significant pulmonary embolism, mild cardiac enlargement, patchy infiltrates in the lungs greater on the right consistent with multifocal pneumonia.  Patient was started on empiric antibiotics, given antiemetics, analgesics and IV fluids.  TRH consulted for admission.  Assessment & Plan:    Acute hypoxic/hypoxemic respiratory failure Multifocal pneumonia Lying to the ED from SNF with productive cough, shortness of breath, fevers.  Patient with WBC count of 10.9.  CT angiogram chest negative for PE but with patchy infiltrates concerning for multifocal pneumonia.  Completed 5-day course of azithromycin. -- WBC 10.9>>12.5>18.1>12.0>14.6>10.2 -- Blood cultures x 2 3/21: No growth x 5 days -- Blood cultures 3/25: No growth x 3 days -- Continue meropenem 1 g IV every 12 hours, plan 7 day course -- Lasix 40 IV x 1 this morning for vascular congestion noted on chest x-ray -- Continue to wean supplemental oxygen to  maintain SpO2 greater than 92% -- CBC daily  ESBL Klebsiella pneumonia UTI Urinalysis 3/24 with large leukocytes, negative nitrite, rare bacteria, greater than 50 WBCs.  Was initially started on ceftriaxone for multifocal pneumonia as above, urine culture returned back ESBL Klebsiella pneumonia and patient was changed to meropenem on 3/24 -- Meropenem 1 g IV every 12 hours x 7 days (End 4/1)  NSTEMI Given continued pleuritic chest pain, troponin was reordered on 3/25 notably elevated to 1050 in which he was 9 at time of admission.  Previous NSTEMI in 2019 with PCI/DES to left circumflex.  EKG with atrial fibrillation with rate 116 with no concerning ST elevation/depression or T wave inversion. -- Cardiology following, appreciate assistance -- Troponin 9>1050>920>625>514 -- Heparin drip  Paroxysmal atrial fibrillation with RVR CHA2DS2-VASc score = 7.  TTE with LVEF 60 to 65%, no LV regional wall motion abnormalities, mild LVH, LA moderately dilated, noted ascending aortic aneurysm 41 mm, IVC dilated. -- Cardiology following, appreciate assistance -- metoprolol tartrate 25 mg PO q6h -- Heparin drip -- Continue monitor on telemetry  Ascending aortic aneurysm TTE with noted ascending aortic aneurysm 41 mm.  Continue outpatient surveillance.  Acute metabolic encephalopathy Etiology likely multifactorial in the setting of multifocal pneumonia, ESBL Klebsiella pneumonia UTI. -- Continue as above for UTI/pneumonia -- EEG: With no signs of epileptiform or seizure activity -- Supportive care  Acute renal failure on CKD stage IIIa Acute metabolic acidosis -- Creatinine 1.16>>2.03>1.58>1.37>1.37>1.07 -- Continue IV bicarbonate drip -- Avoid nephrotoxins, renal dose all medications -- BMP daily  Type 2 diabetes mellitus, with hyperglycemia Hemoglobin C 8.2% December 2024, not optimally controlled. --  Lantus 15 units subcutaneously daily -- Sensitive SSI for coverage -- CBG before every  meal/at bedtime  Essential hypertension -- Metoprolol tartrate 25 mg p.o. twice daily -- Isosorbide mononitrate 30 mg p.o. daily  Chronic ITP -- Platelet count 206, stable  Psoriatic arthritis -- Plaquenil 200 mg p.o. daily  Multivessel CAD  Cardiac catheterization January 2019 with DES to left circumflex, balloon angioplasty to ramus intermedius, CTO of RCA and noted diffuse LAD disease 40-50%. -- Heparin drip -- Isosorbide mononitrate 30 mg p.o. daily -- Aspirin 81 mg p.o. daily -- Atorvastatin 10 mg p.o. daily  Questionable seizure disorder Tremor, abnormal eye movements Patient reports that she was recently taken off her antiepileptics. -- EEG: With no seizure or epileptiform discharges noted -- Started on Depakote after conversation with neurology by previous hospitalist Dr Sunnie Nielsen -- Outpatient follow-up with neurology  Depression/anxiety -- Sertraline 75 mg p.o. daily  History of CVA with residual right-sided weakness Currently resident at [facility], SNF, apparently bedbound given significant decline since December 2024 and that apparently bedbound/wheelchair dependent. -- Continue aspirin, statin -- Supportive care -- Plan to return to SNF at time of discharge  Goals of care: Patient with significant decline since October/November 2024 now residing in SNF.  Long conversation with multiple family members including son, Trey Paula on 05/20/2023 who wish upon patient's return to [facility]; that should be under hospice care. -- TOC consulted for hospice referral  DVT prophylaxis:     Code Status: Limited: Do not attempt resuscitation (DNR) -DNR-LIMITED -Do Not Intubate/DNI  Family Communication: No family present bedside this morning  Disposition Plan:  Level of care: Telemetry Status is: Inpatient Remains inpatient appropriate because: IV heparin, IV antibiotics    Consultants:  Cardiology  Procedures:  TTE:  EEG:   Antimicrobials:  Ceftriaxone 3/21  - 3/23 Azithromycin 3/22 - 3/26 Meropenem 3/24>>   Subjective: Patient seen examined bedside, lying in bed.  Sleeping but easily arousable.  Remains pleasantly confused. No specific complaints this morning other than generalized weakness/body aches.  Heart rate controlled on increased dose of metoprolol; remains off of Cardizem drip. No family present at bedside; but extensive discussion with multiple family members including patient's son Trey Paula yesterday who wishes patient to discharge back to [facility] after hospitalization under hospice care.  TOC consulted.  No other specific complaints or concerns at this time.  Denies headache, no abdominal pain, no fever/chills, no nausea/vomiting/diarrhea.  Discussed with RN.  No other acute concerns overnight per nursing staff.  Stable for transfer to telemetry floor.  Objective: Vitals:   05/21/23 0500 05/21/23 0600 05/21/23 0700 05/21/23 0857  BP: (!) 153/81 (!) 140/81 (!) 149/74   Pulse:      Resp: 16 18 17    Temp:    97.6 F (36.4 C)  TempSrc:    Axillary  SpO2:      Weight:      Height:        Intake/Output Summary (Last 24 hours) at 05/21/2023 0931 Last data filed at 05/21/2023 0800 Gross per 24 hour  Intake 706.37 ml  Output 1800 ml  Net -1093.63 ml   Filed Weights   05/15/23 0010 05/15/23 1100  Weight: 74.8 kg 72.4 kg    Examination:  Physical Exam: GEN: NAD, alert confused; oriented to person/place, chronically ill appearance, appears older than stated age HEENT: NCAT, PERRL, EOMI, sclera clear, MMM PULM: Breath sounds slight diminished bilateral bases, no wheezing/crackles, normal respiratory effort without accessory muscle use, on 4  L nasal cannula with SpO2 94% at rest. CV: RRR w/o M/G/R GI: abd soft, NTND, NABS, no R/G/M MSK: Moves all extremities independently NEURO: No focal neurological deficits appreciated  Integumentary: No concerning rashes/lesions/wounds noted on exposed skin surface.    Data Reviewed: I  have personally reviewed following labs and imaging studies  CBC: Recent Labs  Lab 05/14/23 2122 05/15/23 0527 05/17/23 0435 05/18/23 0458 05/19/23 0515 05/20/23 0012 05/21/23 0407  WBC 10.9*   < > 10.8* 18.1* 12.0* 14.6* 10.2  NEUTROABS 8.5*  --   --   --   --   --   --   HGB 11.3*   < > 9.5* 10.6* 9.7* 9.4* 9.8*  HCT 32.1*   < > 28.6* 33.4* 29.5* 28.6* 31.5*  MCV 88.7   < > 92.3 95.4 92.8 92.3 96.9  PLT 117*   < > 142* 179 206 225 191   < > = values in this interval not displayed.   Basic Metabolic Panel: Recent Labs  Lab 05/18/23 0458 05/18/23 1656 05/19/23 0638 05/20/23 0012 05/21/23 0407  NA 132* 129* 131* 130* 135  K 4.2 4.1 3.5 3.6 3.4*  CL 106 98 96* 96* 101  CO2 14* 18* 23 25 25   GLUCOSE 184* 143* 117* 181* 169*  BUN 42* 43* 38* 31* 24*  CREATININE 1.61* 1.58* 1.37* 1.37* 1.07*  CALCIUM 8.7* 8.3* 8.2* 8.1* 8.1*  MG  --   --   --  2.8*  --   PHOS  --   --   --  2.5  --    GFR: Estimated Creatinine Clearance: 46.7 mL/min (A) (by C-G formula based on SCr of 1.07 mg/dL (H)). Liver Function Tests: Recent Labs  Lab 05/14/23 2122  AST 21  ALT 12  ALKPHOS 68  BILITOT 0.7  PROT 7.1  ALBUMIN 4.0   No results for input(s): "LIPASE", "AMYLASE" in the last 168 hours. Recent Labs  Lab 05/15/23 1536  AMMONIA 23   Coagulation Profile: Recent Labs  Lab 05/14/23 2135  INR 1.1   Cardiac Enzymes: No results for input(s): "CKTOTAL", "CKMB", "CKMBINDEX", "TROPONINI" in the last 168 hours. BNP (last 3 results) No results for input(s): "PROBNP" in the last 8760 hours. HbA1C: No results for input(s): "HGBA1C" in the last 72 hours. CBG: Recent Labs  Lab 05/20/23 0748 05/20/23 1102 05/20/23 1546 05/20/23 2135 05/21/23 0806  GLUCAP 155* 201* 205* 225* 130*   Lipid Profile: No results for input(s): "CHOL", "HDL", "LDLCALC", "TRIG", "CHOLHDL", "LDLDIRECT" in the last 72 hours. Thyroid Function Tests: Recent Labs    05/20/23 0012  TSH 0.519   Anemia  Panel: No results for input(s): "VITAMINB12", "FOLATE", "FERRITIN", "TIBC", "IRON", "RETICCTPCT" in the last 72 hours. Sepsis Labs: Recent Labs  Lab 05/14/23 2128 05/18/23 1656 05/18/23 1822  LATICACIDVEN 1.2 2.9* 1.6    Recent Results (from the past 240 hours)  Blood Culture (routine x 2)     Status: None   Collection Time: 05/14/23  9:22 PM   Specimen: BLOOD  Result Value Ref Range Status   Specimen Description   Final    BLOOD LEFT ANTECUBITAL Performed at [facility], [address], [place], [place] [address]    Special Requests   Final    BOTTLES DRAWN AEROBIC AND ANAEROBIC Blood Culture results may not be optimal due to an inadequate volume of blood received in culture bottles Performed at [facility], [address] [place], [place] [address]  Culture   Final    NO GROWTH 5 DAYS Performed at [facility], [address], [place], [place] [address]    Report Status 05/20/2023 FINAL  Final  Blood Culture (routine x 2)     Status: None   Collection Time: 05/14/23  9:50 PM   Specimen: BLOOD RIGHT ARM  Result Value Ref Range Status   Specimen Description   Final    BLOOD RIGHT ARM Performed at [facility], [address], [place], [place] [address]    Special Requests   Final    BOTTLES DRAWN AEROBIC AND ANAEROBIC Blood Culture results may not be optimal due to an inadequate volume of blood received in culture bottles Performed at [facility], [address], [place], [place] [address]    Culture   Final    NO GROWTH 5 DAYS Performed at [facility], [address] [place], [place] [address]    Report Status 05/20/2023 FINAL  Final  Resp panel by RT-PCR (RSV, Flu A&B, Covid) Anterior Nasal Swab     Status: None   Collection Time: 05/14/23 10:00 PM   Specimen: Anterior Nasal Swab  Result Value Ref Range Status   SARS Coronavirus 2 by RT PCR NEGATIVE NEGATIVE Final     Comment: (NOTE) SARS-CoV-2 target nucleic acids are NOT DETECTED.  The SARS-CoV-2 RNA is generally detectable in upper respiratory specimens during the acute phase of infection. The lowest concentration of SARS-CoV-2 viral copies this assay can detect is 138 copies/mL. A negative result does not preclude SARS-Cov-2 infection and should not be used as the sole basis for treatment or other patient management decisions. A negative result may occur with  improper specimen collection/handling, submission of specimen other than nasopharyngeal swab, presence of viral mutation(s) within the areas targeted by this assay, and inadequate number of viral copies(<138 copies/mL). A negative result must be combined with clinical observations, patient history, and epidemiological information. The expected result is Negative.  Fact Sheet for Patients:  BloggerCourse.com  Fact Sheet for Healthcare Providers:  SeriousBroker.it  This test is no t yet approved or cleared by the [place] FDA and  has been authorized for detection and/or diagnosis of SARS-CoV-2 by FDA under an Emergency Use Authorization (EUA). This EUA will remain  in effect (meaning this test can be used) for the duration of the COVID-19 declaration under Section 564(b)(1) of the Act, 21 U.S.C.section 360bbb-3(b)(1), unless the authorization is terminated  or revoked sooner.       Influenza A by PCR NEGATIVE NEGATIVE Final   Influenza B by PCR NEGATIVE NEGATIVE Final    Comment: (NOTE) The Xpert Xpress SARS-CoV-2/FLU/RSV plus assay is intended as an aid in the diagnosis of influenza from Nasopharyngeal swab specimens and should not be used as a sole basis for treatment. Nasal washings and aspirates are unacceptable for Xpert Xpress SARS-CoV-2/FLU/RSV testing.  Fact Sheet for Patients: BloggerCourse.com  Fact Sheet for Healthcare  Providers: SeriousBroker.it  This test is not yet approved or cleared by the [place] FDA and has been authorized for detection and/or diagnosis of SARS-CoV-2 by FDA under an Emergency Use Authorization (EUA). This EUA will remain in effect (meaning this test can be used) for the duration of the COVID-19 declaration under Section 564(b)(1) of the Act, 21 U.S.C. section 360bbb-3(b)(1), unless the authorization is terminated or revoked.     Resp Syncytial Virus by PCR NEGATIVE NEGATIVE Final    Comment: (NOTE) Fact  Sheet for Patients: BloggerCourse.com  Fact Sheet for Healthcare Providers: SeriousBroker.it  This test is not yet approved or cleared by the [place] FDA and has been authorized for detection and/or diagnosis of SARS-CoV-2 by FDA under an Emergency Use Authorization (EUA). This EUA will remain in effect (meaning this test can be used) for the duration of the COVID-19 declaration under Section 564(b)(1) of the Act, 21 U.S.C. section 360bbb-3(b)(1), unless the authorization is terminated or revoked.  Performed at [facility], [address], [place], [place] [address]   Urine Culture     Status: Abnormal   Collection Time: 05/15/23 12:29 AM   Specimen: Urine, Random  Result Value Ref Range Status   Specimen Description   Final    URINE, RANDOM Performed at [facility], [address], [place], [place] [address]    Special Requests   Final    NONE Reflexed from [id number] Performed at [facility], [address], [place], [place] [address]    Culture (A)  Final    >=100,000 COLONIES/mL KLEBSIELLA PNEUMONIAE Confirmed Extended Spectrum Beta-Lactamase Producer (ESBL).  In bloodstream infections from ESBL organisms, carbapenems are preferred over piperacillin/tazobactam. They are shown to have a lower risk of mortality.     Report Status 05/17/2023 FINAL  Final   Organism ID, Bacteria KLEBSIELLA PNEUMONIAE (A)  Final      Susceptibility   Klebsiella pneumoniae - MIC*    AMPICILLIN >=32 RESISTANT Resistant     CEFAZOLIN >=64 RESISTANT Resistant     CEFEPIME >=32 RESISTANT Resistant     CEFTRIAXONE >=64 RESISTANT Resistant     CIPROFLOXACIN <=0.25 SENSITIVE Sensitive     GENTAMICIN <=1 SENSITIVE Sensitive     IMIPENEM <=0.25 SENSITIVE Sensitive     NITROFURANTOIN <=16 SENSITIVE Sensitive     TRIMETH/SULFA <=20 SENSITIVE Sensitive     AMPICILLIN/SULBACTAM 16 INTERMEDIATE Intermediate     PIP/TAZO <=4 SENSITIVE Sensitive ug/mL    * >=100,000 COLONIES/mL KLEBSIELLA PNEUMONIAE  MRSA Next Gen by PCR, Nasal     Status: None   Collection Time: 05/15/23  5:40 PM   Specimen: Nasal Mucosa; Nasal Swab  Result Value Ref Range Status   MRSA by PCR Next Gen NOT DETECTED NOT DETECTED Final    Comment: (NOTE) The GeneXpert MRSA Assay (FDA approved for NASAL specimens only), is one component of a comprehensive MRSA colonization surveillance program. It is not intended to diagnose MRSA infection nor to guide or monitor treatment for MRSA infections. Test performance is not FDA approved in patients less than 49 years old. Performed at [facility], [address], [place], [place] [address]   MRSA Next Gen by PCR, Nasal     Status: None   Collection Time: 05/18/23 10:59 AM   Specimen: Nasal Mucosa; Nasal Swab  Result Value Ref Range Status   MRSA by PCR Next Gen NOT DETECTED NOT DETECTED Final    Comment: (NOTE) The GeneXpert MRSA Assay (FDA approved for NASAL specimens only), is one component of a comprehensive MRSA colonization surveillance program. It is not intended to diagnose MRSA infection nor to guide or monitor treatment for MRSA infections. Test performance is not FDA approved in patients less than 60 years old. Performed at [facility], [address] [place], [place] [address]   Culture, blood (Routine X 2) w Reflex to ID Panel     Status: None (Preliminary result)   Collection Time: 05/18/23  4:56  PM   Specimen: BLOOD  Result Value Ref Range Status   Specimen Description   Final    BLOOD BLOOD RIGHT HAND Performed at [facility], [address], [place], [place] [address]    Special Requests   Final    BOTTLES DRAWN AEROBIC ONLY Blood Culture results may not be optimal due to an inadequate volume of blood received in culture bottles Performed at [facility], [address], [place], [place] [address]    Culture   Final    NO GROWTH 3 DAYS Performed at [facility], [address] [place], [place] [address]    Report Status PENDING  Incomplete  Culture, blood (Routine X 2) w Reflex to ID Panel     Status: None (Preliminary result)   Collection Time: 05/18/23  5:03 PM   Specimen: BLOOD  Result Value Ref Range Status   Specimen Description   Final    BLOOD BLOOD RIGHT HAND Performed at [facility], [address], [place], [place] [address]    Special Requests   Final    BOTTLES DRAWN AEROBIC ONLY Blood Culture results may not be optimal due to an inadequate volume of blood received in culture bottles Performed at [facility], [address] [place], [place] [address]    Culture   Final    NO GROWTH 3 DAYS Performed at [facility], [address] [place], [place] [address]    Report Status PENDING  Incomplete         Radiology Studies: EEG adult Result Date: 05/19/2023 Charlsie Quest, MD     05/19/2023  8:35 PM Patient Name: Olivia Burnett MRN: [id number] Epilepsy Attending: Charlsie Quest Referring Physician/Provider: Alba Cory, MD Date: 05/19/2023 Duration: 24.22 mins Patient history: 72yo F with ams. EEG to evaluate for seizure Level of alertness: Awake AEDs during EEG study: GBP, VPA Technical aspects: This  EEG study was done with scalp electrodes positioned according to the 10-20 International system of electrode placement. Electrical activity was reviewed with band pass filter of 1-70Hz , sensitivity of 7 uV/mm, display speed of 16mm/sec with a 60Hz  notched filter applied as appropriate. EEG data were recorded continuously and digitally stored.  Video monitoring was available and reviewed as appropriate. Description: The posterior dominant rhythm consists of 7.5 Hz activity of moderate voltage (25-35 uV) seen predominantly in posterior head regions, symmetric and reactive to eye opening and eye closing. EEG showed intermittent generalized 3 to 6 Hz theta-delta slowing. Hyperventilation and photic stimulation were not performed.    ABNORMALITY - Intermittent slow, generalized  IMPRESSION: This study is suggestive of mild diffuse encephalopathy. No seizures or epileptiform discharges were seen throughout the recording.  Charlsie Quest   ECHOCARDIOGRAM COMPLETE Result Date: 05/19/2023    ECHOCARDIOGRAM REPORT   Patient Name:   Olivia Burnett Date of Exam: 05/19/2023 Medical Rec #:  [id number]         Height:       65.0 in Accession #:    [id number]        Weight:       159.6 lb Date of Birth:  07/31/1950         BSA:          1.797 m Patient Age:    72 years          BP:           113/81  mmHg Patient Gender: F                 HR:           88 bpm. Exam Location:  Inpatient Procedure: 2D Echo, Cardiac Doppler, Color Doppler and Intracardiac            Opacification Agent (Both Spectral and Color Flow Doppler were            utilized during procedure). Indications:    Chest Pain  History:        Patient has prior history of Echocardiogram examinations. CAD,                 Stroke; Risk Factors:Diabetes and Hypertension.  Sonographer:    Lamont Snowball Referring Phys: [id number] Darshawn Boateng J [place]  Sonographer Comments: Patient is obese. Image acquisition challenging due to respiratory motion. IMPRESSIONS  1. Left ventricular  ejection fraction, by estimation, is 60 to 65%. The left ventricle has normal function. The left ventricle has no regional wall motion abnormalities. There is mild left ventricular hypertrophy. Left ventricular diastolic parameters are indeterminate.  2. Right ventricular systolic function is normal. The right ventricular size is normal.  3. Left atrial size was moderately dilated.  4. The mitral valve is normal in structure. No evidence of mitral valve regurgitation. No evidence of mitral stenosis.  5. The aortic valve is normal in structure. Aortic valve regurgitation is not visualized. No aortic stenosis is present.  6. Aortic dilatation noted. There is mild dilatation of the ascending aorta, measuring 41 mm.  7. The inferior vena cava is dilated in size with <50% respiratory variability, suggesting right atrial pressure of 15 mmHg. FINDINGS  Left Ventricle: Left ventricular ejection fraction, by estimation, is 60 to 65%. The left ventricle has normal function. The left ventricle has no regional wall motion abnormalities. The left ventricular internal cavity size was normal in size. There is  mild left ventricular hypertrophy. Left ventricular diastolic parameters are indeterminate. Right Ventricle: The right ventricular size is normal. No increase in right ventricular wall thickness. Right ventricular systolic function is normal. Left Atrium: Left atrial size was moderately dilated. Right Atrium: Right atrial size was normal in size. Pericardium: There is no evidence of pericardial effusion. Mitral Valve: The mitral valve is normal in structure. No evidence of mitral valve regurgitation. No evidence of mitral valve stenosis. MV peak gradient, 6.9 mmHg. The mean mitral valve gradient is 3.0 mmHg. Tricuspid Valve: The tricuspid valve is normal in structure. Tricuspid valve regurgitation is not demonstrated. No evidence of tricuspid stenosis. Aortic Valve: The aortic valve is normal in structure. Aortic valve  regurgitation is not visualized. No aortic stenosis is present. Aortic valve peak gradient measures 6.5 mmHg. Pulmonic Valve: The pulmonic valve was normal in structure. Pulmonic valve regurgitation is trivial. No evidence of pulmonic stenosis. Aorta: Aortic dilatation noted. There is mild dilatation of the ascending aorta, measuring 41 mm. Venous: The inferior vena cava is dilated in size with less than 50% respiratory variability, suggesting right atrial pressure of 15 mmHg. IAS/Shunts: No atrial level shunt detected by color flow Doppler.  LEFT VENTRICLE PLAX 2D LVIDd:         4.60 cm LVIDs:         3.00 cm LV PW:         1.10 cm LV IVS:        1.20 cm LVOT diam:     2.00 cm LV SV:  34 LV SV Index:   19 LVOT Area:     3.14 cm  RIGHT VENTRICLE          IVC RV Basal diam:  3.00 cm  IVC diam: 2.50 cm LEFT ATRIUM             Index        RIGHT ATRIUM           Index LA diam:        4.10 cm 2.28 cm/m   RA Area:     13.10 cm LA Vol (A2C):   85.7 ml 47.68 ml/m  RA Volume:   28.30 ml  15.75 ml/m LA Vol (A4C):   72.6 ml 40.39 ml/m LA Biplane Vol: 80.0 ml 44.51 ml/m  AORTIC VALVE AV Area (Vmax): 1.72 cm AV Vmax:        127.67 cm/s AV Peak Grad:   6.5 mmHg LVOT Vmax:      69.97 cm/s LVOT Vmean:     48.133 cm/s LVOT VTI:       0.109 m  AORTA Ao Root diam: 2.90 cm Ao Asc diam:  4.10 cm MITRAL VALVE MV Area (PHT): 4.89 cm     SHUNTS MV Area VTI:   1.58 cm     Systemic VTI:  0.11 m MV Peak grad:  6.9 mmHg     Systemic Diam: 2.00 cm MV Mean grad:  3.0 mmHg MV Vmax:       1.32 m/s MV Vmean:      80.6 cm/s MV E velocity: 113.00 cm/s Donato Schultz MD Electronically signed by Donato Schultz MD Signature Date/Time: 05/19/2023/4:11:30 PM    Final         Scheduled Meds:  aspirin EC  81 mg Oral Daily   atorvastatin  10 mg Oral QHS   Chlorhexidine Gluconate Cloth  6 each Topical Daily   gabapentin  200 mg Oral BID   hydroxychloroquine  200 mg Oral Daily   insulin aspart  0-5 Units Subcutaneous QHS   insulin  aspart  0-9 Units Subcutaneous TID WC   insulin glargine  15 Units Subcutaneous QHS   isosorbide mononitrate  30 mg Oral Daily   linaclotide  145 mcg Oral Daily   metoprolol tartrate  25 mg Oral QID   pantoprazole  40 mg Oral Daily   potassium chloride  30 mEq Oral Q3H   sertraline  75 mg Oral Daily   valproic acid  500 mg Oral TID   Continuous Infusions:  diltiazem (CARDIZEM) infusion Stopped (05/20/23 1606)   heparin 1,050 Units/hr (05/20/23 2123)   meropenem (MERREM) IV Stopped (05/20/23 2237)     LOS: 6 days    Time spent: 52 minutes spent on 05/21/2023 caring for this patient face-to-face including chart review, ordering labs/tests, documenting, discussion with nursing staff, consultants, updating family and interview/physical exam    Alvira Philips [place], DO [facility] Available via Epic secure chat 7am-7pm After these hours, please refer to coverage provider listed on [email or web address] 05/21/2023, 9:31 AM

## 2023-05-22 DIAGNOSIS — J189 Pneumonia, unspecified organism: Secondary | ICD-10-CM | POA: Diagnosis not present

## 2023-05-22 LAB — CBC
HCT: 33.4 % — ABNORMAL LOW (ref 36.0–46.0)
Hemoglobin: 10.4 g/dL — ABNORMAL LOW (ref 12.0–15.0)
MCH: 29.7 pg (ref 26.0–34.0)
MCHC: 31.1 g/dL (ref 30.0–36.0)
MCV: 95.4 fL (ref 80.0–100.0)
Platelets: 258 10*3/uL (ref 150–400)
RBC: 3.5 MIL/uL — ABNORMAL LOW (ref 3.87–5.11)
RDW: 13.8 % (ref 11.5–15.5)
WBC: 9 10*3/uL (ref 4.0–10.5)
nRBC: 0 % (ref 0.0–0.2)

## 2023-05-22 LAB — BASIC METABOLIC PANEL WITH GFR
Anion gap: 11 (ref 5–15)
BUN: 16 mg/dL (ref 8–23)
CO2: 27 mmol/L (ref 22–32)
Calcium: 8.8 mg/dL — ABNORMAL LOW (ref 8.9–10.3)
Chloride: 101 mmol/L (ref 98–111)
Creatinine, Ser: 0.88 mg/dL (ref 0.44–1.00)
GFR, Estimated: >60 mL/min (ref >60)
Glucose, Bld: 165 mg/dL — ABNORMAL HIGH (ref 70–99)
Potassium: 3.8 mmol/L (ref 3.5–5.1)
Sodium: 139 mmol/L (ref 135–145)

## 2023-05-22 LAB — MAGNESIUM: Magnesium: 2.2 mg/dL (ref 1.7–2.4)

## 2023-05-22 LAB — GLUCOSE, CAPILLARY
Glucose-Capillary: 140 mg/dL — ABNORMAL HIGH (ref 70–99)
Glucose-Capillary: 185 mg/dL — ABNORMAL HIGH (ref 70–99)
Glucose-Capillary: 157 mg/dL — ABNORMAL HIGH (ref 70–99)
Glucose-Capillary: 193 mg/dL — ABNORMAL HIGH (ref 70–99)

## 2023-05-22 MED ORDER — SODIUM CHLORIDE 0.9 % IV SOLN
1.0000 g | Freq: Three times a day (TID) | INTRAVENOUS | Status: AC
  Administered 2023-05-22 – 2023-05-23 (×3): 1 g via INTRAVENOUS
  Filled 2023-05-22 (×3): qty 20

## 2023-05-22 MED ORDER — METOPROLOL TARTRATE 25 MG PO TABS
25.0000 mg | ORAL_TABLET | Freq: Once | ORAL | Status: AC
  Administered 2023-05-22: 25 mg via ORAL
  Filled 2023-05-22: qty 1

## 2023-05-22 MED ORDER — METOPROLOL TARTRATE 5 MG/5ML IV SOLN
5.0000 mg | Freq: Once | INTRAVENOUS | Status: AC
  Administered 2023-05-22: 5 mg via INTRAVENOUS
  Filled 2023-05-22: qty 5

## 2023-05-22 MED ORDER — APIXABAN 5 MG PO TABS
5.0000 mg | ORAL_TABLET | Freq: Two times a day (BID) | ORAL | Status: DC
  Administered 2023-05-22 – 2023-05-24 (×5): 5 mg via ORAL
  Filled 2023-05-22 (×5): qty 1

## 2023-05-22 MED ORDER — FUROSEMIDE 10 MG/ML IJ SOLN
40.0000 mg | Freq: Once | INTRAMUSCULAR | Status: AC
  Administered 2023-05-22: 40 mg via INTRAVENOUS
  Filled 2023-05-22: qty 4

## 2023-05-22 MED ORDER — METOPROLOL TARTRATE 50 MG PO TABS
50.0000 mg | ORAL_TABLET | Freq: Two times a day (BID) | ORAL | Status: DC
  Administered 2023-05-22 – 2023-05-24 (×4): 50 mg via ORAL
  Filled 2023-05-22 (×4): qty 1

## 2023-05-22 NOTE — Progress Notes (Signed)
 PHARMACY NOTE:  ANTIMICROBIAL RENAL DOSAGE ADJUSTMENT  Current antimicrobial regimen includes a mismatch between antimicrobial dosage and estimated renal function.  As per policy approved by the Pharmacy & Therapeutics and Medical Executive Committees, the antimicrobial dosage will be adjusted accordingly.  Current antimicrobial dosage:  Merrem 1g IV q12h  Indication: PNA  Renal Function:  Estimated Creatinine Clearance: 56.8 mL/min (by C-G formula based on SCr of 0.88 mg/dL). []      On intermittent HD, scheduled: []      On CRRT    Antimicrobial dosage has been changed to:  Merrem 1g IV q8h  Additional comments: N/A   Thank you for allowing pharmacy to be a part of this patient's care.  Cherylin Mylar, PharmD Clinical Pharmacist  3/29/20253:07 PM

## 2023-05-22 NOTE — Progress Notes (Signed)
 MEWS Progress Note  Patient Details Name: Olivia Burnett MRN: [id number] DOB: 25-Aug-1950 Today's Date: 05/22/2023   MEWS Flowsheet Documentation:  Assess: MEWS Score Temp: 98.7 F (37.1 C) BP: (!) 156/84 MAP (mmHg): 105 Pulse Rate: 65 ECG Heart Rate: (!) 144 Resp: 20 Level of Consciousness: Alert SpO2: 98 % O2 Device: Nasal Cannula Patient Activity (if Appropriate): In bed Heater temperature: 87.8 F (31 C) O2 Flow Rate (L/min): 2 L/min FiO2 (%): 36 % Assess: MEWS Score MEWS Temp: 0 MEWS Systolic: 0 MEWS Pulse: 3 MEWS RR: 0 MEWS LOC: 0 MEWS Score: 3 MEWS Score Color: Yellow Assess: SIRS CRITERIA SIRS Temperature : 0 SIRS Respirations : 0 SIRS Pulse: 1 SIRS WBC: 0 SIRS Score Sum : 1 SIRS Temperature : 0 SIRS Pulse: 1 SIRS Respirations : 0 SIRS WBC: 0 SIRS Score Sum : 1 Assess: if the MEWS score is Yellow or Red Were vital signs accurate and taken at a resting state?: Yes Does the patient meet 2 or more of the SIRS criteria?: No MEWS guidelines implemented : Yes, yellow Treat MEWS Interventions: Considered administering scheduled or prn medications/treatments as ordered Take Vital Signs Increase Vital Sign Frequency : Yellow: Q2hr x1, continue Q4hrs until patient remains green for 12hrs Escalate MEWS: Escalate: Yellow: Discuss with charge nurse and consider notifying provider and/or RRT        [name] 05/22/2023, 2:09 PM

## 2023-05-22 NOTE — Progress Notes (Signed)
 PROGRESS NOTE    Olivia Burnett  ZOX:[id number] DOB: November 26, 1950 DOA: 05/14/2023 PCP: System, Provider Not In    Brief Narrative:   Olivia Burnett is a 73 y.o. female with past medical history significant for HTN, DM2, chronic ITP, CAD, psoriatic arthritis, CKD stage IIIa, CVA with right-sided weakness who presented to [facility] ED on 3/21 from [facility] SNF via EMS nausea, vomiting, generalized bodyaches, fever, of cough, shortness of breath, dysuria, and pleuritic chest pain.  Chest pain worse with deep inspiration, productive cough progressing.  In the ED, temperature 99.4 F, HR 96, RR 16, BP 168/74, SpO2 99% on room air.  WBC 10.9, hemoglobin 11.3, platelet count 117.  Sodium 130, potassium 3.9, chloride 103, CO2 19, BUN 24, creatinine 1.16.  Glucose 353.  High sensitive troponin 9.  Lactic acid 1.2.  Beta-hydroxybutyrate acid 0.07.  COVID/influenza/RSV PCR negative.  Chest x-ray with no active cardiopulmonary disease process.  CT angiogram chest with no evidence of significant pulmonary embolism, mild cardiac enlargement, patchy infiltrates in the lungs greater on the right consistent with multifocal pneumonia.  Patient was started on empiric antibiotics, given antiemetics, analgesics and IV fluids.  TRH consulted for admission.  Assessment & Plan:    Acute hypoxic/hypoxemic respiratory failure Multifocal pneumonia Lying to the ED from SNF with productive cough, shortness of breath, fevers.  Patient with WBC count of 10.9.  CT angiogram chest negative for PE but with patchy infiltrates concerning for multifocal pneumonia.  Completed 5-day course of azithromycin. -- WBC 10.9>>12.5>18.1>12.0>14.6>10.2>9.0 -- Blood cultures x 2 3/21: No growth x 5 days -- Blood cultures 3/25: No growth x 4 days -- Continue meropenem 1 g IV q12h, plan 7 day course -- Repeat Lasix 40 IV x 1 this morning  -- Continue to wean supplemental oxygen to maintain SpO2 greater than 92% -- CBC  daily  ESBL Klebsiella pneumonia UTI Urinalysis 3/24 with large leukocytes, negative nitrite, rare bacteria, greater than 50 WBCs.  Was initially started on ceftriaxone for multifocal pneumonia as above, urine culture returned back ESBL Klebsiella pneumonia and patient was changed to meropenem on 3/24 -- Meropenem 1 g IV every 12 hours x 7 days (End 3/30)  NSTEMI Given continued pleuritic chest pain, troponin was reordered on 3/25 notably elevated to 1050 in which he was 9 at time of admission.  Previous NSTEMI in 2019 with PCI/DES to left circumflex.  EKG with atrial fibrillation with rate 116 with no concerning ST elevation/depression or T wave inversion.  Seen by cardiology during hospitalization most likely cough secondary to demand ischemia in the setting of sepsis, A-fib with RVR with known significant CAD history.  Patient was initially maintained on a heparin drip but no plans for further testing per cardiology and now signed off.  Heparin drip transition to Eliquis. --Outpatient follow-up with cardiology  Paroxysmal atrial fibrillation with RVR CHA2DS2-VASc score = 7.  TTE with LVEF 60 to 65%, no LV regional wall motion abnormalities, mild LVH, LA moderately dilated, noted ascending aortic aneurysm 41 mm, IVC dilated.  Seen by cardiology during hospitalization, now signed off. -- metoprolol tartrate 50mg  PO q12h -- Eliquis -- Continue monitor on telemetry  Ascending aortic aneurysm TTE with noted ascending aortic aneurysm 41 mm.  Continue outpatient surveillance.  Acute metabolic encephalopathy Etiology likely multifactorial in the setting of multifocal pneumonia, ESBL Klebsiella pneumonia UTI. -- Continue as above for UTI/pneumonia -- EEG: With no signs of epileptiform or seizure activity -- Supportive care  Acute renal  failure on CKD stage IIIa Acute metabolic acidosis: Resolved -- Creatinine 1.16>>2.03>1.58>1.37>1.37>1.07>0.88 -- Avoid nephrotoxins, renal dose all  medications -- BMP daily  Type 2 diabetes mellitus, with hyperglycemia Hemoglobin A1C 8.2% December 2024, not optimally controlled. -- Lantus 15 units subcutaneously daily -- Sensitive SSI for coverage -- CBG before every meal/at bedtime  Essential hypertension -- Metoprolol tartrate 50 mg p.o. twice daily -- Isosorbide mononitrate 30 mg p.o. daily  Chronic ITP -- Platelet count 206, stable  Psoriatic arthritis -- Plaquenil 200 mg p.o. daily  Multivessel CAD  Cardiac catheterization January 2019 with DES to left circumflex, balloon angioplasty to ramus intermedius, CTO of RCA and noted diffuse LAD disease 40-50%. -- Isosorbide mononitrate 30 mg p.o. daily -- Aspirin 81 mg p.o. daily -- Atorvastatin 10 mg p.o. daily  Questionable seizure disorder Tremor, abnormal eye movements Patient reports that she was recently taken off her antiepileptics. -- EEG: With no seizure or epileptiform discharges noted -- Started on Depakote after conversation with neurology by previous hospitalist Dr Sunnie Nielsen -- Outpatient follow-up with neurology  Depression/anxiety -- Sertraline 75 mg p.o. daily  History of CVA with residual right-sided weakness Currently resident at [facility] SNF, apparently bedbound given significant decline since December 2024 and that apparently bedbound/wheelchair dependent. -- Continue aspirin, statin -- Supportive care -- Plan to return to SNF at time of discharge with hospice  Goals of care: Patient with significant decline since October/November 2024 now residing in SNF.  Long conversation with multiple family members including son, Trey Paula on 05/20/2023 who wish upon patient's return to [facility]; that should be under hospice care. -- TOC consulted for hospice referral  DVT prophylaxis:  apixaban (ELIQUIS) tablet 5 mg    Code Status: Limited: Do not attempt resuscitation (DNR) -DNR-LIMITED -Do Not Intubate/DNI  Family Communication: No family present  bedside this morning  Disposition Plan:  Level of care: Telemetry Status is: Inpatient Remains inpatient appropriate because: IV antibiotics    Consultants:  Cardiology: signed off 3/28  Procedures:  TTE:  EEG:   Antimicrobials:  Ceftriaxone 3/21 - 3/23 Azithromycin 3/22 - 3/26 Meropenem 3/24>>   Subjective: Patient seen examined bedside, lying in bed.  Watching TV.  No complaints.  Remains pleasantly confused.  No family present at bedside this morning.  Transitioned from heparin drip to Eliquis today.  Cardiology signed off yesterday.  Remains on IV meropenem.  No other specific complaints or concerns at this time.  Denies headache, no abdominal pain, no fever/chills, no nausea/vomiting/diarrhea.  No acute concerns overnight per nursing staff.  Plan for discharge back to SNF under hospice care.   Objective: Vitals:   05/21/23 1634 05/21/23 2006 05/22/23 0005 05/22/23 0454  BP: (!) 151/69 (!) 156/94 (!) 156/114 (!) 152/96  Pulse: 74 (!) 109    Resp:   20 18  Temp: 98.7 F (37.1 C) 98 F (36.7 C) 98.6 F (37 C) 98.4 F (36.9 C)  TempSrc: Oral Oral Oral Oral  SpO2: 94% 95% 94% 97%  Weight:      Height:        Intake/Output Summary (Last 24 hours) at 05/22/2023 1014 Last data filed at 05/22/2023 0955 Gross per 24 hour  Intake 907.63 ml  Output 4701 ml  Net -3793.37 ml   Filed Weights   05/15/23 0010 05/15/23 1100  Weight: 74.8 kg 72.4 kg    Examination:  Physical Exam: GEN: NAD, alert confused; oriented to person/place, chronically ill appearance, appears older than stated age HEENT: NCAT, PERRL,  EOMI, sclera clear, MMM PULM: Breath sounds slight diminished bilateral bases, no wheezing/crackles, normal respiratory effort without accessory muscle use, on 2 L nasal cannula with SpO2 97% at rest. CV: RRR w/o M/G/R GI: abd soft, NTND, NABS, no R/G/M MSK: Moves all extremities independently NEURO: No focal neurological deficits appreciated  Integumentary: No  concerning rashes/lesions/wounds noted on exposed skin surface.    Data Reviewed: I have personally reviewed following labs and imaging studies  CBC: Recent Labs  Lab 05/18/23 0458 05/19/23 0515 05/20/23 0012 05/21/23 0407 05/22/23 0609  WBC 18.1* 12.0* 14.6* 10.2 9.0  HGB 10.6* 9.7* 9.4* 9.8* 10.4*  HCT 33.4* 29.5* 28.6* 31.5* 33.4*  MCV 95.4 92.8 92.3 96.9 95.4  PLT 179 206 225 191 258   Basic Metabolic Panel: Recent Labs  Lab 05/18/23 1656 05/19/23 0638 05/20/23 0012 05/21/23 0407 05/22/23 0609  NA 129* 131* 130* 135 139  K 4.1 3.5 3.6 3.4* 3.8  CL 98 96* 96* 101 101  CO2 18* 23 25 25 27   GLUCOSE 143* 117* 181* 169* 165*  BUN 43* 38* 31* 24* 16  CREATININE 1.58* 1.37* 1.37* 1.07* 0.88  CALCIUM 8.3* 8.2* 8.1* 8.1* 8.8*  MG  --   --  2.8*  --  2.2  PHOS  --   --  2.5  --   --    GFR: Estimated Creatinine Clearance: 56.8 mL/min (by C-G formula based on SCr of 0.88 mg/dL). Liver Function Tests: No results for input(s): "AST", "ALT", "ALKPHOS", "BILITOT", "PROT", "ALBUMIN" in the last 168 hours.  No results for input(s): "LIPASE", "AMYLASE" in the last 168 hours. Recent Labs  Lab 05/15/23 1536  AMMONIA 23   Coagulation Profile: No results for input(s): "INR", "PROTIME" in the last 168 hours.  Cardiac Enzymes: No results for input(s): "CKTOTAL", "CKMB", "CKMBINDEX", "TROPONINI" in the last 168 hours. BNP (last 3 results) No results for input(s): "PROBNP" in the last 8760 hours. HbA1C: No results for input(s): "HGBA1C" in the last 72 hours. CBG: Recent Labs  Lab 05/21/23 0806 05/21/23 1245 05/21/23 1636 05/21/23 2007 05/22/23 0818  GLUCAP 130* 126* 220* 194* 140*   Lipid Profile: No results for input(s): "CHOL", "HDL", "LDLCALC", "TRIG", "CHOLHDL", "LDLDIRECT" in the last 72 hours. Thyroid Function Tests: Recent Labs    05/20/23 0012  TSH 0.519   Anemia Panel: No results for input(s): "VITAMINB12", "FOLATE", "FERRITIN", "TIBC", "IRON",  "RETICCTPCT" in the last 72 hours. Sepsis Labs: Recent Labs  Lab 05/18/23 1656 05/18/23 1822  LATICACIDVEN 2.9* 1.6    Recent Results (from the past 240 hours)  Blood Culture (routine x 2)     Status: None   Collection Time: 05/14/23  9:22 PM   Specimen: BLOOD  Result Value Ref Range Status   Specimen Description   Final    BLOOD LEFT ANTECUBITAL Performed at [facility], [address], [place], [place] [address]    Special Requests   Final    BOTTLES DRAWN AEROBIC AND ANAEROBIC Blood Culture results may not be optimal due to an inadequate volume of blood received in culture bottles Performed at [facility], [address] [place], [place] [address]    Culture   Final    NO GROWTH 5 DAYS Performed at [facility], [address] [place], [place] [address]    Report Status 05/20/2023 FINAL  Final  Blood Culture (routine x 2)     Status: None   Collection Time: 05/14/23  9:50 PM  Specimen: BLOOD RIGHT ARM  Result Value Ref Range Status   Specimen Description   Final    BLOOD RIGHT ARM Performed at [facility], [address] [place], [place] [address]    Special Requests   Final    BOTTLES DRAWN AEROBIC AND ANAEROBIC Blood Culture results may not be optimal due to an inadequate volume of blood received in culture bottles Performed at [facility], [address], [place], [place] [address]    Culture   Final    NO GROWTH 5 DAYS Performed at [facility], [address] [place], [place] [address]    Report Status 05/20/2023 FINAL  Final  Resp panel by RT-PCR (RSV, Flu A&B, Covid) Anterior Nasal Swab     Status: None   Collection Time: 05/14/23 10:00 PM   Specimen: Anterior Nasal Swab  Result Value Ref Range Status   SARS Coronavirus 2 by RT PCR NEGATIVE NEGATIVE Final    Comment: (NOTE) SARS-CoV-2 target nucleic acids are NOT DETECTED.  The SARS-CoV-2 RNA is generally  detectable in upper respiratory specimens during the acute phase of infection. The lowest concentration of SARS-CoV-2 viral copies this assay can detect is 138 copies/mL. A negative result does not preclude SARS-Cov-2 infection and should not be used as the sole basis for treatment or other patient management decisions. A negative result may occur with  improper specimen collection/handling, submission of specimen other than nasopharyngeal swab, presence of viral mutation(s) within the areas targeted by this assay, and inadequate number of viral copies(<138 copies/mL). A negative result must be combined with clinical observations, patient history, and epidemiological information. The expected result is Negative.  Fact Sheet for Patients:  BloggerCourse.com  Fact Sheet for Healthcare Providers:  SeriousBroker.it  This test is no t yet approved or cleared by the [place] FDA and  has been authorized for detection and/or diagnosis of SARS-CoV-2 by FDA under an Emergency Use Authorization (EUA). This EUA will remain  in effect (meaning this test can be used) for the duration of the COVID-19 declaration under Section 564(b)(1) of the Act, 21 U.S.C.section 360bbb-3(b)(1), unless the authorization is terminated  or revoked sooner.       Influenza A by PCR NEGATIVE NEGATIVE Final   Influenza B by PCR NEGATIVE NEGATIVE Final    Comment: (NOTE) The Xpert Xpress SARS-CoV-2/FLU/RSV plus assay is intended as an aid in the diagnosis of influenza from Nasopharyngeal swab specimens and should not be used as a sole basis for treatment. Nasal washings and aspirates are unacceptable for Xpert Xpress SARS-CoV-2/FLU/RSV testing.  Fact Sheet for Patients: BloggerCourse.com  Fact Sheet for Healthcare Providers: SeriousBroker.it  This test is not yet approved or cleared by the [place] FDA  and has been authorized for detection and/or diagnosis of SARS-CoV-2 by FDA under an Emergency Use Authorization (EUA). This EUA will remain in effect (meaning this test can be used) for the duration of the COVID-19 declaration under Section 564(b)(1) of the Act, 21 U.S.C. section 360bbb-3(b)(1), unless the authorization is terminated or revoked.     Resp Syncytial Virus by PCR NEGATIVE NEGATIVE Final    Comment: (NOTE) Fact Sheet for Patients: BloggerCourse.com  Fact Sheet for Healthcare Providers: SeriousBroker.it  This test is not yet approved or cleared by the [place] FDA and has been authorized for detection and/or diagnosis of SARS-CoV-2 by FDA under an Emergency Use Authorization (EUA). This EUA will remain in effect (meaning this test can be used)  for the duration of the COVID-19 declaration under Section 564(b)(1) of the Act, 21 U.S.C. section 360bbb-3(b)(1), unless the authorization is terminated or revoked.  Performed at [facility], [address], [place], [place] [address]   Urine Culture     Status: Abnormal   Collection Time: 05/15/23 12:29 AM   Specimen: Urine, Random  Result Value Ref Range Status   Specimen Description   Final    URINE, RANDOM Performed at [facility], [address], [place], [place] [address]    Special Requests   Final    NONE Reflexed from [id number] Performed at [facility], [address] [place], [place] [address]    Culture (A)  Final    >=100,000 COLONIES/mL KLEBSIELLA PNEUMONIAE Confirmed Extended Spectrum Beta-Lactamase Producer (ESBL).  In bloodstream infections from ESBL organisms, carbapenems are preferred over piperacillin/tazobactam. They are shown to have a lower risk of mortality.    Report Status 05/17/2023 FINAL  Final   Organism ID, Bacteria KLEBSIELLA PNEUMONIAE (A)  Final      Susceptibility    Klebsiella pneumoniae - MIC*    AMPICILLIN >=32 RESISTANT Resistant     CEFAZOLIN >=64 RESISTANT Resistant     CEFEPIME >=32 RESISTANT Resistant     CEFTRIAXONE >=64 RESISTANT Resistant     CIPROFLOXACIN <=0.25 SENSITIVE Sensitive     GENTAMICIN <=1 SENSITIVE Sensitive     IMIPENEM <=0.25 SENSITIVE Sensitive     NITROFURANTOIN <=16 SENSITIVE Sensitive     TRIMETH/SULFA <=20 SENSITIVE Sensitive     AMPICILLIN/SULBACTAM 16 INTERMEDIATE Intermediate     PIP/TAZO <=4 SENSITIVE Sensitive ug/mL    * >=100,000 COLONIES/mL KLEBSIELLA PNEUMONIAE  MRSA Next Gen by PCR, Nasal     Status: None   Collection Time: 05/15/23  5:40 PM   Specimen: Nasal Mucosa; Nasal Swab  Result Value Ref Range Status   MRSA by PCR Next Gen NOT DETECTED NOT DETECTED Final    Comment: (NOTE) The GeneXpert MRSA Assay (FDA approved for NASAL specimens only), is one component of a comprehensive MRSA colonization surveillance program. It is not intended to diagnose MRSA infection nor to guide or monitor treatment for MRSA infections. Test performance is not FDA approved in patients less than 52 years old. Performed at [facility], [address] [place], [place] [address]   MRSA Next Gen by PCR, Nasal     Status: None   Collection Time: 05/18/23 10:59 AM   Specimen: Nasal Mucosa; Nasal Swab  Result Value Ref Range Status   MRSA by PCR Next Gen NOT DETECTED NOT DETECTED Final    Comment: (NOTE) The GeneXpert MRSA Assay (FDA approved for NASAL specimens only), is one component of a comprehensive MRSA colonization surveillance program. It is not intended to diagnose MRSA infection nor to guide or monitor treatment for MRSA infections. Test performance is not FDA approved in patients less than 76 years old. Performed at [facility], [address], [place], [place] [address]   Culture, blood (Routine X 2) w Reflex to ID Panel     Status: None (Preliminary result)   Collection  Time: 05/18/23  4:56 PM   Specimen: BLOOD  Result Value Ref Range Status   Specimen Description   Final    BLOOD BLOOD RIGHT HAND Performed at [facility], [address], [place], [place] [address]    Special Requests   Final    BOTTLES DRAWN AEROBIC ONLY Blood Culture  results may not be optimal due to an inadequate volume of blood received in culture bottles Performed at [facility], [address] [place], [place] [address]    Culture   Final    NO GROWTH 4 DAYS Performed at [facility], [address], [place], [place] [address]    Report Status PENDING  Incomplete  Culture, blood (Routine X 2) w Reflex to ID Panel     Status: None (Preliminary result)   Collection Time: 05/18/23  5:03 PM   Specimen: BLOOD  Result Value Ref Range Status   Specimen Description   Final    BLOOD BLOOD RIGHT HAND Performed at [facility], [address], [place], [place] [address]    Special Requests   Final    BOTTLES DRAWN AEROBIC ONLY Blood Culture results may not be optimal due to an inadequate volume of blood received in culture bottles Performed at [facility], [address] [place], [place] [address]    Culture   Final    NO GROWTH 4 DAYS Performed at [facility], [address], [place], [place] [address]    Report Status PENDING  Incomplete         Radiology Studies: DG CHEST PORT 1 VIEW Result Date: 05/21/2023 CLINICAL DATA:  [id number] SOB (shortness of breath) [id number] EXAM: PORTABLE CHEST - 1 VIEW COMPARISON:  05/18/2023 FINDINGS: Worsening perihilar and bibasilar interstitial edema or infiltrates. Increasing right pleural effusion. Heart size upper limits normal, stable. Aortic Atherosclerosis (ICD10-170.0). Bilateral shoulder DJD. IMPRESSION: 1. Worsening perihilar and bibasilar interstitial edema or infiltrates. 2. Increasing right pleural effusion. Electronically Signed   By: Corlis Leak  M.D.   On: 05/21/2023 10:36        Scheduled Meds:  apixaban  5 mg Oral BID   aspirin EC  81 mg Oral Daily   atorvastatin  10 mg Oral QHS   Chlorhexidine Gluconate Cloth  6 each Topical Daily   gabapentin  200 mg Oral BID   hydroxychloroquine  200 mg Oral Daily   insulin aspart  0-5 Units Subcutaneous QHS   insulin aspart  0-9 Units Subcutaneous TID WC   insulin glargine  15 Units Subcutaneous QHS   isosorbide mononitrate  30 mg Oral Daily   linaclotide  145 mcg Oral Daily   metoprolol tartrate  50 mg Oral BID   pantoprazole  40 mg Oral Daily   sertraline  75 mg Oral Daily   valproic acid  500 mg Oral TID   Continuous Infusions:  meropenem (MERREM) IV 1 g (05/22/23 0943)     LOS: 7 days    Time spent: 52 minutes spent on 05/22/2023 caring for this patient face-to-face including chart review, ordering labs/tests, documenting, discussion with nursing staff, consultants, updating family and interview/physical exam    Alvira Philips [place], DO [facility] Available via Epic secure chat 7am-7pm After these hours, please refer to coverage provider listed on [email or web address] 05/22/2023, 10:14 AM

## 2023-05-22 NOTE — Progress Notes (Signed)
 MEWS Progress Note  Patient Details Name: Olivia Burnett MRN: [id number] DOB: 1950/04/22 Today's Date: 05/22/2023   MEWS Flowsheet Documentation:  Assess: MEWS Score Temp: 98.7 F (37.1 C) BP: (!) 184/94 MAP (mmHg): 110 Pulse Rate: (!) 103 ECG Heart Rate: (!) 112 Resp: 20 Level of Consciousness: Alert SpO2: 100 % O2 Device: Nasal Cannula Patient Activity (if Appropriate): In bed Heater temperature: 87.8 F (31 C) O2 Flow Rate (L/min): 2 L/min FiO2 (%): 36 % Assess: MEWS Score MEWS Temp: 0 MEWS Systolic: 0 MEWS Pulse: 2 MEWS RR: 0 MEWS LOC: 0 MEWS Score: 2 MEWS Score Color: Yellow Assess: SIRS CRITERIA SIRS Temperature : 0 SIRS Respirations : 0 SIRS Pulse: 1 SIRS WBC: 0 SIRS Score Sum : 1 SIRS Temperature : 0 SIRS Pulse: 1 SIRS Respirations : 0 SIRS WBC: 0 SIRS Score Sum : 1 Assess: if the MEWS score is Yellow or Red Were vital signs accurate and taken at a resting state?: Yes Does the patient meet 2 or more of the SIRS criteria?: No MEWS guidelines implemented : Yes, yellow Treat MEWS Interventions: Considered administering scheduled or prn medications/treatments as ordered Take Vital Signs Increase Vital Sign Frequency : Yellow: Q2hr x1, continue Q4hrs until patient remains green for 12hrs Escalate MEWS: Escalate: Yellow: Discuss with charge nurse and consider notifying provider and/or RRT Notify: Charge Nurse/RN Name of Charge Nurse/RN Notified: Abby, RN Provider Notification Provider Name/Title: Dr. [place], DO Date Provider Notified: 05/22/23 Time Provider Notified: [id number] Method of Notification: Page Notification Reason: Change in status (Still Yellow MEWS - elevated BP) Provider response: See new orders Date of Provider Response: 05/22/23 Time of Provider Response: [id number]      [name] 05/22/2023, 3:11 PM

## 2023-05-22 NOTE — Progress Notes (Addendum)
 PHARMACY - ANTICOAGULATION CONSULT NOTE  Pharmacy Consult for Eliquis Indication: atrial fibrillation  Allergies  Allergen Reactions   Ciprofloxacin     Per MAR   Gemifloxacin     Per MAR   Infliximab Other (See Comments)    Unknown reaction  unknown   Levaquin [Levofloxacin]     Per MAR   Meperidine Other (See Comments)    Unknown reaction  unknown   Mercury     hives   Moxifloxacin     Per MAR   Ofloxacin     Per MAR   Penicillins     rash   Phenobarbital Other (See Comments)    Unknown reaction  unknown   Pregabalin Other (See Comments)    Dry mouth, confusion, incontinence   Prochlorperazine Other (See Comments)    Unknown reaction  unknown    Patient Measurements: Height: 5\' 5"  (165.1 cm) Weight: 72.4 kg (159 lb 9.8 oz) IBW/kg (Calculated) : 57  Vital Signs: Temp: 98.4 F (36.9 C) (03/29 0454) Temp Source: Oral (03/29 0454) BP: 152/96 (03/29 0454) Pulse Rate: 109 (03/28 2006)  Labs: Recent Labs    05/19/23 0824 05/19/23 1409 05/20/23 0012 05/20/23 0012 05/20/23 0819 05/20/23 1835 05/21/23 0407  HGB  --   --  9.4*  --   --   --  9.8*  HCT  --   --  28.6*  --   --   --  31.5*  PLT  --   --  225  --   --   --  191  APTT  --  61* 78*  --   --   --   --   HEPARINUNFRC  --   --  0.67   < > 0.76* 0.28* 0.32  CREATININE  --   --  1.37*  --   --   --  1.07*  TROPONINIHS 514*  --   --   --   --   --   --    < > = values in this interval not displayed.    Estimated Creatinine Clearance: 46.7 mL/min (A) (by C-G formula based on SCr of 1.07 mg/dL (H)).  Assessment: 73 year old female presented with dysuria, productive cough, shortness of breath and chest pain. On 3/25 am, patient was noted to be more lethargic and tachypneic; she was transferred to the stepdown unit. She was found to have new onset afib and started on Eliquis. She received one dose. Later in evening, concern for ACS with elevated troponin and chest pain. Pharmacy consult to manage  heparin infusion.  Pharmacy has now been consulted to transition back to Eliquis.   Plan:  -Stop heparin infusion at 0800 with first dose of Eliquis -Start Eliquis 5 mg BID -Pharmacy to provide education prior to discharge  Pricilla Riffle, PharmD, BCPS Clinical Pharmacist 05/22/2023 6:57 AM

## 2023-05-23 DIAGNOSIS — J189 Pneumonia, unspecified organism: Secondary | ICD-10-CM | POA: Diagnosis not present

## 2023-05-23 LAB — CULTURE, BLOOD (ROUTINE X 2)
Culture: NO GROWTH
Culture: NO GROWTH

## 2023-05-23 LAB — BASIC METABOLIC PANEL WITH GFR
Anion gap: 10 (ref 5–15)
BUN: 17 mg/dL (ref 8–23)
CO2: 28 mmol/L (ref 22–32)
Calcium: 8.9 mg/dL (ref 8.9–10.3)
Chloride: 100 mmol/L (ref 98–111)
Creatinine, Ser: 1.06 mg/dL — ABNORMAL HIGH (ref 0.44–1.00)
GFR, Estimated: 55 mL/min — ABNORMAL LOW (ref >60)
Glucose, Bld: 160 mg/dL — ABNORMAL HIGH (ref 70–99)
Potassium: 4.2 mmol/L (ref 3.5–5.1)
Sodium: 138 mmol/L (ref 135–145)

## 2023-05-23 LAB — CBC
HCT: 34.5 % — ABNORMAL LOW (ref 36.0–46.0)
Hemoglobin: 10.9 g/dL — ABNORMAL LOW (ref 12.0–15.0)
MCH: 30 pg (ref 26.0–34.0)
MCHC: 31.6 g/dL (ref 30.0–36.0)
MCV: 95 fL (ref 80.0–100.0)
Platelets: 259 10*3/uL (ref 150–400)
RBC: 3.63 MIL/uL — ABNORMAL LOW (ref 3.87–5.11)
RDW: 14.5 % (ref 11.5–15.5)
WBC: 8.7 10*3/uL (ref 4.0–10.5)
nRBC: 0.2 % (ref 0.0–0.2)

## 2023-05-23 LAB — GLUCOSE, CAPILLARY
Glucose-Capillary: 152 mg/dL — ABNORMAL HIGH (ref 70–99)
Glucose-Capillary: 191 mg/dL — ABNORMAL HIGH (ref 70–99)
Glucose-Capillary: 165 mg/dL — ABNORMAL HIGH (ref 70–99)
Glucose-Capillary: 188 mg/dL — ABNORMAL HIGH (ref 70–99)

## 2023-05-23 LAB — HEPATIC FUNCTION PANEL
ALT: 13 U/L (ref 0–44)
AST: 17 U/L (ref 15–41)
Albumin: 2.8 g/dL — ABNORMAL LOW (ref 3.5–5.0)
Alkaline Phosphatase: 47 U/L (ref 38–126)
Bilirubin, Direct: <0.1 mg/dL (ref 0.0–0.2)
Total Bilirubin: 0.2 mg/dL (ref 0.0–1.2)
Total Protein: 6.8 g/dL (ref 6.5–8.1)

## 2023-05-23 LAB — VALPROIC ACID LEVEL: Valproic Acid Lvl: 10 ug/mL — ABNORMAL LOW (ref 50.0–100.0)

## 2023-05-23 MED ORDER — FUROSEMIDE 10 MG/ML IJ SOLN
40.0000 mg | Freq: Once | INTRAMUSCULAR | Status: AC
  Administered 2023-05-23: 40 mg via INTRAVENOUS
  Filled 2023-05-23: qty 4

## 2023-05-23 MED ORDER — LIDOCAINE 5 % EX PTCH
1.0000 | MEDICATED_PATCH | CUTANEOUS | Status: AC
  Administered 2023-05-23: 1 via TRANSDERMAL
  Filled 2023-05-23: qty 1

## 2023-05-23 MED ORDER — AMLODIPINE BESYLATE 5 MG PO TABS
10.0000 mg | ORAL_TABLET | Freq: Every day | ORAL | Status: DC
  Administered 2023-05-23 – 2023-05-24 (×2): 10 mg via ORAL
  Filled 2023-05-23 (×2): qty 2

## 2023-05-23 MED ORDER — ZINC OXIDE 40 % EX OINT
TOPICAL_OINTMENT | Freq: Two times a day (BID) | CUTANEOUS | Status: DC | PRN
  Filled 2023-05-23: qty 57

## 2023-05-23 NOTE — Progress Notes (Signed)
 PROGRESS NOTE    Olivia Burnett  ZOX:[id number] DOB: [birth year] DOA: 05/14/2023 PCP: System, Provider Not In    Brief Narrative:   Olivia Burnett is a 73 y.o. female with past medical history significant for HTN, DM2, chronic ITP, CAD, psoriatic arthritis, CKD stage IIIa, CVA with right-sided weakness who presented to [facility] ED on 3/21 from [facility] SNF via EMS nausea, vomiting, generalized bodyaches, fever, of cough, shortness of breath, dysuria, and pleuritic chest pain.  Chest pain worse with deep inspiration, productive cough progressing.  In the ED, temperature 99.4 F, HR 96, RR 16, BP 168/74, SpO2 99% on room air.  WBC 10.9, hemoglobin 11.3, platelet count 117.  Sodium 130, potassium 3.9, chloride 103, CO2 19, BUN 24, creatinine 1.16.  Glucose 353.  High sensitive troponin 9.  Lactic acid 1.2.  Beta-hydroxybutyrate acid 0.07.  COVID/influenza/RSV PCR negative.  Chest x-ray with no active cardiopulmonary disease process.  CT angiogram chest with no evidence of significant pulmonary embolism, mild cardiac enlargement, patchy infiltrates in the lungs greater on the right consistent with multifocal pneumonia.  Patient was started on empiric antibiotics, given antiemetics, analgesics and IV fluids.  TRH consulted for admission.  Assessment & Plan:    Acute hypoxic/hypoxemic respiratory failure Multifocal pneumonia Lying to the ED from SNF with productive cough, shortness of breath, fevers.  Patient with WBC count of 10.9.  CT angiogram chest negative for PE but with patchy infiltrates concerning for multifocal pneumonia.  Completed 5-day course of azithromycin. -- WBC 10.9>>12.5>18.1>12.0>14.6>10.2>9.0>8.7 -- Blood cultures x 2 3/21: No growth x 5 days -- Blood cultures 3/25: No growth x 5 days -- Continue meropenem 1 g IV q12h, plan 7 day course to complete today -- Repeat Lasix 40 IV x 1 this morning  -- Continue to wean supplemental oxygen to maintain SpO2 greater  than 92% -- CBC daily  ESBL Klebsiella pneumonia UTI Urinalysis 3/24 with large leukocytes, negative nitrite, rare bacteria, greater than 50 WBCs.  Was initially started on ceftriaxone for multifocal pneumonia as above, urine culture returned back ESBL Klebsiella pneumonia and patient was changed to meropenem on 3/24 -- Meropenem 1 g IV every 12 hours x 7 days (End 3/30)  NSTEMI Given continued pleuritic chest pain, troponin was reordered on 3/25 notably elevated to 1050 in which he was 9 at time of admission.  Previous NSTEMI in 2019 with PCI/DES to left circumflex.  EKG with atrial fibrillation with rate 116 with no concerning ST elevation/depression or T wave inversion.  Seen by cardiology during hospitalization most likely cough secondary to demand ischemia in the setting of sepsis, A-fib with RVR with known significant CAD history.  Patient was initially maintained on a heparin drip but no plans for further testing per cardiology and now signed off.  Heparin drip transition to Eliquis. -- Outpatient follow-up with cardiology  Paroxysmal atrial fibrillation with RVR CHA2DS2-VASc score = 7.  TTE with LVEF 60 to 65%, no LV regional wall motion abnormalities, mild LVH, LA moderately dilated, noted ascending aortic aneurysm 41 mm, IVC dilated.  Seen by cardiology during hospitalization, now signed off. -- metoprolol tartrate 50mg  PO q12h -- Eliquis -- Continue monitor on telemetry  Ascending aortic aneurysm TTE with noted ascending aortic aneurysm 41 mm.  Continue outpatient surveillance.  Acute metabolic encephalopathy Etiology likely multifactorial in the setting of multifocal pneumonia, ESBL Klebsiella pneumonia UTI. -- Continue as above for UTI/pneumonia -- EEG: With no signs of epileptiform or seizure activity -- Supportive  care  Acute renal failure on CKD stage IIIa Acute metabolic acidosis: Resolved -- Creatinine 1.16>>2.03>1.58>1.37>1.37>1.07>0.88>1.06 -- Avoid nephrotoxins,  renal dose all medications -- BMP daily  Type 2 diabetes mellitus, with hyperglycemia Hemoglobin A1C 8.2% December 2024, not optimally controlled. -- Lantus 15 units subcutaneously daily -- Sensitive SSI for coverage -- CBG before every meal/at bedtime  Essential hypertension -- Metoprolol tartrate 50 mg p.o. twice daily -- Isosorbide mononitrate 30 mg p.o. daily -- Amlodipine 10 mg p.o. daily -- Continue to hold home lisinopril  Chronic ITP -- Platelet count 206, stable  Psoriatic arthritis -- Plaquenil 200 mg p.o. daily  Multivessel CAD  Cardiac catheterization January 2019 with DES to left circumflex, balloon angioplasty to ramus intermedius, CTO of RCA and noted diffuse LAD disease 40-50%. -- Isosorbide mononitrate 30 mg p.o. daily -- Aspirin 81 mg p.o. daily -- Atorvastatin 10 mg p.o. daily  Questionable seizure disorder Tremor, abnormal eye movements Patient reports that she was recently taken off her antiepileptics. -- EEG: With no seizure or epileptiform discharges noted -- Started on Depakote after conversation with neurology by previous hospitalist Dr Sunnie Nielsen -- Outpatient follow-up with neurology  Depression/anxiety -- Sertraline 75 mg p.o. daily  History of CVA with residual right-sided weakness Currently resident at [facility], SN[facility], apparently bedbound given significant decline since December 2024 and that apparently bedbound/wheelchair dependent. -- Continue aspirin, statin -- Supportive care -- Plan to return to SNF at time of discharge with hospice  Goals of care: Patient with significant decline since October/November 2024 now residing in SNF.  Long conversation with multiple family members including son, Trey Paula on 05/20/2023 who wish upon patient's return to [facility]; that should be under hospice care. -- TOC consulted for hospice referral  DVT prophylaxis:  apixaban (ELIQUIS) tablet 5 mg    Code Status: Limited: Do not attempt resuscitation  (DNR) -DNR-LIMITED -Do Not Intubate/DNI  Family Communication: No family present bedside this morning  Disposition Plan:  Level of care: Telemetry Status is: Inpatient Remains inpatient appropriate because: IV antibiotics, anticipate [facility] tomorrow under hospice care    Consultants:  Cardiology: signed off 3/28  Procedures:  TTE:  EEG:   Antimicrobials:  Ceftriaxone 3/21 - 3/23 Azithromycin 3/22 - 3/26 Meropenem 3/24>>   Subjective: Patient seen examined bedside, lying in bed.  No complaints.  Pleasantly confused, no family present at bedside.  To complete IV meropenem today.  Blood pressure improving, will restart amlodipine today.  No specific complaints or concerns at this time.  Denies headache, no abdominal pain, no fever/chills, no nausea/vomiting/diarrhea.  No acute concerns overnight per nursing staff.  Plan for discharge back to SNF under hospice care, likely tomorrow.   Objective: Vitals:   05/22/23 1900 05/22/23 2014 05/23/23 0052 05/23/23 0634  BP: (!) 162/87 (!) 162/87 (!) 151/86 (!) 152/90  Pulse: (!) 123 (!) 121 98 77  Resp:   18 18  Temp: 98.9 F (37.2 C) 98.9 F (37.2 C) 98.5 F (36.9 C) 97.9 F (36.6 C)  TempSrc: Oral Oral Oral Oral  SpO2: 96% 96% 96% 99%  Weight:      Height:        Intake/Output Summary (Last 24 hours) at 05/23/2023 1040 Last data filed at 05/23/2023 0654 Gross per 24 hour  Intake 540 ml  Output 2450 ml  Net -1910 ml   Filed Weights   05/15/23 0010 05/15/23 1100  Weight: 74.8 kg 72.4 kg    Examination:  Physical Exam:  GEN: NAD, alert confused; oriented to person/place, chronically ill appearance, appears older than stated age HEENT: NCAT, PERRL, EOMI, sclera clear, MMM PULM: Breath sounds slight diminished bilateral bases, no wheezing/crackles, normal respiratory effort without accessory muscle use, on 2 L nasal cannula with SpO2 97% at rest. CV: Irregularly irregular rhythm, normal rate w/o  M/G/R GI: abd soft, NTND, NABS, no R/G/M MSK: Moves all extremities independently NEURO: No focal neurological deficits appreciated  Integumentary: No concerning rashes/lesions/wounds noted on exposed skin surface.    Data Reviewed: I have personally reviewed following labs and imaging studies  CBC: Recent Labs  Lab 05/19/23 0515 05/20/23 0012 05/21/23 0407 05/22/23 0609 05/23/23 0631  WBC 12.0* 14.6* 10.2 9.0 8.7  HGB 9.7* 9.4* 9.8* 10.4* 10.9*  HCT 29.5* 28.6* 31.5* 33.4* 34.5*  MCV 92.8 92.3 96.9 95.4 95.0  PLT 206 225 191 258 259   Basic Metabolic Panel: Recent Labs  Lab 05/19/23 0638 05/20/23 0012 05/21/23 0407 05/22/23 0609 05/23/23 0631  NA 131* 130* 135 139 138  K 3.5 3.6 3.4* 3.8 4.2  CL 96* 96* 101 101 100  CO2 23 25 25 27 28   GLUCOSE 117* 181* 169* 165* 160*  BUN 38* 31* 24* 16 17  CREATININE 1.37* 1.37* 1.07* 0.88 1.06*  CALCIUM 8.2* 8.1* 8.1* 8.8* 8.9  MG  --  2.8*  --  2.2  --   PHOS  --  2.5  --   --   --    GFR: Estimated Creatinine Clearance: 47.2 mL/min (A) (by C-G formula based on SCr of 1.06 mg/dL (H)). Liver Function Tests: Recent Labs  Lab 05/23/23 0642  AST 17  ALT 13  ALKPHOS 47  BILITOT 0.2  PROT 6.8  ALBUMIN 2.8*    No results for input(s): "LIPASE", "AMYLASE" in the last 168 hours. No results for input(s): "AMMONIA" in the last 168 hours.  Coagulation Profile: No results for input(s): "INR", "PROTIME" in the last 168 hours.  Cardiac Enzymes: No results for input(s): "CKTOTAL", "CKMB", "CKMBINDEX", "TROPONINI" in the last 168 hours. BNP (last 3 results) No results for input(s): "PROBNP" in the last 8760 hours. HbA1C: No results for input(s): "HGBA1C" in the last 72 hours. CBG: Recent Labs  Lab 05/22/23 0818 05/22/23 1136 05/22/23 1603 05/22/23 2027 05/23/23 0820  GLUCAP 140* 185* 157* 193* 152*   Lipid Profile: No results for input(s): "CHOL", "HDL", "LDLCALC", "TRIG", "CHOLHDL", "LDLDIRECT" in the last 72  hours. Thyroid Function Tests: No results for input(s): "TSH", "T4TOTAL", "FREET4", "T3FREE", "THYROIDAB" in the last 72 hours.  Anemia Panel: No results for input(s): "VITAMINB12", "FOLATE", "FERRITIN", "TIBC", "IRON", "RETICCTPCT" in the last 72 hours. Sepsis Labs: Recent Labs  Lab 05/18/23 1656 05/18/23 1822  LATICACIDVEN 2.9* 1.6    Recent Results (from the past 240 hours)  Blood Culture (routine x 2)     Status: None   Collection Time: 05/14/23  9:22 PM   Specimen: BLOOD  Result Value Ref Range Status   Specimen Description   Final    BLOOD LEFT ANTECUBITAL Performed at [facility], [address], [place], [place] [address]    Special Requests   Final    BOTTLES DRAWN AEROBIC AND ANAEROBIC Blood Culture results may not be optimal due to an inadequate volume of blood received in culture bottles Performed at [facility], [address], [place], [place] [address]    Culture   Final    NO GROWTH 5 DAYS Performed at [facility], [address], [place], [place] [address]    Report Status 05/20/2023 FINAL  Final  Blood Culture (routine x 2)     Status: None   Collection Time: 05/14/23  9:50 PM   Specimen: BLOOD RIGHT ARM  Result Value Ref Range Status   Specimen Description   Final    BLOOD RIGHT ARM Performed at [facility], [address] [place], [place] [address]    Special Requests   Final    BOTTLES DRAWN AEROBIC AND ANAEROBIC Blood Culture results may not be optimal due to an inadequate volume of blood received in culture bottles Performed at [facility], [address], [place], [place] [address]    Culture   Final    NO GROWTH 5 DAYS Performed at [facility], [address] [place], [place] [address]    Report Status 05/20/2023 FINAL  Final  Resp panel by RT-PCR (RSV, Flu A&B, Covid) Anterior Nasal Swab     Status: None   Collection Time: 05/14/23 10:00 PM    Specimen: Anterior Nasal Swab  Result Value Ref Range Status   SARS Coronavirus 2 by RT PCR NEGATIVE NEGATIVE Final    Comment: (NOTE) SARS-CoV-2 target nucleic acids are NOT DETECTED.  The SARS-CoV-2 RNA is generally detectable in upper respiratory specimens during the acute phase of infection. The lowest concentration of SARS-CoV-2 viral copies this assay can detect is 138 copies/mL. A negative result does not preclude SARS-Cov-2 infection and should not be used as the sole basis for treatment or other patient management decisions. A negative result may occur with  improper specimen collection/handling, submission of specimen other than nasopharyngeal swab, presence of viral mutation(s) within the areas targeted by this assay, and inadequate number of viral copies(<138 copies/mL). A negative result must be combined with clinical observations, patient history, and epidemiological information. The expected result is Negative.  Fact Sheet for Patients:  BloggerCourse.com  Fact Sheet for Healthcare Providers:  SeriousBroker.it  This test is no t yet approved or cleared by the [place] FDA and  has been authorized for detection and/or diagnosis of SARS-CoV-2 by FDA under an Emergency Use Authorization (EUA). This EUA will remain  in effect (meaning this test can be used) for the duration of the COVID-19 declaration under Section 564(b)(1) of the Act, 21 U.S.C.section 360bbb-3(b)(1), unless the authorization is terminated  or revoked sooner.       Influenza A by PCR NEGATIVE NEGATIVE Final   Influenza B by PCR NEGATIVE NEGATIVE Final    Comment: (NOTE) The Xpert Xpress SARS-CoV-2/FLU/RSV plus assay is intended as an aid in the diagnosis of influenza from Nasopharyngeal swab specimens and should not be used as a sole basis for treatment. Nasal washings and aspirates are unacceptable for Xpert Xpress  SARS-CoV-2/FLU/RSV testing.  Fact Sheet for Patients: BloggerCourse.com  Fact Sheet for Healthcare Providers: SeriousBroker.it  This test is not yet approved or cleared by the [place] FDA and has been authorized for detection and/or diagnosis of SARS-CoV-2 by FDA under an Emergency Use Authorization (EUA). This EUA will remain in effect (meaning this test can be used) for the duration of the COVID-19 declaration under Section 564(b)(1) of the Act, 21 U.S.C. section 360bbb-3(b)(1), unless the authorization is terminated or revoked.     Resp Syncytial Virus by PCR NEGATIVE NEGATIVE Final    Comment: (NOTE) Fact Sheet for Patients: BloggerCourse.com  Fact Sheet for Healthcare Providers: SeriousBroker.it  This test is  not yet approved or cleared by the [place] and has been authorized for detection and/or diagnosis of SARS-CoV-2 by FDA under an Emergency Use Authorization (EUA). This EUA will remain in effect (meaning this test can be used) for the duration of the COVID-19 declaration under Section 564(b)(1) of the Act, 21 U.S.C. section 360bbb-3(b)(1), unless the authorization is terminated or revoked.  Performed at [facility], [address], [place], [place] [address]   Urine Culture     Status: Abnormal   Collection Time: 05/15/23 12:29 AM   Specimen: Urine, Random  Result Value Ref Range Status   Specimen Description   Final    URINE, RANDOM Performed at [facility], [address], [place], [place] [address]    Special Requests   Final    NONE Reflexed from [phone] Performed at [facility], [address] [place], [place] [address]    Culture (A)  Final    >=100,000 COLONIES/mL KLEBSIELLA PNEUMONIAE Confirmed Extended Spectrum Beta-Lactamase Producer (ESBL).  In bloodstream infections from  ESBL organisms, carbapenems are preferred over piperacillin/tazobactam. They are shown to have a lower risk of mortality.    Report Status 05/17/2023 FINAL  Final   Organism ID, Bacteria KLEBSIELLA PNEUMONIAE (A)  Final      Susceptibility   Klebsiella pneumoniae - MIC*    AMPICILLIN >=32 RESISTANT Resistant     CEFAZOLIN >=64 RESISTANT Resistant     CEFEPIME >=32 RESISTANT Resistant     CEFTRIAXONE >=64 RESISTANT Resistant     CIPROFLOXACIN <=0.25 SENSITIVE Sensitive     GENTAMICIN <=1 SENSITIVE Sensitive     IMIPENEM <=0.25 SENSITIVE Sensitive     NITROFURANTOIN <=16 SENSITIVE Sensitive     TRIMETH/SULFA <=20 SENSITIVE Sensitive     AMPICILLIN/SULBACTAM 16 INTERMEDIATE Intermediate     PIP/TAZO <=4 SENSITIVE Sensitive ug/mL    * >=100,000 COLONIES/mL KLEBSIELLA PNEUMONIAE  MRSA Next Gen by PCR, Nasal     Status: None   Collection Time: 05/15/23  5:40 PM   Specimen: Nasal Mucosa; Nasal Swab  Result Value Ref Range Status   MRSA by PCR Next Gen NOT DETECTED NOT DETECTED Final    Comment: (NOTE) The GeneXpert MRSA Assay (FDA approved for NASAL specimens only), is one component of a comprehensive MRSA colonization surveillance program. It is not intended to diagnose MRSA infection nor to guide or monitor treatment for MRSA infections. Test performance is not FDA approved in patients less than 70 years old. Performed at [facility], [address], [place], [place] [address]   MRSA Next Gen by PCR, Nasal     Status: None   Collection Time: 05/18/23 10:59 AM   Specimen: Nasal Mucosa; Nasal Swab  Result Value Ref Range Status   MRSA by PCR Next Gen NOT DETECTED NOT DETECTED Final    Comment: (NOTE) The GeneXpert MRSA Assay (FDA approved for NASAL specimens only), is one component of a comprehensive MRSA colonization surveillance program. It is not intended to diagnose MRSA infection nor to guide or monitor treatment for MRSA infections. Test performance is not  FDA approved in patients less than 83 years old. Performed at [facility], [address] [place], [place] [address]   Culture, blood (Routine X 2) w Reflex to ID Panel     Status: None   Collection Time: 05/18/23  4:56 PM   Specimen: BLOOD  Result Value Ref Range Status   Specimen Description  Final    BLOOD BLOOD RIGHT HAND Performed at [facility], [address] [place], [place] [address]    Special Requests   Final    BOTTLES DRAWN AEROBIC ONLY Blood Culture results may not be optimal due to an inadequate volume of blood received in culture bottles Performed at [facility], [address] [place], [place] [address]    Culture   Final    NO GROWTH 5 DAYS Performed at [facility], [address] [place], [place] [address]    Report Status 05/23/2023 FINAL  Final  Culture, blood (Routine X 2) w Reflex to ID Panel     Status: None   Collection Time: 05/18/23  5:03 PM   Specimen: BLOOD  Result Value Ref Range Status   Specimen Description   Final    BLOOD BLOOD RIGHT HAND Performed at [facility], [address], [place], [place] [address]    Special Requests   Final    BOTTLES DRAWN AEROBIC ONLY Blood Culture results may not be optimal due to an inadequate volume of blood received in culture bottles Performed at [facility], [address], [place], [place] [address]    Culture   Final    NO GROWTH 5 DAYS Performed at [facility], [address] [place], [place] [address]    Report Status 05/23/2023 FINAL  Final         Radiology Studies: No results found.       Scheduled Meds:  amLODipine  10 mg Oral Daily   apixaban  5 mg Oral BID   aspirin EC  81 mg Oral Daily   atorvastatin  10 mg Oral QHS   Chlorhexidine Gluconate Cloth  6 each Topical Daily   gabapentin  200 mg Oral BID   hydroxychloroquine  200 mg Oral Daily   insulin aspart  0-5  Units Subcutaneous QHS   insulin aspart  0-9 Units Subcutaneous TID WC   insulin glargine  15 Units Subcutaneous QHS   isosorbide mononitrate  30 mg Oral Daily   linaclotide  145 mcg Oral Daily   metoprolol tartrate  50 mg Oral BID   pantoprazole  40 mg Oral Daily   sertraline  75 mg Oral Daily   valproic acid  500 mg Oral TID   Continuous Infusions:  meropenem (MERREM) IV 1 g (05/23/23 0115)     LOS: 8 days    Time spent: 48 minutes spent on 05/23/2023 caring for this patient face-to-face including chart review, ordering labs/tests, documenting, discussion with nursing staff, consultants, updating family and interview/physical exam    Alvira Philips [place], DO [facility] Available via Epic secure chat 7am-7pm After these hours, please refer to coverage provider listed on [email or web address] 05/23/2023, 10:40 AM

## 2023-05-23 NOTE — Plan of Care (Signed)
 Patient did well today.  Expected to discharge back to [facility] Monday.

## 2023-05-24 DIAGNOSIS — J189 Pneumonia, unspecified organism: Secondary | ICD-10-CM | POA: Diagnosis not present

## 2023-05-24 LAB — GLUCOSE, CAPILLARY
Glucose-Capillary: 198 mg/dL — ABNORMAL HIGH (ref 70–99)
Glucose-Capillary: 188 mg/dL — ABNORMAL HIGH (ref 70–99)

## 2023-05-24 MED ORDER — METOPROLOL TARTRATE 50 MG PO TABS: 50.0000 mg | ORAL_TABLET | Freq: Two times a day (BID) | ORAL | 0 refills | Status: AC

## 2023-05-24 MED ORDER — PANTOPRAZOLE SODIUM 40 MG PO TBEC: 40.0000 mg | DELAYED_RELEASE_TABLET | Freq: Every day | ORAL | 0 refills | Status: AC

## 2023-05-24 MED ORDER — FUROSEMIDE 10 MG/ML IJ SOLN
40.0000 mg | Freq: Once | INTRAMUSCULAR | Status: AC
Start: 2023-05-24 — End: 2023-05-24
  Administered 2023-05-24: 40 mg via INTRAVENOUS
  Filled 2023-05-24: qty 4

## 2023-05-24 MED ORDER — VALPROIC ACID 250 MG PO CAPS: 500.0000 mg | ORAL_CAPSULE | Freq: Three times a day (TID) | ORAL | 0 refills | Status: AC

## 2023-05-24 MED ORDER — APIXABAN 5 MG PO TABS: 5.0000 mg | ORAL_TABLET | Freq: Two times a day (BID) | ORAL | 0 refills | Status: AC

## 2023-05-24 NOTE — TOC Transition Note (Signed)
 Transition of Care [facility]) - Discharge Note   Patient Details  Name: Olivia Burnett MRN: [id number] Date of Birth: October 27, 1950  Transition of Care [facility]) CM/SW Contact:  Lanier Clam, RN Phone Number: 05/24/2023, 1:00 PM   Clinical Narrative: d/c back [facility] rep [place] aware. Receiving home hospice through [facility]. [facility] for transport going to [facility] rm#[id number], report #[phone].PT[facility] called.No further CM needs.      Final next level of care: Long Term Nursing Home Barriers to Discharge: No Barriers Identified   Patient Goals and CMS Choice Patient states their goals for this hospitalization and ongoing recovery are:: Return [name] CMS Medicare.gov Compare Post Acute Care list provided to:: Patient Represenative (must comment) Choice offered to / list presented to : Adult Children [place] ownership interest in [facility].provided to:: Adult Children    Discharge Placement              [facility] Patient to be transferred to facility by: [facility] Name of family member notified: [name] Patient and family notified of of transfer: 05/24/23  Discharge Plan and Services Additional resources added to the After Visit Summary for   In-house Referral: Clinical Social Work   Post Acute Care Choice: Hospice, Nursing Home          DME Arranged: N/A DME Agency: NA                  Social Drivers of Health (SDOH) Interventions SDOH Screenings   Food Insecurity: No Food Insecurity (02/09/2023)  Housing: Low Risk  (02/09/2023)  Transportation Needs: No Transportation Needs (02/09/2023)  Utilities: Not At Risk (02/09/2023)  Tobacco Use: Unknown (05/15/2023)     Readmission Risk Interventions    05/21/2023   10:23 AM  Readmission Risk Prevention Plan  Transportation Screening Complete  HRI or Home Care Consult Complete  Social Work Consult for Recovery Care Planning/Counseling Complete  Palliative  Care Screening Not Applicable  Medication Review Oceanographer) Complete

## 2023-05-24 NOTE — Discharge Summary (Signed)
 Physician Discharge Summary  Olivia Burnett ZOX:[id number] DOB: 1950-08-21 DOA: 05/14/2023  PCP: System, Provider Not In  Admit date: 05/14/2023 Discharge date: 05/24/2023  Admitted From: [facility] Disposition: [facility]F with hospice  Recommendations for Outpatient Follow-up:  Follow up with PCP in 1-2 weeks Follow-up with Olivia Callander, MD; cardiology in 3 weeks [facility] with [facility] as needed for recurrent urinary tract infections Please obtain BMP/CBC in one week  Discharge Condition: Stable CODE STATUS: DNR Diet recommendation: Heart healthy diet  History of present illness:  Olivia Burnett is a 73 y.o. female with past medical history significant for HTN, DM2, chronic ITP, CAD, psoriatic arthritis, CKD stage IIIa, CVA with right-sided weakness who presented to [facility] ED on 3/21 from [facility] via EMS nausea, vomiting, generalized bodyaches, fever, of cough, shortness of breath, dysuria, and pleuritic chest pain.  Chest pain worse with deep inspiration, productive cough progressing.   In the ED, temperature 99.4 F, HR 96, RR 16, BP 168/74, SpO2 99% on room air.  WBC 10.9, hemoglobin 11.3, platelet count 117.  Sodium 130, potassium 3.9, chloride 103, CO2 19, BUN 24, creatinine 1.16.  Glucose 353.  High sensitive troponin 9.  Lactic acid 1.2.  Beta-hydroxybutyrate acid 0.07.  COVID/influenza/RSV PCR negative.  Chest x-ray with no active cardiopulmonary disease process.  CT angiogram chest with no evidence of significant pulmonary embolism, mild cardiac enlargement, patchy infiltrates in the lungs greater on the right consistent with multifocal pneumonia.  Patient was started on empiric antibiotics, given antiemetics, analgesics and IV fluids.  TRH consulted for admission.  Hospital course:  Acute hypoxic/hypoxemic respiratory failure Multifocal pneumonia Lying to the ED from SNF with productive cough, shortness of  breath, fevers.  Patient with WBC count of 10.9.  CT angiogram chest negative for PE but with patchy infiltrates concerning for multifocal pneumonia.  Blood cultures x 2 on 05/14/2023 and 05/18/2023 with no growth x 5 days.  Completed 5-day course of azithromycin and 7-day course of IV meropenem.  WBC count peaked at 18.1 during hospitalization and improved to 8.7 at time of discharge.  Recommend repeat CBC 1 week.   ESBL Klebsiella pneumonia UTI Urinalysis 3/24 with large leukocytes, negative nitrite, rare bacteria, greater than 50 WBCs.  Was initially started on ceftriaxone for multifocal pneumonia as above, urine culture returned back ESBL Klebsiella pneumonia and patient was changed to meropenem on 3/24; and completed 7-day course on 05/23/2023.  Recommend outpatient follow-up with urology/infectious disease as needed for recurrent UTI.   NSTEMI Given continued pleuritic chest pain, troponin was reordered on 3/25 notably elevated to 1050 in which he was 9 at time of admission.  Previous NSTEMI in 2019 with PCI/DES to left circumflex.  EKG with atrial fibrillation with rate 116 with no concerning ST elevation/depression or T wave inversion.  Seen by cardiology during hospitalization most likely cough secondary to demand ischemia in the setting of sepsis, A-fib with RVR with known significant CAD history.  Patient was initially maintained on a heparin drip but no plans for further testing per cardiology and now signed off.  Heparin drip transition to Eliquis. Outpatient follow-up with cardiology, Olivia. Kingsley Burnett.   Paroxysmal atrial fibrillation with RVR CHA2DS2-VASc score = 7.  TTE with LVEF 60 to 65%, no LV regional wall motion abnormalities, mild LVH, LA moderately dilated, noted ascending aortic aneurysm 41 mm, IVC dilated.  Seen by cardiology during hospitalization, now signed off. metoprolol tartrate increased to 50mg  PO  q12h, started on Eliquis.  Outpatient follow-up with cardiology.   Ascending  aortic aneurysm TTE with noted ascending aortic aneurysm 41 mm.  Continue outpatient surveillance.   Acute metabolic encephalopathy Etiology likely multifactorial in the setting of multifocal pneumonia, ESBL Klebsiella pneumonia UTI.  EEG with no signs of epileptiform or seizure activity.  Now resolved and mentation at baseline.   Acute renal failure on CKD stage IIIa Acute metabolic acidosis: Resolved Creatinine peaked at 2.03 during hospitalization and improved to 1.06 at time of discharge.  Discontinued home lisinopril.  Recommend repeat BMP 1 week.   Type 2 diabetes mellitus, with hyperglycemia Hemoglobin A1C 8.2% December 2024, not optimally controlled.  Continue Lantus 28 subperiosteally daily.   Essential hypertension Metoprolol tartrate 50 mg p.o. twice daily, Isosorbide mononitrate 30 mg p.o. daily, Amlodipine 10 mg p.o. daily; lisinopril discontinued due to renal insufficiency/AKI during hospitalization.  Continue to monitor BP closely following discharge.   Chronic ITP Platelet count 206, stable   Psoriatic arthritis Plaquenil 200 mg p.o. daily   Multivessel CAD  Cardiac catheterization January 2019 with DES to left circumflex, balloon angioplasty to ramus intermedius, CTO of RCA and noted diffuse LAD disease 40-50%. Isosorbide mononitrate 30 mg p.o. daily, Aspirin 81 mg p.o. daily, atorvastatin 10 mg p.o. daily; new started on Eliquis as above.   Questionable seizure disorder Tremor, abnormal eye movements Patient reports that she was recently taken off her antiepileptics. EEG: With no seizure or epileptiform discharges noted. Started on Depakote after conversation with neurology by previous hospitalist [facility]. Outpatient follow-up with neurology   Depression/anxiety Sertraline 75 mg p.o. daily   History of CVA with residual right-sided weakness Currently resident at [facility], SNF, apparently bedbound given significant decline since December 2024 and that  apparently bedbound/wheelchair dependent.  Continue aspirin and statin, new started on Eliquis as above.   Goals of care: Patient with significant decline since October/November 2024 now residing in SNF.  Long conversation with multiple family members including son, Trey Paula on 05/20/2023 who wish upon patient's return to [facility]; that should be under hospice care.  TOC consulted and will discharge with home hospice.   Discharge Diagnoses:  Principal Problem:   Multifocal pneumonia Active Problems:   CKD stage 3a, GFR 45-59 ml/min (HCC)   UTI (urinary tract infection)   History of stroke   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)   Essential hypertension   Seizures (HCC)   Thrombocytopenia (HCC)   CAD S/P percutaneous coronary angioplasty   Chronic ITP (idiopathic thrombocytopenia) (HCC)   Psoriatic arthritis (HCC)    Discharge Instructions  Discharge Instructions     Call MD for:  difficulty breathing, headache or visual disturbances   Complete by: As directed    Call MD for:  extreme fatigue   Complete by: As directed    Call MD for:  persistant dizziness or light-headedness   Complete by: As directed    Call MD for:  persistant nausea and vomiting   Complete by: As directed    Call MD for:  severe uncontrolled pain   Complete by: As directed    Call MD for:  temperature >100.4   Complete by: As directed    Diet - low sodium heart healthy   Complete by: As directed    Increase activity slowly   Complete by: As directed       Allergies as of 05/24/2023       Reactions  Ciprofloxacin    Per MAR   Gemifloxacin    Per MAR   Infliximab Other (See Comments)   Unknown reaction unknown   Levaquin [levofloxacin]    Per MAR   Meperidine Other (See Comments)   Unknown reaction unknown   Mercury    hives   Moxifloxacin    Per MAR   Ofloxacin    Per MAR   Penicillins    rash   Phenobarbital Other (See Comments)   Unknown  reaction unknown   Pregabalin Other (See Comments)   Dry mouth, confusion, incontinence   Prochlorperazine Other (See Comments)   Unknown reaction unknown        Medication List     STOP taking these medications    fosfomycin 3 g Pack Commonly known as: MONUROL   lisinopril 10 MG tablet Commonly known as: ZESTRIL   sodium chloride 1 g tablet       TAKE these medications    acetaminophen 500 MG tablet Commonly known as: TYLENOL Take 1,000 mg by mouth in the morning, at noon, and at bedtime.   acetaminophen 325 MG tablet Commonly known as: TYLENOL Take 650 mg by mouth every 4 (four) hours as needed for fever.   amLODipine 10 MG tablet Commonly known as: NORVASC Take 10 mg by mouth daily.   apixaban 5 MG Tabs tablet Commonly known as: ELIQUIS Take 1 tablet (5 mg total) by mouth 2 (two) times daily.   ARTIFICIAL TEARS OP Place 1 drop into both eyes 4 (four) times daily.   aspirin EC 81 MG tablet Take 81 mg by mouth daily. Swallow whole.   atorvastatin 10 MG tablet Commonly known as: LIPITOR Take 5 mg by mouth at bedtime.   Biofreeze Cool The Pain 4 % Gel Generic drug: Menthol (Topical Analgesic) Apply 1 Application topically as needed (Moderate pain).   calcium carbonate 600 MG Tabs tablet Commonly known as: OS-CAL Take 600 mg by mouth every morning.   Calmoseptine 0.44-20.6 % Oint Generic drug: Menthol-Zinc Oxide Apply 1 Application topically in the morning and at bedtime.   chlorhexidine 0.12 % solution Commonly known as: PERIDEX Take 15 mLs by mouth 2 (two) times daily.   Cranberry 450 MG Tabs Take 450 mg by mouth daily.   gabapentin 100 MG capsule Commonly known as: NEURONTIN Take 200 mg by mouth 2 (two) times daily.   guaiFENesin 100 MG/5ML liquid Commonly known as: ROBITUSSIN Take 15 mLs by mouth every 4 (four) hours as needed for cough or to loosen phlegm.   hydroxychloroquine 200 MG tablet Commonly known as: PLAQUENIL Take 200 mg  by mouth daily.   Imodium A-D 2 MG tablet Generic drug: loperamide Take 2 mg by mouth 4 (four) times daily as needed for diarrhea or loose stools.   insulin glargine 100 UNIT/ML injection Commonly known as: LANTUS Inject 0.2 mLs (20 Units total) into the skin daily.   isosorbide mononitrate 30 MG 24 hr tablet Commonly known as: IMDUR Take 30 mg by mouth daily.   LACTOBACILLUS PO Take 1 capsule by mouth daily.   Linzess 145 MCG Caps capsule Generic drug: linaclotide Take 145 mcg by mouth daily.   melatonin 3 MG Tabs tablet Take 9 mg by mouth at bedtime.   metoprolol tartrate 50 MG tablet Commonly known as: LOPRESSOR Take 1 tablet (50 mg total) by mouth 2 (two) times daily. What changed:  medication strength how much to take   nystatin cream Commonly known as: MYCOSTATIN Apply 1  Application topically 2 (two) times daily as needed (rash).   pantoprazole 40 MG tablet Commonly known as: PROTONIX Take 1 tablet (40 mg total) by mouth daily. Start taking on: May 25, 2023   polyethylene glycol 17 g packet Commonly known as: MIRALAX / GLYCOLAX Take 17 g by mouth daily.   promethazine 25 MG tablet Commonly known as: PHENERGAN Take 25 mg by mouth every 6 (six) hours as needed for nausea or vomiting.   selenium sulfide 1 % Lotn Commonly known as: SELSUN Apply 1 Application topically daily. On Wednesday and Friday   sennosides-docusate sodium 8.6-50 MG tablet Commonly known as: SENOKOT-S Take 2 tablets by mouth in the morning and at bedtime.   sertraline 50 MG tablet Commonly known as: ZOLOFT Take 75 mg by mouth daily.   simethicone 125 MG chewable tablet Commonly known as: MYLICON Chew 125 mg by mouth at bedtime as needed for flatulence.   triamcinolone 0.1 % paste Commonly known as: KENALOG Use as directed 1 Application in the mouth or throat at bedtime as needed (cold sores).   valproic acid 250 MG capsule Commonly known as: DEPAKENE Take 2 capsules (500 mg  total) by mouth 3 (three) times daily.        Contact information for follow-up providers     [facility] Follow up.   Specialty: Hospice and Palliative Medicine Why: [facility] Contact information: [address] [place] [address] [phone]        [facility] Follow up.   Why: As needed for recurrent UTI Contact information: [address] [place] [address] [place] [address] [phone]        [facility]              Follow up.   Why: As needed for recurrent UTI Contact information: [address] [place] [address]             Contact information for after-discharge care     Destination     [facility] .   Service: Skilled Nursing Contact information: [address] [place] [address] [phone]                    Allergies  Allergen Reactions   Ciprofloxacin     Per [facility]   Gemifloxacin     Per [facility]   Infliximab Other (See Comments)    Unknown reaction  unknown   Levaquin [Levofloxacin]     Per MAR   Meperidine Other (See Comments)    Unknown reaction  unknown   Mercury     hives   Moxifloxacin     Per MAR   Ofloxacin     Per MAR   Penicillins     rash   Phenobarbital Other (See Comments)    Unknown reaction  unknown   Pregabalin Other (See Comments)    Dry mouth, confusion, incontinence   Prochlorperazine Other (See Comments)    Unknown reaction  unknown    Consultations: Cardiology   Procedures/Studies: DG CHEST PORT 1 VIEW Result Date: 05/21/2023 CLINICAL DATA:  141880 SOB (shortness of breath) [id number] EXAM: PORTABLE CHEST - 1 VIEW COMPARISON:  05/18/2023 FINDINGS: Worsening perihilar and bibasilar interstitial edema or infiltrates. Increasing right pleural effusion. Heart size upper limits normal, stable. Aortic Atherosclerosis (ICD10-170.0). Bilateral shoulder DJD. IMPRESSION: 1.  Worsening perihilar and bibasilar interstitial edema or infiltrates. 2. Increasing right pleural effusion. Electronically Signed  By: Corlis Leak M.D.   On: 05/21/2023 10:36   EEG adult Result Date: 05/19/2023 Charlsie Quest, MD     05/19/2023  8:35 PM Patient Name: DARA BEIDLEMAN MRN: [id number] Epilepsy Attending: Charlsie Quest Referring Physician/Provider: Alba Cory, MD Date: 05/19/2023 Duration: 24.22 mins Patient history: 72yo F with ams. EEG to evaluate for seizure Level of alertness: Awake AEDs during EEG study: GBP, VPA Technical aspects: This EEG study was done with scalp electrodes positioned according to the 10-20 International system of electrode placement. Electrical activity was reviewed with band pass filter of 1-70Hz , sensitivity of 7 uV/mm, display speed of 64mm/sec with a 60Hz  notched filter applied as appropriate. EEG data were recorded continuously and digitally stored.  Video monitoring was available and reviewed as appropriate. Description: The posterior dominant rhythm consists of 7.5 Hz activity of moderate voltage (25-35 uV) seen predominantly in posterior head regions, symmetric and reactive to eye opening and eye closing. EEG showed intermittent generalized 3 to 6 Hz theta-delta slowing. Hyperventilation and photic stimulation were not performed.    ABNORMALITY - Intermittent slow, generalized  IMPRESSION: This study is suggestive of mild diffuse encephalopathy. No seizures or epileptiform discharges were seen throughout the recording.  Charlsie Quest   ECHOCARDIOGRAM COMPLETE Result Date: 05/19/2023    ECHOCARDIOGRAM REPORT   Patient Name:   PIERA DOWNS Date of Exam: 05/19/2023 Medical Rec #:  [id number]         Height:       65.0 in Accession #:    [id number]        Weight:       159.6 lb Date of Birth:  29-Sep-1950         BSA:          1.797 m Patient Age:    72 years          BP:           113/81 mmHg Patient Gender: F                 HR:           88 bpm.  Exam Location:  Inpatient Procedure: 2D Echo, Cardiac Doppler, Color Doppler and Intracardiac            Opacification Agent (Both Spectral and Color Flow Doppler were            utilized during procedure). Indications:    Chest Pain  History:        Patient has prior history of Echocardiogram examinations. CAD,                 Stroke; Risk Factors:Diabetes and Hypertension.  Sonographer:    Lamont Snowball Referring Phys: [id number] Jamilya Sarrazin J [place]  Sonographer Comments: Patient is obese. Image acquisition challenging due to respiratory motion. IMPRESSIONS  1. Left ventricular ejection fraction, by estimation, is 60 to 65%. The left ventricle has normal function. The left ventricle has no regional wall motion abnormalities. There is mild left ventricular hypertrophy. Left ventricular diastolic parameters are indeterminate.  2. Right ventricular systolic function is normal. The right ventricular size is normal.  3. Left atrial size was moderately dilated.  4. The mitral valve is normal in structure. No evidence of mitral valve regurgitation. No evidence of mitral stenosis.  5. The aortic valve is normal in structure. Aortic valve regurgitation is not visualized. No aortic stenosis is present.  6. Aortic dilatation noted. There is mild dilatation of  the ascending aorta, measuring 41 mm.  7. The inferior vena cava is dilated in size with <50% respiratory variability, suggesting right atrial pressure of 15 mmHg. FINDINGS  Left Ventricle: Left ventricular ejection fraction, by estimation, is 60 to 65%. The left ventricle has normal function. The left ventricle has no regional wall motion abnormalities. The left ventricular internal cavity size was normal in size. There is  mild left ventricular hypertrophy. Left ventricular diastolic parameters are indeterminate. Right Ventricle: The right ventricular size is normal. No increase in right ventricular wall thickness. Right ventricular systolic function is normal. Left Atrium:  Left atrial size was moderately dilated. Right Atrium: Right atrial size was normal in size. Pericardium: There is no evidence of pericardial effusion. Mitral Valve: The mitral valve is normal in structure. No evidence of mitral valve regurgitation. No evidence of mitral valve stenosis. MV peak gradient, 6.9 mmHg. The mean mitral valve gradient is 3.0 mmHg. Tricuspid Valve: The tricuspid valve is normal in structure. Tricuspid valve regurgitation is not demonstrated. No evidence of tricuspid stenosis. Aortic Valve: The aortic valve is normal in structure. Aortic valve regurgitation is not visualized. No aortic stenosis is present. Aortic valve peak gradient measures 6.5 mmHg. Pulmonic Valve: The pulmonic valve was normal in structure. Pulmonic valve regurgitation is trivial. No evidence of pulmonic stenosis. Aorta: Aortic dilatation noted. There is mild dilatation of the ascending aorta, measuring 41 mm. Venous: The inferior vena cava is dilated in size with less than 50% respiratory variability, suggesting right atrial pressure of 15 mmHg. IAS/Shunts: No atrial level shunt detected by color flow Doppler.  LEFT VENTRICLE PLAX 2D LVIDd:         4.60 cm LVIDs:         3.00 cm LV PW:         1.10 cm LV IVS:        1.20 cm LVOT diam:     2.00 cm LV SV:         34 LV SV Index:   19 LVOT Area:     3.14 cm  RIGHT VENTRICLE          IVC RV Basal diam:  3.00 cm  IVC diam: 2.50 cm LEFT ATRIUM             Index        RIGHT ATRIUM           Index LA diam:        4.10 cm 2.28 cm/m   RA Area:     13.10 cm LA Vol (A2C):   85.7 ml 47.68 ml/m  RA Volume:   28.30 ml  15.75 ml/m LA Vol (A4C):   72.6 ml 40.39 ml/m LA Biplane Vol: 80.0 ml 44.51 ml/m  AORTIC VALVE AV Area (Vmax): 1.72 cm AV Vmax:        127.67 cm/s AV Peak Grad:   6.5 mmHg LVOT Vmax:      69.97 cm/s LVOT Vmean:     48.133 cm/s LVOT VTI:       0.109 m  AORTA Ao Root diam: 2.90 cm Ao Asc diam:  4.10 cm MITRAL VALVE MV Area (PHT): 4.89 cm     SHUNTS MV Area VTI:    1.58 cm     Systemic VTI:  0.11 m MV Peak grad:  6.9 mmHg     Systemic Diam: 2.00 cm MV Mean grad:  3.0 mmHg MV Vmax:       1.32 m/s MV Vmean:  80.6 cm/s MV E velocity: 113.00 cm/s Donato Schultz MD Electronically signed by Donato Schultz MD Signature Date/Time: 05/19/2023/4:11:30 PM    Final    DG CHEST PORT 1 VIEW Result Date: 05/18/2023 CLINICAL DATA:  Hypoxia. EXAM: PORTABLE CHEST 1 VIEW COMPARISON:  Chest radiograph dated 05/14/2023. FINDINGS: Small left pleural effusion and left lung base atelectasis. Pneumonia is not excluded. The right lung is clear. No pneumothorax. Stable cardiomegaly. No acute osseous pathology. IMPRESSION: Small left pleural effusion and left lung base atelectasis. Electronically Signed   By: Elgie Collard M.D.   On: 05/18/2023 11:46   US RENAL Result Date: 05/17/2023 CLINICAL DATA:  [id number] AKI (acute kidney injury) (HCC) [id number] EXAM: RENAL / URINARY TRACT ULTRASOUND COMPLETE COMPARISON:  None Available. FINDINGS: Right Kidney: Renal measurements: 10.4 x 5.5 x 4.4 cm = volume: 131.5 mL. Echogenicity increased. No mass or hydronephrosis visualized. Left Kidney: Renal measurements: 10.5 x 5.1 x 4 cm = volume: 111.8 mL. Echogenicity increased. No mass or hydronephrosis visualized. Urinary bladder: Appears normal for degree of bladder distention. Other: None. IMPRESSION: Increased parenchymal echogenicity suggestive of renal parenchymal disease. Electronically Signed   By: Tish Frederickson M.D.   On: 05/17/2023 17:28   CT HEAD WO CONTRAST ( ) Result Date: 05/16/2023 CLINICAL DATA:  Headache.  Increasing frequency or severity. EXAM: CT HEAD WITHOUT CONTRAST TECHNIQUE: Contiguous axial images were obtained from the base of the skull through the vertex without intravenous contrast. RADIATION DOSE REDUCTION: This exam was performed according to the departmental dose-optimization program which includes automated exposure control, adjustment of the mA and/or kV according to patient size  and/or use of iterative reconstruction technique. COMPARISON:  MR head 02/09/2023. FINDINGS: Brain: Advanced atrophy and white matter disease is again noted. Remote lacunar infarcts of the basal ganglia and thalami bilaterally are stable. Extensive periventricular and subcortical white matter disease is stable bilaterally. Remote lacunar infarcts of the left corona radiata are stable. A remote infarct is present in the posteromedial left occipital lobe. The ventricles are proportionate to the degree of atrophy. No significant extraaxial fluid collection is present. Remote lacunar infarcts of the cerebellum bilaterally are stable. No acute infarcts are present. Remote infarcts are present in the mid brain. Vascular: Extensive atherosclerotic changes are present within the cavernous internal carotid arteries bilaterally and at the dural margin of the left vertebral artery. No hyperdense vessel is present. Skull: Calvarium is intact. No focal lytic or blastic lesions are present. No significant extracranial soft tissue lesion is present. Sinuses/Orbits: The paranasal sinuses and mastoid air cells are clear. Bilateral lens replacements are noted. Globes and orbits are otherwise unremarkable. IMPRESSION: 1. No acute intracranial abnormality or significant interval change. 2. Advanced atrophy and white matter disease likely reflects the sequela of chronic microvascular ischemia. 3. Multiple remote infarcts as described are stable. Electronically Signed   By: Marin Roberts M.D.   On: 05/16/2023 20:01   CT Angio Chest PE W and/or Wo Contrast Result Date: 05/15/2023 CLINICAL DATA:  Pulmonary embolus suspected with high probability. EXAM: CT ANGIOGRAPHY CHEST WITH CONTRAST TECHNIQUE: Multidetector CT imaging of the chest was performed using the standard protocol during bolus administration of intravenous contrast. Multiplanar CT image reconstructions and MIPs were obtained to evaluate the vascular anatomy. RADIATION  DOSE REDUCTION: This exam was performed according to the departmental dose-optimization program which includes automated exposure control, adjustment of the mA and/or kV according to patient size and/or use of iterative reconstruction technique. CONTRAST:  75mL OMNIPAQUE IOHEXOL 350 MG/ML SOLN COMPARISON:  Chest radiograph 05/14/2023.  CT chest 10/08/2017 FINDINGS: Cardiovascular: Technically adequate study with good opacification of the central and segmental pulmonary arteries. Minimal motion artifact. No focal filling defects are identified. No evidence of significant pulmonary embolus. Cardiac enlargement with prominence of the left atrium. No pericardial effusion. Normal caliber thoracic aorta. No dissection. Mediastinum/Nodes: Esophagus is decompressed. Scattered lymph nodes are not pathologically enlarged, likely reactive. Thyroid gland is unremarkable. Lungs/Pleura: Motion artifact limits evaluation of the lungs. There appears to be patchy infiltration in the lung bases and throughout the right lung. This could represent developing multifocal pneumonia. Slight fibrosis is suggested in the lung bases. No pleural effusion or pneumothorax. Upper Abdomen: Hepatic cirrhosis with enlarged lateral segment left lobe and nodular contour. Splenic enlargement. Musculoskeletal: No chest wall abnormality. No acute or significant osseous findings. Review of the MIP images confirms the above findings. IMPRESSION: 1. No evidence of significant pulmonary embolus. 2. Mild cardiac enlargement. 3. Patchy infiltrates in the lungs, greater on the right. This is nonspecific but may represent early multifocal pneumonia. 4. Hepatic cirrhosis with splenic enlargement. Electronically Signed   By: Burman Nieves M.D.   On: 05/15/2023 00:07   DG Chest Port 1 View Result Date: 05/14/2023 CLINICAL DATA:  Possible sepsis EXAM: PORTABLE CHEST 1 VIEW COMPARISON:  03/23/2022 FINDINGS: The heart size and mediastinal contours are within  normal limits. Both lungs are clear. The visualized skeletal structures are unremarkable. IMPRESSION: No active disease. Electronically Signed   By: Alcide Clever M.D.   On: 05/14/2023 21:33     Subjective: Patient seen examined bedside, lying in bed.  No complaints, pleasantly confused.  Completed IV antibiotics yesterday evening.  Discharging back to SNF under hospice care today.  Denies headache, no abdominal pain, no fever/chills, no nausea/vomiting/diarrhea, no shortness of breath, no chest pain.  No acute events overnight per nurse staff.  Discharge Exam: Vitals:   05/24/23 0900 05/24/23 0915  BP:  (!) 159/118  Pulse:  (!) 125  Resp: (!) 26 19  Temp:  98.1 F (36.7 C)  SpO2:  98%   Vitals:   05/24/23 0700 05/24/23 0800 05/24/23 0900 05/24/23 0915  BP:    (!) 159/118  Pulse:    (!) 125  Resp: 18 15 (!) 26 19  Temp:    98.1 F (36.7 C)  TempSrc:    Oral  SpO2:    98%  Weight:      Height:        Physical Exam: GEN: NAD, alert confused; oriented to person/place, chronically ill appearance, appears older than stated age HEENT: NCAT, PERRL, EOMI, sclera clear, MMM PULM: Breath sounds slight diminished bilateral bases, no wheezing/crackles, normal respiratory effort without accessory muscle use, on room air. CV: Irregularly irregular rhythm, normal rate w/o M/G/R GI: abd soft, NTND, NABS, no R/G/M MSK: Moves all extremities independently NEURO: No focal neurological deficits appreciated  Integumentary: No concerning rashes/lesions/wounds noted on exposed skin surface.    The results of significant diagnostics from this hospitalization (including imaging, microbiology, ancillary and laboratory) are listed below for reference.     Microbiology: Recent Results (from the past 240 hours)  Blood Culture (routine x 2)     Status: None   Collection Time: 05/14/23  9:22 PM   Specimen: BLOOD  Result Value Ref Range Status   Specimen Description   Final    BLOOD LEFT  ANTECUBITAL Performed at [facility], [address] [place], [place] [address]    Special Requests  Final    BOTTLES DRAWN AEROBIC AND ANAEROBIC Blood Culture results may not be optimal due to an inadequate volume of blood received in culture bottles Performed at [facility], [address] [place], [place] [address]    Culture   Final    NO GROWTH 5 DAYS Performed at [facility], [address], [place], [place] [address]    Report Status 05/20/2023 FINAL  Final  Blood Culture (routine x 2)     Status: None   Collection Time: 05/14/23  9:50 PM   Specimen: BLOOD RIGHT ARM  Result Value Ref Range Status   Specimen Description   Final    BLOOD RIGHT ARM Performed at [facility], [address], [place], [place] [address]    Special Requests   Final    BOTTLES DRAWN AEROBIC AND ANAEROBIC Blood Culture results may not be optimal due to an inadequate volume of blood received in culture bottles Performed at [facility], [address] [place], [place] [address]    Culture   Final    NO GROWTH 5 DAYS Performed at [facility], [address] [place], [place] [address]    Report Status 05/20/2023 FINAL  Final  Resp panel by RT-PCR (RSV, Flu A&B, Covid) Anterior Nasal Swab     Status: None   Collection Time: 05/14/23 10:00 PM   Specimen: Anterior Nasal Swab  Result Value Ref Range Status   SARS Coronavirus 2 by RT PCR NEGATIVE NEGATIVE Final    Comment: (NOTE) SARS-CoV-2 target nucleic acids are NOT DETECTED.  The SARS-CoV-2 RNA is generally detectable in upper respiratory specimens during the acute phase of infection. The lowest concentration of SARS-CoV-2 viral copies this assay can detect is 138 copies/mL. A negative result does not preclude SARS-Cov-2 infection and should not be used as the sole basis for treatment or other patient management decisions. A negative result may occur  with  improper specimen collection/handling, submission of specimen other than nasopharyngeal swab, presence of viral mutation(s) within the areas targeted by this assay, and inadequate number of viral copies(<138 copies/mL). A negative result must be combined with clinical observations, patient history, and epidemiological information. The expected result is Negative.  Fact Sheet for Patients:  BloggerCourse.com  Fact Sheet for Healthcare Providers:  SeriousBroker.it  This test is no t yet approved or cleared by the [place] FDA and  has been authorized for detection and/or diagnosis of SARS-CoV-2 by FDA under an Emergency Use Authorization (EUA). This EUA will remain  in effect (meaning this test can be used) for the duration of the COVID-19 declaration under Section 564(b)(1) of the Act, 21 U.S.C.section 360bbb-3(b)(1), unless the authorization is terminated  or revoked sooner.       Influenza A by PCR NEGATIVE NEGATIVE Final   Influenza B by PCR NEGATIVE NEGATIVE Final    Comment: (NOTE) The Xpert Xpress SARS-CoV-2/FLU/RSV plus assay is intended as an aid in the diagnosis of influenza from Nasopharyngeal swab specimens and should not be used as a sole basis for treatment. Nasal washings and aspirates are unacceptable for Xpert Xpress SARS-CoV-2/FLU/RSV testing.  Fact Sheet for Patients: BloggerCourse.com  Fact Sheet for Healthcare Providers: SeriousBroker.it  This test is not yet approved or cleared by the [place] FDA and has been authorized for detection and/or diagnosis of SARS-CoV-2 by FDA under an Emergency Use Authorization (EUA). This EUA will remain in effect (meaning this test can be used)  for the duration of the COVID-19 declaration under Section 564(b)(1) of the Act, 21 U.S.C. section 360bbb-3(b)(1), unless the authorization is terminated  or revoked.     Resp Syncytial Virus by PCR NEGATIVE NEGATIVE Final    Comment: (NOTE) Fact Sheet for Patients: BloggerCourse.com  Fact Sheet for Healthcare Providers: SeriousBroker.it  This test is not yet approved or cleared by the [place] FDA and has been authorized for detection and/or diagnosis of SARS-CoV-2 by FDA under an Emergency Use Authorization (EUA). This EUA will remain in effect (meaning this test can be used) for the duration of the COVID-19 declaration under Section 564(b)(1) of the Act, 21 U.S.C. section 360bbb-3(b)(1), unless the authorization is terminated or revoked.  Performed at [facility], [address] [place], [place] [address]   Urine Culture     Status: Abnormal   Collection Time: 05/15/23 12:29 AM   Specimen: Urine, Random  Result Value Ref Range Status   Specimen Description   Final    URINE, RANDOM Performed at [facility], [address] [place], [place] [address]    Special Requests   Final    NONE Reflexed from [id number] Performed at [facility], [address] [place], [place] [address]    Culture (A)  Final    >=100,000 COLONIES/mL KLEBSIELLA PNEUMONIAE Confirmed Extended Spectrum Beta-Lactamase Producer (ESBL).  In bloodstream infections from ESBL organisms, carbapenems are preferred over piperacillin/tazobactam. They are shown to have a lower risk of mortality.    Report Status 05/17/2023 FINAL  Final   Organism ID, Bacteria KLEBSIELLA PNEUMONIAE (A)  Final      Susceptibility   Klebsiella pneumoniae - MIC*    AMPICILLIN >=32 RESISTANT Resistant     CEFAZOLIN >=64 RESISTANT Resistant     CEFEPIME >=32 RESISTANT Resistant     CEFTRIAXONE >=64 RESISTANT Resistant     CIPROFLOXACIN <=0.25 SENSITIVE Sensitive     GENTAMICIN <=1 SENSITIVE Sensitive     IMIPENEM <=0.25 SENSITIVE Sensitive     NITROFURANTOIN <=16  SENSITIVE Sensitive     TRIMETH/SULFA <=20 SENSITIVE Sensitive     AMPICILLIN/SULBACTAM 16 INTERMEDIATE Intermediate     PIP/TAZO <=4 SENSITIVE Sensitive ug/mL    * >=100,000 COLONIES/mL KLEBSIELLA PNEUMONIAE  MRSA Next Gen by PCR, Nasal     Status: None   Collection Time: 05/15/23  5:40 PM   Specimen: Nasal Mucosa; Nasal Swab  Result Value Ref Range Status   MRSA by PCR Next Gen NOT DETECTED NOT DETECTED Final    Comment: (NOTE) The GeneXpert MRSA Assay (FDA approved for NASAL specimens only), is one component of a comprehensive MRSA colonization surveillance program. It is not intended to diagnose MRSA infection nor to guide or monitor treatment for MRSA infections. Test performance is not FDA approved in patients less than 44 years old. Performed at [facility], [address], [place], [place] [address]   MRSA Next Gen by PCR, Nasal     Status: None   Collection Time: 05/18/23 10:59 AM   Specimen: Nasal Mucosa; Nasal Swab  Result Value Ref Range Status   MRSA by PCR Next Gen NOT DETECTED NOT DETECTED Final    Comment: (NOTE) The GeneXpert MRSA Assay (FDA approved for NASAL specimens only), is one component of a comprehensive MRSA colonization surveillance program. It is not intended to diagnose MRSA infection nor to guide or monitor treatment for MRSA infections. Test performance is not FDA approved in patients less than 2  years old. Performed at [facility], [address] [place], [place] [address]   Culture, blood (Routine X 2) w Reflex to ID Panel     Status: None   Collection Time: 05/18/23  4:56 PM   Specimen: BLOOD  Result Value Ref Range Status   Specimen Description   Final    BLOOD BLOOD RIGHT HAND Performed at [facility], [address], [place], [place] [address]    Special Requests   Final    BOTTLES DRAWN AEROBIC ONLY Blood Culture results may not be optimal due to an inadequate volume of blood  received in culture bottles Performed at [facility], [address] [place], [place] [address]    Culture   Final    NO GROWTH 5 DAYS Performed at [facility], [address], [place], [place] [address]    Report Status 05/23/2023 FINAL  Final  Culture, blood (Routine X 2) w Reflex to ID Panel     Status: None   Collection Time: 05/18/23  5:03 PM   Specimen: BLOOD  Result Value Ref Range Status   Specimen Description   Final    BLOOD BLOOD RIGHT HAND Performed at [facility], [address] [place], [place] [address]    Special Requests   Final    BOTTLES DRAWN AEROBIC ONLY Blood Culture results may not be optimal due to an inadequate volume of blood received in culture bottles Performed at [facility], [address] [place], [place] [address]    Culture   Final    NO GROWTH 5 DAYS Performed at [facility], [address], [place], [place] [address]    Report Status 05/23/2023 FINAL  Final     Labs: BNP (last 3 results) Recent Labs    05/18/23 1815 05/21/23 0407  BNP 919.6* 710.7*   Basic Metabolic Panel: Recent Labs  Lab 05/19/23 0638 05/20/23 0012 05/21/23 0407 05/22/23 0609 05/23/23 0631  NA 131* 130* 135 139 138  K 3.5 3.6 3.4* 3.8 4.2  CL 96* 96* 101 101 100  CO2 23 25 25 27 28   GLUCOSE 117* 181* 169* 165* 160*  BUN 38* 31* 24* 16 17  CREATININE 1.37* 1.37* 1.07* 0.88 1.06*  CALCIUM 8.2* 8.1* 8.1* 8.8* 8.9  MG  --  2.8*  --  2.2  --   PHOS  --  2.5  --   --   --    Liver Function Tests: Recent Labs  Lab 05/23/23 0642  AST 17  ALT 13  ALKPHOS 47  BILITOT 0.2  PROT 6.8  ALBUMIN 2.8*   No results for input(s): "LIPASE", "AMYLASE" in the last 168 hours. No results for input(s): "AMMONIA" in the last 168 hours. CBC: Recent Labs  Lab 05/19/23 0515 05/20/23 0012 05/21/23 0407 05/22/23 0609 05/23/23 0631  WBC 12.0* 14.6* 10.2 9.0 8.7  HGB 9.7* 9.4* 9.8* 10.4* 10.9*   HCT 29.5* 28.6* 31.5* 33.4* 34.5*  MCV 92.8 92.3 96.9 95.4 95.0  PLT 206 225 191 258 259   Cardiac Enzymes: No results for input(s): "CKTOTAL", "CKMB", "CKMBINDEX", "TROPONINI" in the last 168 hours. BNP: Invalid input(s): "POCBNP" CBG: Recent Labs  Lab 05/23/23 0820 05/23/23 1200 05/23/23 1638 05/23/23 2146 05/24/23 0735  GLUCAP 152* 191* 165* 188* 198*   D-Dimer No results for input(s): "DDIMER" in the last 72 hours. Hgb A1c No results for input(s): "HGBA1C" in the last 72  hours. Lipid Profile No results for input(s): "CHOL", "HDL", "LDLCALC", "TRIG", "CHOLHDL", "LDLDIRECT" in the last 72 hours. Thyroid function studies No results for input(s): "TSH", "T4TOTAL", "T3FREE", "THYROIDAB" in the last 72 hours.  Invalid input(s): "FREET3" Anemia work up No results for input(s): "VITAMINB12", "FOLATE", "FERRITIN", "TIBC", "IRON", "RETICCTPCT" in the last 72 hours. Urinalysis    Component Value Date/Time   COLORURINE YELLOW 05/17/2023 1320   APPEARANCEUR CLOUDY (A) 05/17/2023 1320   LABSPEC 1.013 05/17/2023 1320   PHURINE 5.0 05/17/2023 1320   GLUCOSEU NEGATIVE 05/17/2023 1320   HGBUR SMALL (A) 05/17/2023 1320   BILIRUBINUR NEGATIVE 05/17/2023 1320   KETONESUR NEGATIVE 05/17/2023 1320   PROTEINUR 30 (A) 05/17/2023 1320   NITRITE NEGATIVE 05/17/2023 1320   LEUKOCYTESUR LARGE (A) 05/17/2023 1320   Sepsis Labs Recent Labs  Lab 05/20/23 0012 05/21/23 0407 05/22/23 0609 05/23/23 0631  WBC 14.6* 10.2 9.0 8.7   Microbiology Recent Results (from the past 240 hours)  Blood Culture (routine x 2)     Status: None   Collection Time: 05/14/23  9:22 PM   Specimen: BLOOD  Result Value Ref Range Status   Specimen Description   Final    BLOOD LEFT ANTECUBITAL Performed at [facility], [address] [place], [place] [address]    Special Requests   Final    BOTTLES DRAWN AEROBIC AND ANAEROBIC Blood Culture results may not be optimal due to an inadequate  volume of blood received in culture bottles Performed at [facility], [address] [place], [place] [address]    Culture   Final    NO GROWTH 5 DAYS Performed at [facility], [address], [place], [place] [address]    Report Status 05/20/2023 FINAL  Final  Blood Culture (routine x 2)     Status: None   Collection Time: 05/14/23  9:50 PM   Specimen: BLOOD RIGHT ARM  Result Value Ref Range Status   Specimen Description   Final    BLOOD RIGHT ARM Performed at [facility], [address] [place], [place] [address]    Special Requests   Final    BOTTLES DRAWN AEROBIC AND ANAEROBIC Blood Culture results may not be optimal due to an inadequate volume of blood received in culture bottles Performed at [facility], [address], [place], [place] [address]    Culture   Final    NO GROWTH 5 DAYS Performed at [facility], [address], [place], [place] [address]    Report Status 05/20/2023 FINAL  Final  Resp panel by RT-PCR (RSV, Flu A&B, Covid) Anterior Nasal Swab     Status: None   Collection Time: 05/14/23 10:00 PM   Specimen: Anterior Nasal Swab  Result Value Ref Range Status   SARS Coronavirus 2 by RT PCR NEGATIVE NEGATIVE Final    Comment: (NOTE) SARS-CoV-2 target nucleic acids are NOT DETECTED.  The SARS-CoV-2 RNA is generally detectable in upper respiratory specimens during the acute phase of infection. The lowest concentration of SARS-CoV-2 viral copies this assay can detect is 138 copies/mL. A negative result does not preclude SARS-Cov-2 infection and should not be used as the sole basis for treatment or other patient management decisions. A negative result may occur with  improper specimen collection/handling, submission of specimen other than nasopharyngeal swab, presence of viral mutation(s) within the areas targeted by this assay, and inadequate number of viral copies(<138 copies/mL). A  negative result  must be combined with clinical observations, patient history, and epidemiological information. The expected result is Negative.  Fact Sheet for Patients:  BloggerCourse.com  Fact Sheet for Healthcare Providers:  SeriousBroker.it  This test is no t yet approved or cleared by the [place] FDA and  has been authorized for detection and/or diagnosis of SARS-CoV-2 by FDA under an Emergency Use Authorization (EUA). This EUA will remain  in effect (meaning this test can be used) for the duration of the COVID-19 declaration under Section 564(b)(1) of the Act, 21 U.S.C.section 360bbb-3(b)(1), unless the authorization is terminated  or revoked sooner.       Influenza A by PCR NEGATIVE NEGATIVE Final   Influenza B by PCR NEGATIVE NEGATIVE Final    Comment: (NOTE) The Xpert Xpress SARS-CoV-2/FLU/RSV plus assay is intended as an aid in the diagnosis of influenza from Nasopharyngeal swab specimens and should not be used as a sole basis for treatment. Nasal washings and aspirates are unacceptable for Xpert Xpress SARS-CoV-2/FLU/RSV testing.  Fact Sheet for Patients: BloggerCourse.com  Fact Sheet for Healthcare Providers: SeriousBroker.it  This test is not yet approved or cleared by the [place] FDA and has been authorized for detection and/or diagnosis of SARS-CoV-2 by FDA under an Emergency Use Authorization (EUA). This EUA will remain in effect (meaning this test can be used) for the duration of the COVID-19 declaration under Section 564(b)(1) of the Act, 21 U.S.C. section 360bbb-3(b)(1), unless the authorization is terminated or revoked.     Resp Syncytial Virus by PCR NEGATIVE NEGATIVE Final    Comment: (NOTE) Fact Sheet for Patients: BloggerCourse.com  Fact Sheet for Healthcare  Providers: SeriousBroker.it  This test is not yet approved or cleared by the [place] FDA and has been authorized for detection and/or diagnosis of SARS-CoV-2 by FDA under an Emergency Use Authorization (EUA). This EUA will remain in effect (meaning this test can be used) for the duration of the COVID-19 declaration under Section 564(b)(1) of the Act, 21 U.S.C. section 360bbb-3(b)(1), unless the authorization is terminated or revoked.  Performed at [facility], [address], [place], [place] [address]   Urine Culture     Status: Abnormal   Collection Time: 05/15/23 12:29 AM   Specimen: Urine, Random  Result Value Ref Range Status   Specimen Description   Final    URINE, RANDOM Performed at [facility], [address], [place], [place] [address]    Special Requests   Final    NONE Reflexed from [id number] Performed at [facility], [address], [place], [place] [address]    Culture (A)  Final    >=100,000 COLONIES/mL KLEBSIELLA PNEUMONIAE Confirmed Extended Spectrum Beta-Lactamase Producer (ESBL).  In bloodstream infections from ESBL organisms, carbapenems are preferred over piperacillin/tazobactam. They are shown to have a lower risk of mortality.    Report Status 05/17/2023 FINAL  Final   Organism ID, Bacteria KLEBSIELLA PNEUMONIAE (A)  Final      Susceptibility   Klebsiella pneumoniae - MIC*    AMPICILLIN >=32 RESISTANT Resistant     CEFAZOLIN >=64 RESISTANT Resistant     CEFEPIME >=32 RESISTANT Resistant     CEFTRIAXONE >=64 RESISTANT Resistant     CIPROFLOXACIN <=0.25 SENSITIVE Sensitive     GENTAMICIN <=1 SENSITIVE Sensitive     IMIPENEM <=0.25 SENSITIVE Sensitive     NITROFURANTOIN <=16 SENSITIVE Sensitive     TRIMETH/SULFA <=20 SENSITIVE Sensitive     AMPICILLIN/SULBACTAM 16 INTERMEDIATE Intermediate  PIP/TAZO <=4 SENSITIVE Sensitive ug/mL    * >=100,000 COLONIES/mL  KLEBSIELLA PNEUMONIAE  MRSA Next Gen by PCR, Nasal     Status: None   Collection Time: 05/15/23  5:40 PM   Specimen: Nasal Mucosa; Nasal Swab  Result Value Ref Range Status   MRSA by PCR Next Gen NOT DETECTED NOT DETECTED Final    Comment: (NOTE) The GeneXpert MRSA Assay (FDA approved for NASAL specimens only), is one component of a comprehensive MRSA colonization surveillance program. It is not intended to diagnose MRSA infection nor to guide or monitor treatment for MRSA infections. Test performance is not FDA approved in patients less than 54 years old. Performed at [facility], [address] [place], [place] [address]   MRSA Next Gen by PCR, Nasal     Status: None   Collection Time: 05/18/23 10:59 AM   Specimen: Nasal Mucosa; Nasal Swab  Result Value Ref Range Status   MRSA by PCR Next Gen NOT DETECTED NOT DETECTED Final    Comment: (NOTE) The GeneXpert MRSA Assay (FDA approved for NASAL specimens only), is one component of a comprehensive MRSA colonization surveillance program. It is not intended to diagnose MRSA infection nor to guide or monitor treatment for MRSA infections. Test performance is not FDA approved in patients less than 59 years old. Performed at [facility], [address] [place], [place] [address]   Culture, blood (Routine X 2) w Reflex to ID Panel     Status: None   Collection Time: 05/18/23  4:56 PM   Specimen: BLOOD  Result Value Ref Range Status   Specimen Description   Final    BLOOD BLOOD RIGHT HAND Performed at [facility], [address] [place], [place] [address]    Special Requests   Final    BOTTLES DRAWN AEROBIC ONLY Blood Culture results may not be optimal due to an inadequate volume of blood received in culture bottles Performed at [facility], [address] [place], [place] [address]    Culture   Final    NO GROWTH 5 DAYS Performed at [facility], [address], [place], [place] [address]    Report Status 05/23/2023 FINAL  Final  Culture, blood (Routine X 2) w Reflex to ID Panel     Status: None   Collection Time: 05/18/23  5:03 PM   Specimen: BLOOD  Result Value Ref Range Status   Specimen Description   Final    BLOOD BLOOD RIGHT HAND Performed at [facility], [address], [place], [place] [address]    Special Requests   Final    BOTTLES DRAWN AEROBIC ONLY Blood Culture results may not be optimal due to an inadequate volume of blood received in culture bottles Performed at [facility], [address], [place], [place] [address]    Culture   Final    NO GROWTH 5 DAYS Performed at [facility], [address] [place], [place] [address]    Report Status 05/23/2023 FINAL  Final     Time coordinating discharge: Over 30 minutes  SIGNED:   Alvira Philips [place], DO  [facility] 05/24/2023, 10:49 AM

## 2023-05-24 NOTE — Plan of Care (Signed)
  Problem: Education: Goal: Ability to describe self-care measures that may prevent or decrease complications (Diabetes Survival Skills Education) will improve Outcome: Progressing Goal: Individualized Educational Video(s) Outcome: Progressing   Problem: Coping: Goal: Ability to adjust to condition or change in health will improve Outcome: Progressing   Problem: Fluid Volume: Goal: Ability to maintain a balanced intake and output will improve Outcome: Progressing   Problem: Health Behavior/Discharge Planning: Goal: Ability to identify and utilize available resources and services will improve Outcome: Progressing Goal: Ability to manage health-related needs will improve Outcome: Progressing   Problem: Metabolic: Goal: Ability to maintain appropriate glucose levels will improve Outcome: Progressing   Problem: Nutritional: Goal: Maintenance of adequate nutrition will improve Outcome: Progressing Goal: Progress toward achieving an optimal weight will improve Outcome: Progressing   Problem: Skin Integrity: Goal: Risk for impaired skin integrity will decrease Outcome: Progressing   Problem: Tissue Perfusion: Goal: Adequacy of tissue perfusion will improve Outcome: Progressing   Problem: Education: Goal: Knowledge of General Education information will improve Description: Including pain rating scale, medication(s)/side effects and non-pharmacologic comfort measures Outcome: Progressing   Problem: Health Behavior/Discharge Planning: Goal: Ability to manage health-related needs will improve Outcome: Progressing   Problem: Clinical Measurements: Goal: Ability to maintain clinical measurements within normal limits will improve Outcome: Progressing Goal: Will remain free from infection Outcome: Progressing Goal: Diagnostic test results will improve Outcome: Progressing Goal: Respiratory complications will improve Outcome: Progressing Goal: Cardiovascular complication will  be avoided Outcome: Progressing   Problem: Activity: Goal: Risk for activity intolerance will decrease Outcome: Progressing   Problem: Nutrition: Goal: Adequate nutrition will be maintained Outcome: Progressing   Problem: Coping: Goal: Level of anxiety will decrease Outcome: Progressing   Problem: Elimination: Goal: Will not experience complications related to bowel motility Outcome: Progressing   Problem: Pain Managment: Goal: General experience of comfort will improve and/or be controlled Outcome: Progressing   Problem: Safety: Goal: Ability to remain free from injury will improve Outcome: Progressing   Problem: Skin Integrity: Goal: Risk for impaired skin integrity will decrease Outcome: Progressing   Problem: Activity: Goal: Ability to tolerate increased activity will improve Outcome: Progressing   Problem: Clinical Measurements: Goal: Ability to maintain a body temperature in the normal range will improve Outcome: Progressing   Problem: Respiratory: Goal: Ability to maintain adequate ventilation will improve Outcome: Progressing Goal: Ability to maintain a clear airway will improve Outcome: Progressing

## 2023-05-24 NOTE — [place] FL2 (Signed)
 [place] MEDICAID FL2 LEVEL OF CARE FORM     IDENTIFICATION  Patient Name: Olivia Burnett Birthdate: [birth year] Sex: female Admission Date (Current Location): 05/14/2023  [place] and [place] Number:  [facility] [id number] [facility] Facility and Address:  [facility],  [address], [place] [address]      Provider Number: [id number]  Attending Physician Name and Address:  [place], Eric J, DO  Relative Name and Phone Number:  Amy [phone])    Current Level of Care: Hospital Recommended Level of Care: Nursing Facility, Other (Comment) (LTC) Prior Approval Number:    Date Approved/Denied:   PASRR Number: [id number]  Discharge Plan: Other (Comment) (LTC([facility]))    Current Diagnoses: Patient Active Problem List   Diagnosis Date Noted   UTI (urinary tract infection) 05/15/2023   History of stroke 05/15/2023   Essential hypertension 05/15/2023   Thrombocytopenia (HCC) 05/15/2023   CAD S/P percutaneous coronary angioplasty 05/15/2023   Chronic ITP (idiopathic thrombocytopenia) (HCC) 05/15/2023   Psoriatic arthritis (HCC) 05/15/2023   Multifocal pneumonia 05/15/2023   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)    Seizures (HCC)    Stroke-like episode 02/11/2023   Acute left-sided weakness 02/09/2023   CKD stage 3a, GFR 45-59 ml/min (HCC) 02/09/2023   Elevated serum creatinine 02/09/2023    Orientation RESPIRATION BLADDER Height & Weight     Time, Situation, Place, Self  Normal Incontinent Weight: 72.4 kg Height:  5\' 5"  (165.1 cm)  BEHAVIORAL SYMPTOMS/MOOD NEUROLOGICAL BOWEL NUTRITION STATUS      Incontinent Diet (CHO MOD)  AMBULATORY STATUS COMMUNICATION OF NEEDS Skin   Extensive Assist Verbally Normal                       Personal Care Assistance Level of Assistance  Bathing, Feeding, Dressing Bathing Assistance: Maximum assistance Feeding assistance: Limited assistance Dressing Assistance: Maximum  assistance     Functional Limitations Info  Sight, Hearing, Speech Sight Info: Adequate Hearing Info: Adequate Speech Info: Adequate    SPECIAL CARE FACTORS FREQUENCY                       Contractures Contractures Info: Not present    Additional Factors Info  Code Status, Allergies Code Status Info: DNR Allergies Info: Ciprofloxacin, Gemifloxacin, Infliximab, Levaquin (Levofloxacin), Meperidine, Mercury, Moxifloxacin, Ofloxacin, Penicillins, Phenobarbital, Pregabalin, Prochlorperazine Psychotropic Info: See discharge summary         Current Medications (05/24/2023):  This is the current hospital active medication list Current Facility-Administered Medications  Medication Dose Route Frequency Provider Last Rate Last Admin   acetaminophen (TYLENOL) tablet 650 mg  650 mg Oral Q6H PRN Regalado, Belkys A, MD   650 mg at 05/23/23 2241   Or   acetaminophen (TYLENOL) suppository 650 mg  650 mg Rectal Q6H PRN Regalado, Belkys A, MD       amLODipine (NORVASC) tablet 10 mg  10 mg Oral Daily [place], Alvira Philips, DO   10 mg at 05/24/23 7253   apixaban (ELIQUIS) tablet 5 mg  5 mg Oral BID Pricilla Riffle, RPH   5 mg at 05/24/23 6644   aspirin EC tablet 81 mg  81 mg Oral Daily Regalado, Belkys A, MD   81 mg at 05/24/23 0909   atorvastatin (LIPITOR) tablet 10 mg  10 mg Oral QHS Regalado, Belkys A, MD   10 mg at 05/23/23 2157   bisacodyl (DULCOLAX) EC tablet 5 mg  5 mg Oral Daily PRN Regalado, Belkys A, MD       Chlorhexidine Gluconate Cloth 2 % PADS 6 each  6 each Topical Daily Regalado, Belkys A, MD   6 each at 05/23/23 1033   gabapentin (NEURONTIN) capsule 200 mg  200 mg Oral BID Regalado, Belkys A, MD   200 mg at 05/24/23 0908   guaiFENesin (ROBITUSSIN) 100 MG/5ML liquid 5 mL  5 mL Oral Q4H PRN Regalado, Belkys A, MD       hydroxychloroquine (PLAQUENIL) tablet 200 mg  200 mg Oral Daily Regalado, Belkys A, MD   200 mg at 05/24/23 0909   insulin aspart (novoLOG) injection 0-5 Units  0-5  Units Subcutaneous QHS Regalado, Belkys A, MD   2 Units at 05/20/23 2158   insulin aspart (novoLOG) injection 0-9 Units  0-9 Units Subcutaneous TID WC Regalado, Belkys A, MD   2 Units at 05/24/23 1204   insulin glargine (LANTUS) injection 15 Units  15 Units Subcutaneous QHS Regalado, Belkys A, MD   15 Units at 05/23/23 2157   isosorbide mononitrate (IMDUR) 24 hr tablet 30 mg  30 mg Oral Daily Regalado, Belkys A, MD   30 mg at 05/24/23 0908   linaclotide (LINZESS) capsule 145 mcg  145 mcg Oral Daily Regalado, Belkys A, MD   145 mcg at 05/24/23 0909   liver oil-zinc oxide (DESITIN) 40 % ointment   Topical BID PRN [place], Eric J, DO       melatonin tablet 3 mg  3 mg Oral QHS PRN Regalado, Belkys A, MD   3 mg at 05/24/23 0121   metoprolol tartrate (LOPRESSOR) tablet 50 mg  50 mg Oral BID [place], Eric J, DO   50 mg at 05/24/23 0908   nitroGLYCERIN (NITROSTAT) SL tablet 0.4 mg  0.4 mg Sublingual Q5 min PRN Regalado, Belkys A, MD   0.4 mg at 05/19/23 1041   ondansetron (ZOFRAN) injection 4 mg  4 mg Intravenous Q6H PRN Regalado, Belkys A, MD   4 mg at 05/22/23 1833   Oral care mouth rinse  15 mL Mouth Rinse PRN Regalado, Belkys A, MD       pantoprazole (PROTONIX) EC tablet 40 mg  40 mg Oral Daily Regalado, Belkys A, MD   40 mg at 05/24/23 0907   polyethylene glycol (MIRALAX / GLYCOLAX) packet 17 g  17 g Oral Daily PRN Regalado, Belkys A, MD       sertraline (ZOLOFT) tablet 75 mg  75 mg Oral Daily Regalado, Belkys A, MD   75 mg at 05/24/23 0908   valproic acid (DEPAKENE) 250 MG capsule 500 mg  500 mg Oral TID Regalado, Belkys A, MD   500 mg at 05/24/23 0909     Discharge Medications: Please see discharge summary for a list of discharge medications.  Relevant Imaging Results:  Relevant Lab Results:   Additional Information SS#[id number]  Nasir Bright, Olegario Messier, RN

## 2023-05-24 NOTE — Plan of Care (Signed)
 Problem: Education: Goal: Ability to describe self-care measures that may prevent or decrease complications (Diabetes Survival Skills Education) will improve 05/24/2023 1400 by Westley Foots, RN Outcome: Adequate for Discharge 05/24/2023 1037 by Westley Foots, RN Outcome: Progressing Goal: Individualized Educational Video(s) 05/24/2023 1400 by Westley Foots, RN Outcome: Adequate for Discharge 05/24/2023 1037 by Westley Foots, RN Outcome: Progressing   Problem: Coping: Goal: Ability to adjust to condition or change in health will improve 05/24/2023 1400 by Westley Foots, RN Outcome: Adequate for Discharge 05/24/2023 1037 by Westley Foots, RN Outcome: Progressing   Problem: Fluid Volume: Goal: Ability to maintain a balanced intake and output will improve 05/24/2023 1400 by Westley Foots, RN Outcome: Adequate for Discharge 05/24/2023 1037 by Westley Foots, RN Outcome: Progressing   Problem: Health Behavior/Discharge Planning: Goal: Ability to identify and utilize available resources and services will improve 05/24/2023 1400 by Westley Foots, RN Outcome: Adequate for Discharge 05/24/2023 1037 by Westley Foots, RN Outcome: Progressing Goal: Ability to manage health-related needs will improve 05/24/2023 1400 by Westley Foots, RN Outcome: Adequate for Discharge 05/24/2023 1037 by Westley Foots, RN Outcome: Progressing   Problem: Metabolic: Goal: Ability to maintain appropriate glucose levels will improve 05/24/2023 1400 by Westley Foots, RN Outcome: Adequate for Discharge 05/24/2023 1037 by Westley Foots, RN Outcome: Progressing   Problem: Nutritional: Goal: Maintenance of adequate nutrition will improve 05/24/2023 1400 by Westley Foots, RN Outcome: Adequate for Discharge 05/24/2023 1037 by Westley Foots, RN Outcome: Progressing Goal: Progress toward achieving an optimal  weight will improve 05/24/2023 1400 by Westley Foots, RN Outcome: Adequate for Discharge 05/24/2023 1037 by Westley Foots, RN Outcome: Progressing   Problem: Skin Integrity: Goal: Risk for impaired skin integrity will decrease 05/24/2023 1400 by Westley Foots, RN Outcome: Adequate for Discharge 05/24/2023 1037 by Westley Foots, RN Outcome: Progressing   Problem: Tissue Perfusion: Goal: Adequacy of tissue perfusion will improve 05/24/2023 1400 by Westley Foots, RN Outcome: Adequate for Discharge 05/24/2023 1037 by Westley Foots, RN Outcome: Progressing   Problem: Education: Goal: Knowledge of General Education information will improve Description: Including pain rating scale, medication(s)/side effects and non-pharmacologic comfort measures 05/24/2023 1400 by Westley Foots, RN Outcome: Adequate for Discharge 05/24/2023 1037 by Westley Foots, RN Outcome: Progressing   Problem: Health Behavior/Discharge Planning: Goal: Ability to manage health-related needs will improve 05/24/2023 1400 by Westley Foots, RN Outcome: Adequate for Discharge 05/24/2023 1037 by Westley Foots, RN Outcome: Progressing   Problem: Clinical Measurements: Goal: Ability to maintain clinical measurements within normal limits will improve 05/24/2023 1400 by Westley Foots, RN Outcome: Adequate for Discharge 05/24/2023 1037 by Westley Foots, RN Outcome: Progressing Goal: Will remain free from infection 05/24/2023 1400 by Westley Foots, RN Outcome: Adequate for Discharge 05/24/2023 1037 by Westley Foots, RN Outcome: Progressing Goal: Diagnostic test results will improve 05/24/2023 1400 by Westley Foots, RN Outcome: Adequate for Discharge 05/24/2023 1037 by Westley Foots, RN Outcome: Progressing Goal: Respiratory complications will improve 05/24/2023 1400 by Westley Foots, RN Outcome: Adequate for  Discharge 05/24/2023 1037 by Westley Foots, RN Outcome: Progressing Goal: Cardiovascular complication will be avoided 05/24/2023 1400 by Westley Foots, RN Outcome: Adequate for Discharge 05/24/2023 1037 by Westley Foots, RN Outcome: Progressing   Problem: Activity: Goal: Risk for activity intolerance will decrease 05/24/2023 1400 by Westley Foots, RN Outcome: Adequate for Discharge  05/24/2023 1037 by Westley Foots, RN Outcome: Progressing   Problem: Nutrition: Goal: Adequate nutrition will be maintained 05/24/2023 1400 by Westley Foots, RN Outcome: Adequate for Discharge 05/24/2023 1037 by Westley Foots, RN Outcome: Progressing   Problem: Coping: Goal: Level of anxiety will decrease 05/24/2023 1400 by Westley Foots, RN Outcome: Adequate for Discharge 05/24/2023 1037 by Westley Foots, RN Outcome: Progressing   Problem: Elimination: Goal: Will not experience complications related to bowel motility 05/24/2023 1400 by Westley Foots, RN Outcome: Adequate for Discharge 05/24/2023 1037 by Westley Foots, RN Outcome: Progressing   Problem: Pain Managment: Goal: General experience of comfort will improve and/or be controlled 05/24/2023 1400 by Westley Foots, RN Outcome: Adequate for Discharge 05/24/2023 1037 by Westley Foots, RN Outcome: Progressing   Problem: Safety: Goal: Ability to remain free from injury will improve 05/24/2023 1400 by Westley Foots, RN Outcome: Adequate for Discharge 05/24/2023 1037 by Westley Foots, RN Outcome: Progressing   Problem: Skin Integrity: Goal: Risk for impaired skin integrity will decrease 05/24/2023 1400 by Westley Foots, RN Outcome: Adequate for Discharge 05/24/2023 1037 by Westley Foots, RN Outcome: Progressing   Problem: Activity: Goal: Ability to tolerate increased activity will improve 05/24/2023 1400 by Westley Foots,  RN Outcome: Adequate for Discharge 05/24/2023 1037 by Westley Foots, RN Outcome: Progressing   Problem: Clinical Measurements: Goal: Ability to maintain a body temperature in the normal range will improve 05/24/2023 1400 by Westley Foots, RN Outcome: Adequate for Discharge 05/24/2023 1037 by Westley Foots, RN Outcome: Progressing   Problem: Respiratory: Goal: Ability to maintain adequate ventilation will improve 05/24/2023 1400 by Westley Foots, RN Outcome: Adequate for Discharge 05/24/2023 1037 by Westley Foots, RN Outcome: Progressing Goal: Ability to maintain a clear airway will improve 05/24/2023 1400 by Westley Foots, RN Outcome: Adequate for Discharge 05/24/2023 1037 by Westley Foots, RN Outcome: Progressing

## 2023-06-09 ENCOUNTER — Inpatient Hospital Stay: Admitting: Internal Medicine

## 2023-06-21 ENCOUNTER — Inpatient Hospital Stay: Admitting: Internal Medicine

## 2023-06-24 DEATH — deceased
